# Patient Record
Sex: Female | Born: 1950 | Race: Black or African American | Hispanic: No | Marital: Married | Smoking: Never smoker
Health system: Southern US, Community
[De-identification: ages and names within clinical notes are randomized; demographics above are authoritative.]

## PROBLEM LIST (undated history)

## (undated) DIAGNOSIS — N201 Calculus of ureter: Secondary | ICD-10-CM

## (undated) DIAGNOSIS — Z903 Acquired absence of stomach [part of]: Secondary | ICD-10-CM

## (undated) DIAGNOSIS — Z923 Personal history of irradiation: Secondary | ICD-10-CM

## (undated) DIAGNOSIS — C50919 Malignant neoplasm of unspecified site of unspecified female breast: Secondary | ICD-10-CM

## (undated) DIAGNOSIS — K219 Gastro-esophageal reflux disease without esophagitis: Secondary | ICD-10-CM

## (undated) DIAGNOSIS — Z86010 Personal history of colonic polyps: Secondary | ICD-10-CM

## (undated) DIAGNOSIS — K449 Diaphragmatic hernia without obstruction or gangrene: Secondary | ICD-10-CM

## (undated) DIAGNOSIS — Z8619 Personal history of other infectious and parasitic diseases: Secondary | ICD-10-CM

## (undated) DIAGNOSIS — Z803 Family history of malignant neoplasm of breast: Secondary | ICD-10-CM

## (undated) DIAGNOSIS — R232 Flushing: Secondary | ICD-10-CM

## (undated) DIAGNOSIS — I10 Essential (primary) hypertension: Secondary | ICD-10-CM

## (undated) DIAGNOSIS — Z860101 Personal history of adenomatous and serrated colon polyps: Secondary | ICD-10-CM

## (undated) DIAGNOSIS — Z973 Presence of spectacles and contact lenses: Secondary | ICD-10-CM

## (undated) HISTORY — DX: Flushing: R23.2

## (undated) HISTORY — DX: Essential (primary) hypertension: I10

## (undated) HISTORY — DX: Gastro-esophageal reflux disease without esophagitis: K21.9

## (undated) HISTORY — PX: COLONOSCOPY WITH ESOPHAGOGASTRODUODENOSCOPY (EGD): SHX5779

## (undated) HISTORY — PX: BREAST BIOPSY: SHX20

## (undated) HISTORY — PX: LUMBAR LAMINECTOMY: SHX95

## (undated) HISTORY — DX: Family history of malignant neoplasm of breast: Z80.3

## (undated) HISTORY — PX: BREAST LUMPECTOMY: SHX2

---

## 2003-11-20 HISTORY — PX: CHOLECYSTECTOMY OPEN: SUR202

## 2004-11-19 DIAGNOSIS — Z903 Acquired absence of stomach [part of]: Secondary | ICD-10-CM

## 2004-11-19 HISTORY — PX: GASTRIC RESECTION: SHX5248

## 2004-11-19 HISTORY — DX: Acquired absence of stomach (part of): Z90.3

## 2010-07-06 HISTORY — PX: TRANSTHORACIC ECHOCARDIOGRAM: SHX275

## 2012-08-18 ENCOUNTER — Ambulatory Visit (INDEPENDENT_AMBULATORY_CARE_PROVIDER_SITE_OTHER): Payer: Managed Care, Other (non HMO) | Admitting: Family Medicine

## 2012-08-18 ENCOUNTER — Encounter: Payer: Self-pay | Admitting: Family Medicine

## 2012-08-18 VITALS — BP 124/80 | Temp 98.9°F | Ht 66.0 in | Wt 160.0 lb

## 2012-08-18 DIAGNOSIS — K219 Gastro-esophageal reflux disease without esophagitis: Secondary | ICD-10-CM

## 2012-08-18 DIAGNOSIS — I10 Essential (primary) hypertension: Secondary | ICD-10-CM

## 2012-08-18 DIAGNOSIS — Z23 Encounter for immunization: Secondary | ICD-10-CM

## 2012-08-18 DIAGNOSIS — M549 Dorsalgia, unspecified: Secondary | ICD-10-CM

## 2012-08-18 NOTE — Patient Instructions (Addendum)
-  We have ordered labs (kidney function)  at this visit. It usually takes 1-2 weeks for results and processing. We will contact you with instructions IF your results are abnormal. Normal results will be released to your [facility] in 1-2 weeks. If you have not heard from [place] or can not find your results in [facility] in 2 weeks please contact our office.  -We placed a referral to the gastroenterologist for you as discussed. It usually takes about 1-2 weeks to process and schedule this referral. If you have not heard from [place] regarding this appointment in 2 weeks please contact our office.  We recommend the following healthy lifestyle measures: - eat a healthy diet consisting of lots of vegetables, fruits, beans, nuts, seeds, healthy meats such as white chicken and fish and whole grains.  - avoid fried foods, fast food, processed foods, sodas, red meet and other fattening foods.  - get a least 150 minutes of aerobic exercise per week.    -please follow up in 1-2 months for your women's physical and pap smear

## 2012-08-18 NOTE — Progress Notes (Signed)
Chief Complaint  Patient presents with  . Establish Care    HPI:   Here to establish care. New to area.  History of stomach surgery for cysts in stomach and GERD and wants to establish care with GI here. -on prilosec for gerd -occ reflux still - feels like nexium worked better but was too expensive -no dysphagia, unexplained weight loss, nausea or vomiting  History of chronic back pain with hx of bulging disks and back surgery. -very active, walks 1-2 miles per day -uses medications very sparingly - ibuprofen and 1/2 tab of norco per week  -no weakness, tingling or numbness  Preventive: needs tdap, needs flu Lipids in March at goal per review of records. Hemoglobin A1c 6.0 Exercises daily and tries to eat healthy   ROS: See pertinent positives and negatives per HPI.  Past Medical History  Diagnosis Date  . GERD (gastroesophageal reflux disease)   . Hypertension   . Ruptured lumbar disc   . Chronic back pain     Family History  Problem Relation Age of Onset  . Cancer Mother     breast cancer  . Stroke Father   . Heart disease Maternal Grandmother     History   Social History  . Marital Status: Single    Spouse Name: N/A    Number of Children: N/A  . Years of Education: N/A   Social History Main Topics  . Smoking status: Never Smoker   . Smokeless tobacco: Never Used  . Alcohol Use: 4.2 oz/week    7 Glasses of wine per week     wine every  night   . Drug Use: None  . Sexually Active: No   Other Topics Concern  . None   Social History Narrative  . None    Current outpatient prescriptions:amLODipine (NORVASC) 5 MG tablet, Take 5 mg by mouth daily. , Disp: , Rfl: ;  Biotin 5000 MCG TABS, Take by mouth daily., Disp: , Rfl: ;  cholecalciferol (VITAMIN D) 1000 UNITS tablet, Take 1,000 Units by mouth daily., Disp: , Rfl: ;  HYDROcodone-acetaminophen (NORCO) 10-325 MG per tablet, Take 1 tablet by mouth every 6 (six) hours as needed., Disp: , Rfl:  losartan  (COZAAR) 50 MG tablet, Take 50 mg by mouth daily. , Disp: , Rfl: ;  naproxen (NAPROSYN) 500 MG tablet, Take 500 mg by mouth 2 (two) times daily with a meal. , Disp: , Rfl: ;  omeprazole (PRILOSEC) 40 MG capsule, Take 40 mg by mouth daily. , Disp: , Rfl:   EXAM:  Filed Vitals:   08/18/12 1609  BP: 124/80  Temp: 98.9 F (37.2 C)    Body mass index is 25.82 kg/(m^2).  GENERAL: vitals reviewed and listed below, alert, oriented, appears well hydrated and in no acute distress  HEENT: atraumatic, conjucntiva clear, no obvious abnormalities on inspection of external nose and ears  NECK: no masses on inspection  LUNGS: clear to auscultation bilaterally, no wheezes, rales or rhonchi, good air movement  CV: HRRR, no peripheral edema  MS: moves all extremities without noticeable abnormality  PSYCH: pleasant and cooperative, no obvious depression or anxiety  ASSESSMENT AND PLAN:  Discussed the following assessment and plan:  1. Hypertension  amLODipine (NORVASC) 5 MG tablet, losartan (COZAAR) 50 MG tablet  2. Back pain  -advised PT, but pt wants to hold off on this -pt reports 1/2 norco per week (15 per 6 months)for bad days - no more, discussed that I am ok with  this occ use, but do not recommend chronic frequent use and if needs escalate will need to refer for further management.   3. GERD (gastroesophageal reflux disease)  Ambulatory referral to Gastroenterology   -flu and tdap today -f/u for pap and preventive visit in 1-2 months (refills on meds that visit) Orders Placed This Encounter  Procedures  . Basic metabolic panel  . Ambulatory referral to Gastroenterology    Referral Priority:  Routine    Referral Type:  Consultation    Referral Reason:  Specialty Services Required    Requested Specialty:  Gastroenterology    Number of Visits Requested:  1    Patient Instructions  -We have ordered labs (kidney function)  at this visit. It usually takes 1-2 weeks for results and  processing. We will contact you with instructions IF your results are abnormal. Normal results will be released to your [facility] in 1-2 weeks. If you have not heard from [place] or can not find your results in [facility] in 2 weeks please contact our office.  -We placed a referral to the gastroenterologist for you as discussed. It usually takes about 1-2 weeks to process and schedule this referral. If you have not heard from [place] regarding this appointment in 2 weeks please contact our office.  We recommend the following healthy lifestyle measures: - eat a healthy diet consisting of lots of vegetables, fruits, beans, nuts, seeds, healthy meats such as white chicken and fish and whole grains.  - avoid fried foods, fast food, processed foods, sodas, red meet and other fattening foods.  - get a least 150 minutes of aerobic exercise per week.    -please follow up in 1-2 months for your women's physical and pap smear              Return to clinic immediately if symptoms worsen or persist or new concerns.  Return in about 1 month (around 09/17/2012) for Physical exam - 30 minutes.  Kriste Basque R.

## 2012-08-19 LAB — BASIC METABOLIC PANEL
CO2: 27 mEq/L (ref 19–32)
Chloride: 106 mEq/L (ref 96–112)
Creatinine, Ser: 0.7 mg/dL (ref 0.4–1.2)
Potassium: 3.4 mEq/L — ABNORMAL LOW (ref 3.5–5.1)

## 2012-08-19 NOTE — Progress Notes (Signed)
Quick Note:  Left a message for return call. ______ 

## 2012-08-21 NOTE — Progress Notes (Signed)
Quick Note:  Left a message for return call. ______ 

## 2012-08-22 NOTE — Progress Notes (Signed)
Quick Note:  Left a message for pt to return call. ______ 

## 2012-09-08 ENCOUNTER — Telehealth: Payer: Self-pay

## 2012-09-08 DIAGNOSIS — I10 Essential (primary) hypertension: Secondary | ICD-10-CM

## 2012-09-08 MED ORDER — OMEPRAZOLE 40 MG PO CPDR: 40.0000 mg | DELAYED_RELEASE_CAPSULE | Freq: Every day | ORAL | Status: DC

## 2012-09-08 NOTE — Telephone Encounter (Signed)
Called and spoke with pt and to is aware.  Pt states she will make a follow up appt with Dr. Selena Batten.  Pt states GI called pt and she is going to return call to set up appt with them directly.

## 2012-09-08 NOTE — Telephone Encounter (Signed)
Ok to refill for 6 months. Have her schedule follow up with me in 3-4 months. Please see if she got appointment with GI she requested at last visit. Thanks.

## 2012-09-08 NOTE — Telephone Encounter (Signed)
Rx sent to pharmacy   

## 2012-09-08 NOTE — Telephone Encounter (Signed)
Pt called to get her lab work results and states that she is out of omeprazole.  According to last office note advised pt to follow up in 1 month for physical exam.  Pt states she had a physical exam in  March 2013 before she left [place].  Pls advise on when pt needs to follow up and if omeprazole can be called in to pharmacy.

## 2012-09-08 NOTE — Addendum Note (Signed)
Addended by: Azucena Freed on: 09/08/2012 11:27 AM   Modules accepted: Orders

## 2012-09-08 NOTE — Progress Notes (Signed)
Quick Note:  Called and spoke with pt and pt is aware. ______ 

## 2012-11-07 ENCOUNTER — Other Ambulatory Visit: Payer: Self-pay | Admitting: Family Medicine

## 2012-11-07 DIAGNOSIS — I10 Essential (primary) hypertension: Secondary | ICD-10-CM

## 2012-11-07 MED ORDER — HYDROCODONE-ACETAMINOPHEN 10-325 MG PO TABS: 1.0000 | ORAL_TABLET | Freq: Four times a day (QID) | ORAL | Status: DC | PRN

## 2012-11-07 MED ORDER — NAPROXEN 500 MG PO TABS: 500.0000 mg | ORAL_TABLET | Freq: Two times a day (BID) | ORAL | Status: DC

## 2012-11-07 NOTE — Addendum Note (Signed)
Addended by: Azucena Freed on: 11/07/2012 04:16 PM   Modules accepted: Orders

## 2012-11-07 NOTE — Telephone Encounter (Signed)
Per Dr. Selena Batten ok to call in #30 of naproxen.  Hydrocodone-acetamin called in to pharmacy #15.

## 2012-11-07 NOTE — Telephone Encounter (Signed)
Pt would like refill of naproxen 500mg  (90 day) and HYDROcodone-acetaminophen 10-325mg . (1 every 6 hrs as needed) Pt has NEW pharm, Pls change all scripts to this: [facility] [address], [place], [place] [phone]

## 2012-11-07 NOTE — Telephone Encounter (Signed)
Pls advise on hydrocodone.  Pt seen 08/18/2012

## 2012-11-07 NOTE — Telephone Encounter (Signed)
Pt reported to me she uses 1/2 tablet not more then 1x per week. Can give her # 15 of her Norco, which should last six months. Also needs follow up in next 2-3 months.

## 2012-12-15 ENCOUNTER — Encounter: Payer: Managed Care, Other (non HMO) | Admitting: Family Medicine

## 2012-12-15 NOTE — Progress Notes (Signed)
No show  This encounter was created in error - please disregard.

## 2012-12-16 ENCOUNTER — Encounter: Payer: Managed Care, Other (non HMO) | Admitting: Family Medicine

## 2012-12-16 NOTE — Progress Notes (Signed)
NO SHOW. This encounter was created in error - please disregard.

## 2013-02-04 ENCOUNTER — Other Ambulatory Visit: Payer: Self-pay | Admitting: Family Medicine

## 2013-02-04 NOTE — Telephone Encounter (Signed)
Pt last seen 08/08/2012. Rx for hydrocodone and naproxen filled on 11/07/12.  Pls advise.

## 2013-02-04 NOTE — Telephone Encounter (Signed)
Ok. Thanks!

## 2013-02-04 NOTE — Telephone Encounter (Signed)
Rx for hydrocodone sent to pharmacy.

## 2013-03-06 ENCOUNTER — Other Ambulatory Visit: Payer: Self-pay | Admitting: Family Medicine

## 2013-03-08 NOTE — Telephone Encounter (Signed)
Needs follow up appt. Then refill until that appt. Thanks.

## 2013-03-09 ENCOUNTER — Encounter: Payer: Self-pay | Admitting: Family Medicine

## 2013-03-09 ENCOUNTER — Telehealth: Payer: Self-pay | Admitting: Family Medicine

## 2013-03-09 DIAGNOSIS — I10 Essential (primary) hypertension: Secondary | ICD-10-CM

## 2013-03-09 MED ORDER — LOSARTAN POTASSIUM 50 MG PO TABS: 50.0000 mg | ORAL_TABLET | Freq: Every day | ORAL | Status: DC

## 2013-03-09 MED ORDER — AMLODIPINE BESYLATE 5 MG PO TABS: 5.0000 mg | ORAL_TABLET | Freq: Every day | ORAL | Status: DC

## 2013-03-09 NOTE — Telephone Encounter (Signed)
Rx sent to pharmacy. Per Dr. Elmyra Ricks request called and spoke with pt and a follow up appt is scheduled for 4/28.

## 2013-03-09 NOTE — Telephone Encounter (Signed)
Patient called stating that she need a refill of her omeprazole 40mg  1poqd, and amlodipine 5 mg 1poqd, lorsartan 50 mg 1poqd [facility] [address] [facility] [place]. Please assist as patient is completely out.

## 2013-03-16 ENCOUNTER — Other Ambulatory Visit: Payer: Self-pay

## 2013-03-16 ENCOUNTER — Ambulatory Visit (INDEPENDENT_AMBULATORY_CARE_PROVIDER_SITE_OTHER): Payer: Managed Care, Other (non HMO) | Admitting: Family Medicine

## 2013-03-16 ENCOUNTER — Encounter: Payer: Self-pay | Admitting: Family Medicine

## 2013-03-16 VITALS — BP 118/80 | Temp 98.2°F | Wt 160.0 lb

## 2013-03-16 DIAGNOSIS — K219 Gastro-esophageal reflux disease without esophagitis: Secondary | ICD-10-CM

## 2013-03-16 DIAGNOSIS — G5602 Carpal tunnel syndrome, left upper limb: Secondary | ICD-10-CM

## 2013-03-16 DIAGNOSIS — Z1231 Encounter for screening mammogram for malignant neoplasm of breast: Secondary | ICD-10-CM

## 2013-03-16 DIAGNOSIS — G5600 Carpal tunnel syndrome, unspecified upper limb: Secondary | ICD-10-CM

## 2013-03-16 DIAGNOSIS — I10 Essential (primary) hypertension: Secondary | ICD-10-CM

## 2013-03-16 MED ORDER — LOSARTAN POTASSIUM 50 MG PO TABS: 50.0000 mg | ORAL_TABLET | Freq: Every day | ORAL | Status: DC

## 2013-03-16 MED ORDER — AMLODIPINE BESYLATE 5 MG PO TABS: 5.0000 mg | ORAL_TABLET | Freq: Every day | ORAL | Status: DC

## 2013-03-16 NOTE — Patient Instructions (Addendum)
For Carpal Tunnel: -wear brace every night and during the day when you can  Schedule mammogram  Call to schedule appointment with the gastroenterologist  Follow up in 3 months for you thumb pain and for physical exam

## 2013-03-16 NOTE — Progress Notes (Signed)
Chief Complaint  Patient presents with  . Follow-up    HPI:  Follow up:  HTN: -on amlodipine and losartan -stable: no complaints -denies: CP, SOB, DOE, palpitations -has healthy diet and get regular exercise  Chronic Back pain: -hx bulging disks and back surgery, [place] and doing well -very active -uses norco 1/2 tab very sparingly for bad days - 15 tabs per 6 months  GERD: -also hx of ? Stomach cysts per her report -she was gong to establish with GI here - but then didn't - she will call them -takes prilosec for GERD  L thumb pain and numbness from time to time: -lots of computer work -symptoms worse at night -no weakness, fevers, other symtoms  ROS: See pertinent positives and negatives per HPI.  Past Medical History  Diagnosis Date  . GERD (gastroesophageal reflux disease)   . Hypertension   . Ruptured lumbar disc   . Chronic back pain     Family History  Problem Relation Age of Onset  . Cancer Mother     breast cancer  . Stroke Father   . Heart disease Maternal Grandmother     History   Social History  . Marital Status: Single    Spouse Name: N/A    Number of Children: N/A  . Years of Education: N/A   Social History Main Topics  . Smoking status: Never Smoker   . Smokeless tobacco: Never Used  . Alcohol Use: 4.2 oz/week    7 Glasses of wine per week     Comment: wine every  night   . Drug Use: None  . Sexually Active: No   Other Topics Concern  . None   Social History Narrative  . None    Current outpatient prescriptions:amLODipine (NORVASC) 5 MG tablet, Take 1 tablet (5 mg total) by mouth daily., Disp: 90 tablet, Rfl: 3;  Biotin 5000 MCG TABS, Take by mouth daily., Disp: , Rfl: ;  cholecalciferol (VITAMIN D) 1000 UNITS tablet, Take 1,000 Units by mouth daily., Disp: , Rfl: ;  HYDROcodone-acetaminophen (NORCO) 10-325 MG per tablet, TAKE 1 TABLET BY MOUTH EVERY 6 HOURS AS NEEDED, Disp: 15 tablet, Rfl: 0 losartan (COZAAR) 50 MG tablet, Take 1  tablet (50 mg total) by mouth daily., Disp: 90 tablet, Rfl: 3;  naproxen (NAPROSYN) 500 MG tablet, TAKE 1 TABLET BY MOUTH TWICE A DAY WITH A MEAL, Disp: 30 tablet, Rfl: 0;  omeprazole (PRILOSEC) 40 MG capsule, TAKE 1 CAPSULE (40 MG TOTAL) BY MOUTH DAILY., Disp: 30 capsule, Rfl: 3  EXAM:  Filed Vitals:   03/16/13 1420  BP: 118/80  Temp: 98.2 F (36.8 C)    Body mass index is 25.84 kg/(m^2).  GENERAL: vitals reviewed and listed above, alert, oriented, appears well hydrated and in no acute distress  HEENT: atraumatic, conjunttiva clear, no obvious abnormalities on inspection of external nose and ears  NECK: no obvious masses on inspection  LUNGS: clear to auscultation bilaterally, no wheezes, rales or rhonchi, good air movement  CV: HRRR, no peripheral edema  MS: moves all extremities without noticeable abnormality -tinel and phalen's positive on L - no swelling of wrist, no weakness or numbness of hand or wrist PSYCH: pleasant and cooperative, no obvious depression or anxiety  ASSESSMENT AND PLAN:  Discussed the following assessment and plan:  Hypertension - Plan: losartan (COZAAR) 50 MG tablet, amLODipine (NORVASC) 5 MG tablet  Carpal tunnel syndrome, left  GERD (gastroesophageal reflux disease)  -cock up brace provided and fitted and  applied - HEP and brace at night with follow up in 2 months -refilled BP medications -she will schedule follow up with GI -she will schedule mammo -CPE in 2-3 months, labs then -Patient advised to return or notify a doctor immediately if symptoms worsen or persist or new concerns arise.  Patient Instructions  For Carpal Tunnel: -wear brace every night and during the day when you can  Schedule mammogram  Call to schedule appointment with the gastroenterologist  Follow up in 3 months for you thumb pain and for physical exam     Saulo Anthis R.

## 2013-03-31 ENCOUNTER — Ambulatory Visit
Admission: RE | Admit: 2013-03-31 | Discharge: 2013-03-31 | Disposition: A | Discharge status: Home / Self Care | Payer: Managed Care, Other (non HMO) | Source: Ambulatory Visit

## 2013-03-31 DIAGNOSIS — Z1231 Encounter for screening mammogram for malignant neoplasm of breast: Secondary | ICD-10-CM

## 2013-06-09 ENCOUNTER — Telehealth: Payer: Self-pay | Admitting: Family Medicine

## 2013-06-09 NOTE — Telephone Encounter (Signed)
At last appt she was to have physical in next 2-3 months and looks like missed this. Would advise to schedule this. Ok to refill naprosyn - # 30 to get to appointment.

## 2013-06-09 NOTE — Telephone Encounter (Signed)
Both rx last refilled on 02/04/13.  Pt last seen 03/13/13. Pls advise.

## 2013-06-09 NOTE — Telephone Encounter (Signed)
Pt called to request a refill of her HYDROcodone-acetaminophen (NORCO) 10-325 MG per tablet, and naproxen (NAPROSYN) 500 MG tablet. Please assist.

## 2013-06-10 NOTE — Telephone Encounter (Signed)
Left a message for pt to return call 

## 2013-06-11 MED ORDER — NAPROXEN 500 MG PO TABS: ORAL_TABLET | ORAL | Status: DC

## 2013-06-11 NOTE — Telephone Encounter (Signed)
Left a message for pt to return call 

## 2013-06-11 NOTE — Addendum Note (Signed)
Addended by: Azucena Freed on: 06/11/2013 05:42 PM   Modules accepted: Orders

## 2013-06-11 NOTE — Telephone Encounter (Addendum)
PT called back and scheduled her CPX for 07/03/13  . She appreciates any refills she will get between now and then, considering she is completely out. Please assist.

## 2013-06-11 NOTE — Telephone Encounter (Signed)
Rx for Naproxen sent to pharmacy.

## 2013-06-30 ENCOUNTER — Telehealth: Payer: Self-pay | Admitting: *Deleted

## 2013-06-30 NOTE — Telephone Encounter (Signed)
Dr Jacqulyn Bath? A doctor at [employer], where the patient is employed, is calling to inform Dr Selena Batten that he has refilled the Hydrocodone 10/325 #15.  FYI.

## 2013-07-03 ENCOUNTER — Ambulatory Visit (INDEPENDENT_AMBULATORY_CARE_PROVIDER_SITE_OTHER): Payer: Managed Care, Other (non HMO) | Admitting: Family Medicine

## 2013-07-03 ENCOUNTER — Encounter: Payer: Self-pay | Admitting: Family Medicine

## 2013-07-03 ENCOUNTER — Other Ambulatory Visit (HOSPITAL_COMMUNITY)
Admission: RE | Admit: 2013-07-03 | Discharge: 2013-07-03 | Disposition: A | Discharge status: Home / Self Care | Payer: Managed Care, Other (non HMO) | Source: Ambulatory Visit | Attending: Family Medicine | Admitting: Family Medicine

## 2013-07-03 VITALS — BP 120/76 | Temp 98.4°F | Wt 164.0 lb

## 2013-07-03 DIAGNOSIS — G5600 Carpal tunnel syndrome, unspecified upper limb: Secondary | ICD-10-CM

## 2013-07-03 DIAGNOSIS — K219 Gastro-esophageal reflux disease without esophagitis: Secondary | ICD-10-CM

## 2013-07-03 DIAGNOSIS — Z1151 Encounter for screening for human papillomavirus (HPV): Secondary | ICD-10-CM | POA: Insufficient documentation

## 2013-07-03 DIAGNOSIS — Z0000 Encounter for general adult medical examination without abnormal findings: Secondary | ICD-10-CM

## 2013-07-03 DIAGNOSIS — Z01419 Encounter for gynecological examination (general) (routine) without abnormal findings: Secondary | ICD-10-CM | POA: Insufficient documentation

## 2013-07-03 DIAGNOSIS — G8929 Other chronic pain: Secondary | ICD-10-CM

## 2013-07-03 DIAGNOSIS — M549 Dorsalgia, unspecified: Secondary | ICD-10-CM

## 2013-07-03 DIAGNOSIS — G5602 Carpal tunnel syndrome, left upper limb: Secondary | ICD-10-CM

## 2013-07-03 DIAGNOSIS — I10 Essential (primary) hypertension: Secondary | ICD-10-CM

## 2013-07-03 LAB — BASIC METABOLIC PANEL
BUN: 16 mg/dL (ref 6–23)
CO2: 30 mEq/L (ref 19–32)
Chloride: 104 mEq/L (ref 96–112)
Creatinine, Ser: 0.7 mg/dL (ref 0.4–1.2)
Potassium: 3.4 mEq/L — ABNORMAL LOW (ref 3.5–5.1)

## 2013-07-03 LAB — LIPID PANEL
HDL: 50 mg/dL (ref >39.00)
Total CHOL/HDL Ratio: 3
Triglycerides: 126 mg/dL (ref 0.0–149.0)

## 2013-07-03 MED ORDER — OMEPRAZOLE 40 MG PO CPDR: DELAYED_RELEASE_CAPSULE | ORAL | Status: DC

## 2013-07-03 MED ORDER — LOSARTAN POTASSIUM 50 MG PO TABS: 50.0000 mg | ORAL_TABLET | Freq: Every day | ORAL | Status: DC

## 2013-07-03 MED ORDER — AMLODIPINE BESYLATE 5 MG PO TABS: 5.0000 mg | ORAL_TABLET | Freq: Every day | ORAL | Status: DC

## 2013-07-03 MED ORDER — NAPROXEN 500 MG PO TABS: ORAL_TABLET | ORAL | Status: AC

## 2013-07-03 MED ORDER — HYDROCODONE-ACETAMINOPHEN 5-325 MG PO TABS: ORAL_TABLET | ORAL | Status: AC

## 2013-07-03 NOTE — Patient Instructions (Addendum)
-  We have ordered labs or studies at this visit. It can take up to 1-2 weeks for results and processing. We will contact you with instructions IF your results are abnormal. Normal results will be released to your [facility]. If you have not heard from [place] or can not find your results in [facility] in 2 weeks please contact our office.  -PLEASE SIGN UP FOR MYCHART TODAY   We recommend the following healthy lifestyle measures: - eat a healthy diet consisting of lots of vegetables, fruits, beans, nuts, seeds, healthy meats such as white chicken and fish and whole grains.  - avoid fried foods, fast food, processed foods, sodas, red meet and other fattening foods.  - get a least 150 minutes of aerobic exercise per week.   Have your colonoscopy report sent to our office  Check on cost of zostavax with your insurance company and schedule with Elease Hashimoto if you wish to do this vaccine  Follow up in: 3-4 months

## 2013-07-03 NOTE — Progress Notes (Signed)
Chief Complaint  Patient presents with  . Annual Exam    HPI:  Here for CPE:  -Concerns today: none  1)Chronic back pain: -hx DDD s/p surgery -was using norco 1/2 tablet sparingly for bad days; I had told her if narcotic use escalated would have her see pain specialist; norco about 30 tabs in 6 months. Dr. Jacqulyn Bath refilled #15 for her on 06/30/13. Takes naprosyn - about 5 times per month. -reports: fine with 30 of the norco and 30 naprosyn for next 6 months -denies:radiation, numbness, weakness, bowel or bladder dysfunction, falls  2)HTN: -stable on amlodipine and losartan  3)GERD: -on prilosec -? Hx of stomach cyst and was to establish with GI after last visit  4) CTS L -cock up brace at last visit  -Diet: variety of foods, balance and well rounded  -Taking Vit D and Calcium: Vit D 1000 IU daily, and calcium  -Exercise: walks daily  -Diabetes and Dyslipidemia Screening:  -Hx of HTN:yes treated  -Vaccines: has not had zostavax  -pap history: can not remember last pap, all normal  -FDLMP: postmenopausal  -sexual activity: yes, female partner, no new partners  -wants STI testing: no  -FH breast, colon or ovarian ca: see FH, health maintenance section - needs pap smear and colon cancer screening -thinks had colonoscopy in last 5 years and normal  -Alcohol, Tobacco, drug use: see social history  Review of Systems - Review of Systems  Constitutional: Negative for fever and weight loss.  HENT: Negative for hearing loss.   Eyes: Negative for blurred vision.  Respiratory: Negative for shortness of breath.   Cardiovascular: Negative for chest pain, palpitations and leg swelling.  Gastrointestinal: Negative for heartburn, abdominal pain and blood in stool.  Genitourinary: Negative for dysuria.  Musculoskeletal: Positive for myalgias and back pain. Negative for falls.  Neurological: Negative for dizziness, tingling, focal weakness and headaches.  Endo/Heme/Allergies: Does  not bruise/bleed easily.  Psychiatric/Behavioral: Negative for depression and memory loss.     Past Medical History  Diagnosis Date  . GERD (gastroesophageal reflux disease)   . Hypertension   . Ruptured lumbar disc   . Chronic back pain     Family History  Problem Relation Age of Onset  . Cancer Mother     breast cancer  . Stroke Father   . Heart disease Maternal Grandmother     History   Social History  . Marital Status: Single    Spouse Name: N/A    Number of Children: N/A  . Years of Education: N/A   Social History Main Topics  . Smoking status: Never Smoker   . Smokeless tobacco: Never Used  . Alcohol Use: 4.2 oz/week    7 Glasses of wine per week     Comment: wine every  night   . Drug Use: None  . Sexual Activity: No   Other Topics Concern  . None   Social History Narrative  . None    Current outpatient prescriptions:amLODipine (NORVASC) 5 MG tablet, Take 1 tablet (5 mg total) by mouth daily., Disp: 90 tablet, Rfl: 3;  Biotin 5000 MCG TABS, Take by mouth daily., Disp: , Rfl: ;  cholecalciferol (VITAMIN D) 1000 UNITS tablet, Take 1,000 Units by mouth daily., Disp: , Rfl: ;  losartan (COZAAR) 50 MG tablet, Take 1 tablet (50 mg total) by mouth daily., Disp: 90 tablet, Rfl: 3 omeprazole (PRILOSEC) 40 MG capsule, TAKE 1 CAPSULE (40 MG TOTAL) BY MOUTH DAILY., Disp: 30 capsule, Rfl: 3;  HYDROcodone-acetaminophen (NORCO) 5-325 MG per tablet, Take one tablet by mouth on bad days for pain., Disp: 30 tablet, Rfl: 0;  naproxen (NAPROSYN) 500 MG tablet, Use sparingly, at most 2 times per week for pain., Disp: 60 tablet, Rfl: 0  EXAM:  Filed Vitals:   07/03/13 0819  BP: 120/76  Temp: 98.4 F (36.9 C)    GENERAL: vitals reviewed and listed below, alert, oriented, appears well hydrated and in no acute distress  HEENT: head atraumatic, PERRLA, normal appearance of eyes, ears, nose and mouth. moist mucus membranes.  NECK: supple, no masses or  lymphadenopathy  LUNGS: clear to auscultation bilaterally, no rales, rhonchi or wheeze  CV: HRRR, no peripheral edema or cyanosis, normal pedal pulses  BREAST: normal appearance - no lesions or discharge, on palpation normal breast tissue without any suspicious masses  ABDOMEN: bowel sounds normal, soft, non tender to palpation, no masses, no rebound or guarding  GU: normal appearance of external genitalia - no lesions or masses, normal vaginal mucosa - no abnormal discharge, normal appearance of cervix - no lesions or abnormal discharge, no masses or tenderness on palpation of uterus and ovaries.  RECTAL: refused  SKIN: no rash or abnormal lesions  MS: normal gait, moves all extremities normally  NEURO: CN II-XII grossly intact, normal muscle strength and sensation to light touch on extremities  PSYCH: normal affect, pleasant and cooperative  ASSESSMENT AND PLAN:  Discussed the following assessment and plan:  Hypertension - Plan: losartan (COZAAR) 50 MG tablet, amLODipine (NORVASC) 5 MG tablet -stable, meds refilled  Back pain, chronic, DDD, s/p back surgery in the past (2010, 2001 in [place] at [facility], Dr. Dudley Major) - Plan: naproxen (NAPROSYN) 500 MG tablet, HYDROcodone-acetaminophen (NORCO) 5-325 MG per tablet -congratulated on exercise -using these medications sparingly on bad days to improve function - discussed risks, refilled for 60 months and discussed if escalating doses will have her see pain managment  GERD (gastroesophageal reflux disease) -stable  CTS (carpal tunnel syndrome), left -resolved with cock up brace  -Discussed and advised all [place] preventive services health task force level A and B recommendations for age, sex and risks.  -Advised at least 150 minutes of exercise per week and a healthy diet low in saturated fats and sweets and consisting of fresh fruits and vegetables, lean meats such as fish and white chicken and whole  grains.  -pap smear  -advised on zostavax, colon cancer screening, cervical cancer screening  -FASTING labs, studies and vaccines per orders this encounter  Orders Placed This Encounter  Procedures  . Lipid Panel  . Hemoglobin A1c  . Basic metabolic panel    Patient Instructions  -We have ordered labs or studies at this visit. It can take up to 1-2 weeks for results and processing. We will contact you with instructions IF your results are abnormal. Normal results will be released to your [facility]. If you have not heard from [place] or can not find your results in [facility] in 2 weeks please contact our office.  -PLEASE SIGN UP FOR MYCHART TODAY   We recommend the following healthy lifestyle measures: - eat a healthy diet consisting of lots of vegetables, fruits, beans, nuts, seeds, healthy meats such as white chicken and fish and whole grains.  - avoid fried foods, fast food, processed foods, sodas, red meet and other fattening foods.  - get a least 150 minutes of aerobic exercise per week.   Have your colonoscopy report sent to our office  Check on cost of zostavax with your insurance company and schedule with Elease Hashimoto if you wish to do this vaccine  Follow up in: 3-4 months     Patient advised to return to clinic immediately if symptoms worsen or persist or new concerns.  @[email or web address] @  No Follow-up on file.  Kriste Basque R.

## 2013-07-07 NOTE — Progress Notes (Signed)
Quick Note:  Left a message for return call. ______ 

## 2013-07-09 NOTE — Progress Notes (Signed)
Quick Note:  Left a message for pt. ______

## 2013-07-23 NOTE — Progress Notes (Signed)
Quick Note:  Left a message for pt to return call. ______ 

## 2013-09-02 ENCOUNTER — Ambulatory Visit (INDEPENDENT_AMBULATORY_CARE_PROVIDER_SITE_OTHER): Payer: Managed Care, Other (non HMO) | Admitting: Internal Medicine

## 2013-09-02 ENCOUNTER — Encounter: Payer: Self-pay | Admitting: Internal Medicine

## 2013-09-02 VITALS — BP 133/81 | HR 67 | Temp 97.9°F | Wt 164.6 lb

## 2013-09-02 DIAGNOSIS — J069 Acute upper respiratory infection, unspecified: Secondary | ICD-10-CM

## 2013-09-02 MED ORDER — AMOXICILLIN 500 MG PO CAPS: 1000.0000 mg | ORAL_CAPSULE | Freq: Two times a day (BID) | ORAL | Status: DC

## 2013-09-02 MED ORDER — GUAIFENESIN-CODEINE 100-10 MG/5ML PO SOLN: 5.0000 mL | Freq: Every evening | ORAL | Status: DC | PRN

## 2013-09-02 NOTE — Progress Notes (Signed)
  Subjective:    Patient ID: Krista Armstrong, female    DOB: [birth year], 62 y.o.   MRN: [id number]  HPI Acute visit Started with cough 5 days ago, + mild  global headache she thinks related to cough. Had sore throat as well, sore throat is resolved. Cough interfere with sleeping sometimes. Taking OTC Mucinex dm  Past Medical History  Diagnosis Date  . GERD (gastroesophageal reflux disease)   . Hypertension   . Ruptured lumbar disc   . Chronic back pain    Past Surgical History  Procedure Laterality Date  . Back surgery  2000, 2009  . Stomach surgery  2006   History   Social History  . Marital Status: Single    Spouse Name: N/A    Number of Children: N/A  . Years of Education: N/A   Occupational History  . Not on file.   Social History Main Topics  . Smoking status: Never Smoker   . Smokeless tobacco: Never Used  . Alcohol Use: 4.2 oz/week    7 Glasses of wine per week     Comment: wine every  night   . Drug Use: Not on file  . Sexual Activity: No   Other Topics Concern  . Not on file   Social History Narrative  . No narrative on file    Review of Systems No fever or chills No nausea, vomiting, diarrhea No recent travels outside [place] Mild chest congestion, no wheezing. No myalgias or oncologist.     Objective:   Physical Exam BP 133/81  Pulse 67  Temp(Src) 97.9 F (36.6 C)  Wt 164 lb 9.6 oz (74.662 kg)  BMI 26.58 kg/m2  SpO2 99% General -- alert, well-developed, NAD.  HEENT-- Not pale. TMs normal, throat symmetric, no redness or discharge. Face symmetric, sinuses not tender to palpation. Nose slt  congested. Lungs -- normal respiratory effort, no intercostal retractions, no accessory muscle use, and normal breath sounds.  Heart-- normal rate, regular rhythm, no murmur.  Extremities-- no pretibial edema bilaterally  Neurologic--  alert & oriented X3. Speech normal, gait normal, strength normal in all extremities.  Psych-- Cognition and  judgment appear intact. Cooperative with normal attention span and concentration. No anxious appearing , no depressed appearing.      Assessment & Plan:  URI, mild bronchitis?. See instructions

## 2013-09-02 NOTE — Patient Instructions (Signed)
Rest, fluids , tylenol For cough, take Mucinex DM twice a day as needed  Cough continue at night, take Codeine, will make you sleepy, don't mix it with other pain medications. Take the antibiotic as prescribed  (Amoxicillin) if no better in few days Call if no better in few days Call anytime if the symptoms are severe

## 2013-12-28 ENCOUNTER — Ambulatory Visit (INDEPENDENT_AMBULATORY_CARE_PROVIDER_SITE_OTHER): Payer: 59 | Admitting: Family Medicine

## 2013-12-28 ENCOUNTER — Encounter: Payer: Self-pay | Admitting: Family Medicine

## 2013-12-28 VITALS — BP 120/86 | HR 92 | Temp 98.3°F | Wt 167.0 lb

## 2013-12-28 DIAGNOSIS — J988 Other specified respiratory disorders: Secondary | ICD-10-CM

## 2013-12-28 MED ORDER — AZITHROMYCIN 250 MG PO TABS: ORAL_TABLET | ORAL | Status: DC

## 2013-12-28 MED ORDER — HYDROCOD POLST-CHLORPHEN POLST 10-8 MG/5ML PO LQCR: 5.0000 mL | Freq: Every evening | ORAL | Status: DC | PRN

## 2013-12-28 NOTE — Progress Notes (Signed)
Pre visit review using our clinic review tool, if applicable. No additional management support is needed unless otherwise documented below in the visit note. 

## 2013-12-28 NOTE — Progress Notes (Signed)
Chief Complaint  Patient presents with  . Bronchitis    HPI:  -started: 4 days ago, but cough for a few weeks -symptoms:nasal congestion, sore throat, cough, malaise -denies:fever, SOB, NVD, tooth pain -sick contacts/travel/risks: denies Ebola risks, folks at work with the flu  ROS: See pertinent positives and negatives per HPI.  Past Medical History  Diagnosis Date  . GERD (gastroesophageal reflux disease)   . Hypertension   . Ruptured lumbar disc   . Chronic back pain     Past Surgical History  Procedure Laterality Date  . Back surgery  2000, 2009  . Stomach surgery  2006    Family History  Problem Relation Age of Onset  . Cancer Mother     breast cancer  . Stroke Father   . Heart disease Maternal Grandmother     History   Social History  . Marital Status: Single    Spouse Name: N/A    Number of Children: N/A  . Years of Education: N/A   Social History Main Topics  . Smoking status: Never Smoker   . Smokeless tobacco: Never Used  . Alcohol Use: 4.2 oz/week    7 Glasses of wine per week     Comment: wine every  night   . Drug Use: None  . Sexual Activity: No   Other Topics Concern  . None   Social History Narrative  . None    Current outpatient prescriptions:Esomeprazole Magnesium (NEXIUM 24HR PO), Take by mouth daily., Disp: , Rfl: ;  amLODipine (NORVASC) 5 MG tablet, Take 1 tablet (5 mg total) by mouth daily., Disp: 90 tablet, Rfl: 3;  azithromycin (ZITHROMAX) 250 MG tablet, 2 tabs on first day and then one tab daily for 4 days, Disp: 6 tablet, Rfl: 0;  Biotin 5000 MCG TABS, Take by mouth daily., Disp: , Rfl:  chlorpheniramine-HYDROcodone (TUSSIONEX PENNKINETIC ER) 10-8 MG/5ML LQCR, Take 5 mLs by mouth at bedtime as needed for cough., Disp: 115 mL, Rfl: 0;  cholecalciferol (VITAMIN D) 1000 UNITS tablet, Take 1,000 Units by mouth daily., Disp: , Rfl: ;  HYDROcodone-acetaminophen (NORCO) 5-325 MG per tablet, Take one tablet by mouth on bad days for pain.,  Disp: 30 tablet, Rfl: 0 losartan (COZAAR) 50 MG tablet, Take 1 tablet (50 mg total) by mouth daily., Disp: 90 tablet, Rfl: 3;  naproxen (NAPROSYN) 500 MG tablet, Use sparingly, at most 2 times per week for pain., Disp: 60 tablet, Rfl: 0  EXAM:  Filed Vitals:   12/28/13 1551  BP: 120/86  Pulse: 92  Temp: 98.3 F (36.8 C)    Body mass index is 26.97 kg/(m^2).  GENERAL: vitals reviewed and listed above, alert, oriented, appears well hydrated and in no acute distress  HEENT: atraumatic, conjunttiva clear, no obvious abnormalities on inspection of external nose and ears, normal appearance of ear canals and TMs, clear nasal congestion, mild post oropharyngeal erythema with PND, no tonsillar edema or exudate, no sinus TTP  NECK: no obvious masses on inspection  LUNGS: course breath sounds R base  CV: HRRR, no peripheral edema  MS: moves all extremities without noticeable abnormality  PSYCH: pleasant and cooperative, no obvious depression or anxiety  ASSESSMENT AND PLAN:  Discussed the following assessment and plan:  Respiratory infection - Plan: azithromycin (ZITHROMAX) 250 MG tablet, chlorpheniramine-HYDROcodone (TUSSIONEX PENNKINETIC ER) 10-8 MG/5ML LQCR  -we discussed possible serious and likely etiologies, workup and treatment, treatment risks and return precautions -after this discussion, Ivonne opted for abx for possible cap, cough  medication -of course, we advised Farrie  to return or notify a doctor immediately if symptoms worsen or persist or new concerns arise.     There are no Patient Instructions on file for this visit.   Colin Benton R.

## 2014-01-08 ENCOUNTER — Encounter: Payer: Self-pay | Admitting: Family Medicine

## 2014-01-08 ENCOUNTER — Ambulatory Visit (INDEPENDENT_AMBULATORY_CARE_PROVIDER_SITE_OTHER): Payer: 59 | Admitting: Family Medicine

## 2014-01-08 VITALS — BP 120/94 | HR 96 | Temp 98.1°F | Wt 172.0 lb

## 2014-01-08 DIAGNOSIS — R059 Cough, unspecified: Secondary | ICD-10-CM

## 2014-01-08 DIAGNOSIS — J988 Other specified respiratory disorders: Secondary | ICD-10-CM

## 2014-01-08 DIAGNOSIS — M549 Dorsalgia, unspecified: Secondary | ICD-10-CM

## 2014-01-08 DIAGNOSIS — R05 Cough: Secondary | ICD-10-CM

## 2014-01-08 MED ORDER — HYDROCOD POLST-CHLORPHEN POLST 10-8 MG/5ML PO LQCR: 5.0000 mL | Freq: Every evening | ORAL | Status: DC | PRN

## 2014-01-08 NOTE — Progress Notes (Signed)
Chief Complaint  Patient presents with  . 1 week rov    still coughing   . Back Pain    HPI:  Follow up resp infection: -seen 2/9 with upper resp symptoms, flu like symptoms and course lung sounds and treated with azithromycin and cough medication -reports: doing "so much better" but cough persists some at night -denies: sob, wheezing, fevers, fatigue  Chronic back pain: -hx of DDD and surgery 4 years ago -has had flare of pain with all of the coughing - lumbar, worse with bending and with wearing heals -better with naprosyn - not better with norco -deneis: fevers, weakness, numbness, radiation  ROS: See pertinent positives and negatives per HPI.  Past Medical History  Diagnosis Date  . GERD (gastroesophageal reflux disease)   . Hypertension   . Ruptured lumbar disc   . Chronic back pain     Past Surgical History  Procedure Laterality Date  . Back surgery  2000, 2009  . Stomach surgery  2006    Family History  Problem Relation Age of Onset  . Cancer Mother     breast cancer  . Stroke Father   . Heart disease Maternal Grandmother     History   Social History  . Marital Status: Single    Spouse Name: N/A    Number of Children: N/A  . Years of Education: N/A   Social History Main Topics  . Smoking status: Never Smoker   . Smokeless tobacco: Never Used  . Alcohol Use: 4.2 oz/week    7 Glasses of wine per week     Comment: wine every  night   . Drug Use: None  . Sexual Activity: No   Other Topics Concern  . None   Social History Narrative  . None    Current outpatient prescriptions:amLODipine (NORVASC) 5 MG tablet, Take 1 tablet (5 mg total) by mouth daily., Disp: 90 tablet, Rfl: 3;  azithromycin (ZITHROMAX) 250 MG tablet, 2 tabs on first day and then one tab daily for 4 days, Disp: 6 tablet, Rfl: 0;  Biotin 5000 MCG TABS, Take by mouth daily., Disp: , Rfl:  chlorpheniramine-HYDROcodone (TUSSIONEX PENNKINETIC ER) 10-8 MG/5ML LQCR, Take 5 mLs by mouth at  bedtime as needed for cough., Disp: 115 mL, Rfl: 0;  cholecalciferol (VITAMIN D) 1000 UNITS tablet, Take 1,000 Units by mouth daily., Disp: , Rfl: ;  Esomeprazole Magnesium (NEXIUM 24HR PO), Take by mouth daily., Disp: , Rfl: ;  losartan (COZAAR) 50 MG tablet, Take 1 tablet (50 mg total) by mouth daily., Disp: 90 tablet, Rfl: 3  EXAM:  Filed Vitals:   01/08/14 1335  BP: 120/94  Pulse: 96  Temp: 98.1 F (36.7 C)    Body mass index is 27.77 kg/(m^2).  GENERAL: vitals reviewed and listed above, alert, oriented, appears well hydrated and in no acute distress  HEENT: atraumatic, conjunttiva clear, no obvious abnormalities on inspection of external nose and ears, PND  NECK: no obvious masses on inspection  LUNGS: clear to auscultation bilaterally, no wheezes, rales or rhonchi, good air movement  CV: HRRR, no peripheral edema  MS: moves all extremities without noticeable abnormality Normal Gait Normal inspection of back, no obvious scoliosis or leg length descrepancy No bony TTP Soft tissue TTP at: R lumbar paraspinal muscle TTP -/+ tests: neg trendelenburg,-facet loading, -SLRT, -CLRT, -FABER, -FADIR Normal muscle strength, sensation to light touch and DTRs in LEs bilaterally  PSYCH: pleasant and cooperative, no obvious depression or anxiety  ASSESSMENT AND  PLAN:  Discussed the following assessment and plan:  Back pain -flare with coughing, no alarm features or neurodeficits on exam -likely muscle strain or DDD -opted for conservative tx, naprosyn, HEP, follow up if persists and in 1 month  Cough - Plan: chlorpheniramine-HYDROcodone (TUSSIONEX PENNKINETIC ER) 10-8 MG/5ML LQCR -likely post infectious, doing much better -discussed options and opted for refill of cough medication and follow up if persists  Respiratory infection - Plan: chlorpheniramine-HYDROcodone (TUSSIONEX PENNKINETIC ER) 10-8 MG/5ML LQCR  -Patient advised to return or notify a doctor immediately if symptoms  worsen or persist or new concerns arise.  Patient Instructions  -Home Exercised program  -naprosyn up to 2 times daily  -call if back pain persists and you want to see a back doctor  -follow up if cough persists      KIM, HANNAH R.

## 2014-01-08 NOTE — Patient Instructions (Signed)
-  Home Exercised program  -naprosyn up to 2 times daily  -call if back pain persists and you want to see a back doctor  -follow up if cough persists

## 2014-01-08 NOTE — Progress Notes (Signed)
Pre visit review using our clinic review tool, if applicable. No additional management support is needed unless otherwise documented below in the visit note. 

## 2014-02-03 ENCOUNTER — Other Ambulatory Visit: Payer: Self-pay

## 2014-02-03 DIAGNOSIS — Z1231 Encounter for screening mammogram for malignant neoplasm of breast: Secondary | ICD-10-CM

## 2014-02-28 ENCOUNTER — Other Ambulatory Visit: Payer: Self-pay | Admitting: Family Medicine

## 2014-03-15 ENCOUNTER — Telehealth: Payer: Self-pay | Admitting: Family Medicine

## 2014-03-15 NOTE — Telephone Encounter (Signed)
Pt needs new rx hydrocodone °

## 2014-03-15 NOTE — Telephone Encounter (Signed)
Can we get more information?  If for her chronic back pain - she told me at last visit that naproxen worked better - would she prefer that? If so, rx naproxen 500mg , q12 hours prn pain, #60, 0 refills. At visit in feb advised follow up in 1 month if home exercises and naproxen not working for back pain - needs appt if this is the case or we can put referral to back doctor as we had discussed. Thanks.

## 2014-03-16 ENCOUNTER — Other Ambulatory Visit: Payer: Self-pay

## 2014-03-16 DIAGNOSIS — Z0279 Encounter for issue of other medical certificate: Secondary | ICD-10-CM

## 2014-03-16 DIAGNOSIS — J988 Other specified respiratory disorders: Secondary | ICD-10-CM

## 2014-03-16 DIAGNOSIS — R059 Cough, unspecified: Secondary | ICD-10-CM

## 2014-03-16 DIAGNOSIS — R05 Cough: Secondary | ICD-10-CM

## 2014-03-16 MED ORDER — HYDROCOD POLST-CHLORPHEN POLST 10-8 MG/5ML PO LQCR: 5.0000 mL | Freq: Every evening | ORAL | Status: DC | PRN

## 2014-03-17 ENCOUNTER — Other Ambulatory Visit: Payer: Self-pay

## 2014-03-17 MED ORDER — HYDROCODONE-ACETAMINOPHEN 5-325 MG PO TABS: 1.0000 | ORAL_TABLET | Freq: Four times a day (QID) | ORAL | Status: DC | PRN

## 2014-03-17 NOTE — Telephone Encounter (Signed)
norco [phone] printed, signed and sent to front desk for pt pickup

## 2014-04-02 ENCOUNTER — Ambulatory Visit: Payer: 59

## 2014-04-05 ENCOUNTER — Ambulatory Visit
Admission: RE | Admit: 2014-04-05 | Discharge: 2014-04-05 | Disposition: A | Discharge status: Home / Self Care | Payer: 59 | Source: Ambulatory Visit

## 2014-04-05 ENCOUNTER — Encounter (INDEPENDENT_AMBULATORY_CARE_PROVIDER_SITE_OTHER): Payer: Self-pay

## 2014-04-05 DIAGNOSIS — Z1231 Encounter for screening mammogram for malignant neoplasm of breast: Secondary | ICD-10-CM

## 2014-07-05 ENCOUNTER — Encounter: Payer: Self-pay | Admitting: Gastroenterology

## 2014-07-07 ENCOUNTER — Other Ambulatory Visit: Payer: Self-pay | Admitting: Family Medicine

## 2014-07-14 ENCOUNTER — Other Ambulatory Visit: Payer: Self-pay | Admitting: Family Medicine

## 2014-07-19 ENCOUNTER — Encounter: Payer: Self-pay | Admitting: Family Medicine

## 2014-07-19 ENCOUNTER — Ambulatory Visit (INDEPENDENT_AMBULATORY_CARE_PROVIDER_SITE_OTHER): Payer: 59 | Admitting: Family Medicine

## 2014-07-19 VITALS — BP 116/82 | HR 76 | Temp 98.8°F | Ht 66.0 in | Wt 167.5 lb

## 2014-07-19 DIAGNOSIS — K219 Gastro-esophageal reflux disease without esophagitis: Secondary | ICD-10-CM

## 2014-07-19 DIAGNOSIS — M545 Low back pain, unspecified: Secondary | ICD-10-CM

## 2014-07-19 DIAGNOSIS — I10 Essential (primary) hypertension: Secondary | ICD-10-CM

## 2014-07-19 DIAGNOSIS — Z23 Encounter for immunization: Secondary | ICD-10-CM

## 2014-07-19 MED ORDER — AMLODIPINE BESYLATE 5 MG PO TABS: 5.0000 mg | ORAL_TABLET | Freq: Every day | ORAL | Status: DC

## 2014-07-19 MED ORDER — NAPROXEN 500 MG PO TABS: 500.0000 mg | ORAL_TABLET | Freq: Two times a day (BID) | ORAL | Status: DC

## 2014-07-19 MED ORDER — LOSARTAN POTASSIUM 50 MG PO TABS: 50.0000 mg | ORAL_TABLET | Freq: Every day | ORAL | Status: DC

## 2014-07-19 NOTE — Patient Instructions (Signed)
-  use as little of the naproxen as possible during flares - let us know if back is getting worse or new concerns  -schedule Physical Exam

## 2014-07-19 NOTE — Addendum Note (Signed)
Addended by: Agnes Lawrence on: 07/19/2014 02:27 PM   Modules accepted: Orders

## 2014-07-19 NOTE — Progress Notes (Signed)
No chief complaint on file.   HPI:  HTN: -meds: norvasc, losartan -denies: CP, SOB, HA, palpitation  Back Pain: -chronic, hx DDD -bilat lumbar muscle pain -uses naproxen intermittently for this -denies: weakness, numbness, radiation, bowel or bladder dysfunction, sometimes feels a little numbness in post R leg since surgery about 5 years ago - but mild -has several flares per year and last about 1-2 weeks -no issues today  GERD: -on PPI but this is not helping as much -seeing GI in October -denies: dysphagia, weight loss, nausea or vomiting   ROS: See pertinent positives and negatives per HPI.  Past Medical History  Diagnosis Date  . GERD (gastroesophageal reflux disease)   . Hypertension   . Ruptured lumbar disc   . Chronic back pain     Past Surgical History  Procedure Laterality Date  . Back surgery  2000, 2009  . Stomach surgery  2006    Family History  Problem Relation Age of Onset  . Cancer Mother     breast cancer  . Stroke Father   . Heart disease Maternal Grandmother     History   Social History  . Marital Status: Single    Spouse Name: N/A    Number of Children: N/A  . Years of Education: N/A   Social History Main Topics  . Smoking status: Never Smoker   . Smokeless tobacco: Never Used  . Alcohol Use: 4.2 oz/week    7 Glasses of wine per week     Comment: wine every  night   . Drug Use: None  . Sexual Activity: No   Other Topics Concern  . None   Social History Narrative  . None    Current outpatient prescriptions:amLODipine (NORVASC) 5 MG tablet, Take 1 tablet (5 mg total) by mouth daily., Disp: 90 tablet, Rfl: 3;  Biotin 5000 MCG TABS, Take by mouth daily., Disp: , Rfl: ;  cholecalciferol (VITAMIN D) 1000 UNITS tablet, Take 1,000 Units by mouth daily., Disp: , Rfl: ;  losartan (COZAAR) 50 MG tablet, Take 1 tablet (50 mg total) by mouth daily., Disp: 90 tablet, Rfl: 3 omeprazole (PRILOSEC) 40 MG capsule, TAKE ONE CAPSULE BY MOUTH  DAILY, Disp: 30 capsule, Rfl: 3;  naproxen (NAPROSYN) 500 MG tablet, Take 1 tablet (500 mg total) by mouth 2 (two) times daily with a meal., Disp: 30 tablet, Rfl: 1  EXAM:  Filed Vitals:   07/19/14 1349  BP: 116/82  Pulse: 76  Temp: 98.8 F (37.1 C)    Body mass index is 27.05 kg/(m^2).  GENERAL: vitals reviewed and listed above, alert, oriented, appears well hydrated and in no acute distress  HEENT: atraumatic, conjunttiva clear, no obvious abnormalities on inspection of external nose and ears  NECK: no obvious masses on inspection  LUNGS: clear to auscultation bilaterally, no wheezes, rales or rhonchi, good air movement  CV: HRRR, no peripheral edema  MS: moves all extremities without noticeable abnormality -normal gait -TTP R>L lumbar paraspinal muscles -neg SLRT and CLRT -normal muscle strength and sensation in LEs bilat  PSYCH: pleasant and cooperative, no obvious depression or anxiety  ASSESSMENT AND PLAN:  Discussed the following assessment and plan:  Bilateral low back pain without sciatica - Plan: naproxen (NAPROSYN) 500 MG tablet  Essential hypertension - Plan: losartan (COZAAR) 50 MG tablet, amLODipine (NORVASC) 5 MG tablet  Gastroesophageal reflux disease without esophagitis  -Patient advised to return or notify a doctor immediately if symptoms worsen or persist or new concerns arise.  Patient Instructions  -use as little of the naproxen as possible during flares - let us know if back is getting worse or new concerns  -schedule Physical Exam       KIM, HANNAH R.

## 2014-07-19 NOTE — Progress Notes (Signed)
Pre visit review using our clinic review tool, if applicable. No additional management support is needed unless otherwise documented below in the visit note. 

## 2014-07-28 ENCOUNTER — Other Ambulatory Visit: Payer: Self-pay | Admitting: Family Medicine

## 2014-09-06 ENCOUNTER — Ambulatory Visit: Payer: 59 | Admitting: Gastroenterology

## 2014-10-19 ENCOUNTER — Ambulatory Visit (INDEPENDENT_AMBULATORY_CARE_PROVIDER_SITE_OTHER): Payer: 59 | Admitting: Family Medicine

## 2014-10-19 ENCOUNTER — Encounter: Payer: Self-pay | Admitting: Family Medicine

## 2014-10-19 VITALS — BP 122/82 | HR 83 | Temp 98.3°F | Ht 66.5 in | Wt 167.2 lb

## 2014-10-19 DIAGNOSIS — I10 Essential (primary) hypertension: Secondary | ICD-10-CM

## 2014-10-19 DIAGNOSIS — Z0000 Encounter for general adult medical examination without abnormal findings: Secondary | ICD-10-CM

## 2014-10-19 DIAGNOSIS — K219 Gastro-esophageal reflux disease without esophagitis: Secondary | ICD-10-CM

## 2014-10-19 DIAGNOSIS — R739 Hyperglycemia, unspecified: Secondary | ICD-10-CM

## 2014-10-19 LAB — HEMOGLOBIN A1C: HEMOGLOBIN A1C: 6.3 % (ref 4.6–6.5)

## 2014-10-19 MED ORDER — OMEPRAZOLE 40 MG PO CPDR: 40.0000 mg | DELAYED_RELEASE_CAPSULE | Freq: Every day | ORAL | Status: DC

## 2014-10-19 NOTE — Progress Notes (Signed)
HPI:  Here for CPE:  -Concerns and/or follow up today:   HTN: -meds: norvasc, losartan -denies: CP, SOB, HA, palpitation  GERD: -on PPI but still has symptoms - wants refill -seeing GI in in a few weeks -denies: dysphagia, weight loss, nausea or vomiting -drinks wine, caffiene products, coffee  -Diet: variety of foods, balance and well rounded, larger portion sizes  -Exercise: no regular exercise  -Taking folic acid, vitamin D or calcium: no  -Diabetes and Dyslipidemia Screening:  -Hx of HTN: no  -Vaccines: shingles vaccine  - [place]  -pap history: last year, normal  -FDLMP:n/a  -sexual activity: yes, female partner, no new partners  -wants STI testing: no  -FH breast, colon or ovarian ca: see FH Last mammogram: this year and normal Last colon cancer screening: she thinks at age 21, seeing GI next month and will discuss  Breast Ca Risk Assessment: -[employer] 1.9% 5 year  -Alcohol, Tobacco, drug use: see social history  Review of Systems - no fevers, unintentional weight loss, vision loss, hearing loss, chest pain, sob, hemoptysis, melena, hematochezia, hematuria, genital discharge, changing or concerning skin lesions, bleeding, bruising, loc, thoughts of self harm or SI  Past Medical History  Diagnosis Date  . GERD (gastroesophageal reflux disease)   . Hypertension   . Ruptured lumbar disc   . Chronic back pain     Past Surgical History  Procedure Laterality Date  . Back surgery  2000, 2009  . Stomach surgery  2006    Family History  Problem Relation Age of Onset  . Cancer Mother     breast cancer  . Stroke Father   . Heart disease Maternal Grandmother     History   Social History  . Marital Status: Single    Spouse Name: N/A    Number of Children: N/A  . Years of Education: N/A   Social History Main Topics  . Smoking status: Never Smoker   . Smokeless tobacco: Never Used  . Alcohol Use: 4.2  oz/week    7 Glasses of wine per week     Comment: wine every  night   . Drug Use: None  . Sexual Activity: No   Other Topics Concern  . None   Social History Narrative    Current outpatient prescriptions: amLODipine (NORVASC) 5 MG tablet, Take 1 tablet (5 mg total) by mouth daily., Disp: 90 tablet, Rfl: 3;  Biotin 5000 MCG TABS, Take by mouth daily., Disp: , Rfl: ;  cholecalciferol (VITAMIN D) 1000 UNITS tablet, Take 1,000 Units by mouth daily., Disp: , Rfl: ;  losartan (COZAAR) 50 MG tablet, Take 1 tablet (50 mg total) by mouth daily., Disp: 90 tablet, Rfl: 3 naproxen (NAPROSYN) 500 MG tablet, Take 1 tablet (500 mg total) by mouth 2 (two) times daily with a meal., Disp: 30 tablet, Rfl: 1;  omeprazole (PRILOSEC) 40 MG capsule, Take 1 capsule (40 mg total) by mouth daily., Disp: 30 capsule, Rfl: 3  EXAM:  Filed Vitals:   10/19/14 1126  BP: 122/82  Pulse: 83  Temp: 98.3 F (36.8 C)    GENERAL: vitals reviewed and listed below, alert, oriented, appears well hydrated and in no acute distress  HEENT: head atraumatic, PERRLA, normal appearance of eyes, ears, nose and mouth. moist mucus membranes.  NECK: supple, no masses or lymphadenopathy  LUNGS: clear to auscultation bilaterally, no rales, rhonchi or wheeze  CV: HRRR, no peripheral edema or cyanosis, normal pedal pulses  BREAST:declined  ABDOMEN: bowel sounds normal, soft, non tender to palpation, no masses, no rebound or guarding  OA:[id number]  RECTAL: refused  SKIN: no rash or abnormal lesions  MS: normal gait, moves all extremities normally  NEURO: CN II-XII grossly intact, normal muscle strength and sensation to light touch on extremities  PSYCH: normal affect, pleasant and cooperative  ASSESSMENT AND PLAN:  Discussed the following assessment and plan:  There are no diagnoses linked to this encounter.  -Discussed and advised all [place] preventive services health task force level A and B recommendations for age,  sex and risks.  -Advised at least 150 minutes of exercise per week and a healthy diet low in saturated fats and sweets and consisting of fresh fruits and vegetables, lean meats such as fish and white chicken and whole grains.  -FASTING labs, studies and vaccines per orders this encounter  Orders Placed This Encounter  Procedures  . Lipid panel  . Basic metabolic panel  . Hemoglobin A1c    Patient advised to return to clinic immediately if symptoms worsen or persist or new concerns.  Patient Instructions  BEFORE YOU LEAVE: -follow up app in 6 months  Talk with your gastroenterologist about doing a colonoscopy     Food Choices for Gastroesophageal Reflux Disease When you have gastroesophageal reflux disease (GERD), the foods you eat and your eating habits are very important. Choosing the right foods can help ease your discomfort.  WHAT GUIDELINES DO I NEED TO FOLLOW?   Choose fruits, vegetables, whole grains, and low-fat dairy products.   Choose low-fat meat, fish, and poultry.  Limit fats such as oils, salad dressings, butter, nuts, and avocado.   Keep a food diary. This helps you identify foods that cause symptoms.   Avoid foods that cause symptoms. These may be different for everyone.   Eat small meals often instead of 3 large meals a day.   Eat your meals slowly, in a place where you are relaxed.   Limit fried foods.   Cook foods using methods other than frying.   Avoid drinking alcohol.   Avoid drinking large amounts of liquids with your meals.   Avoid bending over or lying down until 2-3 hours after eating.  WHAT FOODS ARE NOT RECOMMENDED?  These are some foods and drinks that may make your symptoms worse: Vegetables Tomatoes. Tomato juice. Tomato and spaghetti sauce. Chili peppers. Onion and garlic. Horseradish. Fruits Oranges, grapefruit, and lemon (fruit and juice). Meats High-fat meats, fish, and poultry. This includes hot dogs, ribs, ham,  sausage, salami, and bacon. Dairy Whole milk and chocolate milk. Sour cream. Cream. Butter. Ice cream. Cream cheese.  Drinks Coffee and tea. Bubbly (carbonated) drinks or energy drinks. Condiments Hot sauce. Barbecue sauce.  Sweets/Desserts Chocolate and cocoa. Donuts. Peppermint and spearmint. Fats and Oils High-fat foods. This includes [place] fries and potato chips. Other Vinegar. Strong spices. This includes black pepper, white pepper, red pepper, cayenne, curry powder, cloves, ginger, and chili powder. The items listed above may not be a complete list of foods and drinks to avoid. Contact your dietitian for more information. Document Released: 05/06/2012 Document Revised: 11/10/2013 Document Reviewed: 09/09/2013 [facility] Patient Information 2015 [place], [place]. This information is not intended to replace advice given to you by your health care provider. Make sure you discuss any questions you have with your health care provider.        No Follow-up on file.  Krista Benton R.

## 2014-10-19 NOTE — Progress Notes (Signed)
Pre visit review using our clinic review tool, if applicable. No additional management support is needed unless otherwise documented below in the visit note. 

## 2014-10-19 NOTE — Patient Instructions (Addendum)
BEFORE YOU LEAVE: -follow up app in 6 months  Talk with your gastroenterologist about doing a colonoscopy     Food Choices for Gastroesophageal Reflux Disease When you have gastroesophageal reflux disease (GERD), the foods you eat and your eating habits are very important. Choosing the right foods can help ease your discomfort.  WHAT GUIDELINES DO I NEED TO FOLLOW?   Choose fruits, vegetables, whole grains, and low-fat dairy products.   Choose low-fat meat, fish, and poultry.  Limit fats such as oils, salad dressings, butter, nuts, and avocado.   Keep a food diary. This helps you identify foods that cause symptoms.   Avoid foods that cause symptoms. These may be different for everyone.   Eat small meals often instead of 3 large meals a day.   Eat your meals slowly, in a place where you are relaxed.   Limit fried foods.   Cook foods using methods other than frying.   Avoid drinking alcohol.   Avoid drinking large amounts of liquids with your meals.   Avoid bending over or lying down until 2-3 hours after eating.  WHAT FOODS ARE NOT RECOMMENDED?  These are some foods and drinks that may make your symptoms worse: Vegetables Tomatoes. Tomato juice. Tomato and spaghetti sauce. Chili peppers. Onion and garlic. Horseradish. Fruits Oranges, grapefruit, and lemon (fruit and juice). Meats High-fat meats, fish, and poultry. This includes hot dogs, ribs, ham, sausage, salami, and bacon. Dairy Whole milk and chocolate milk. Sour cream. Cream. Butter. Ice cream. Cream cheese.  Drinks Coffee and tea. Bubbly (carbonated) drinks or energy drinks. Condiments Hot sauce. Barbecue sauce.  Sweets/Desserts Chocolate and cocoa. Donuts. Peppermint and spearmint. Fats and Oils High-fat foods. This includes [place] fries and potato chips. Other Vinegar. Strong spices. This includes black pepper, white pepper, red pepper, cayenne, curry powder, cloves, ginger, and chili  powder. The items listed above may not be a complete list of foods and drinks to avoid. Contact your dietitian for more information. Document Released: 05/06/2012 Document Revised: 11/10/2013 Document Reviewed: 09/09/2013 [facility] Patient Information 2015 [place], [place]. This information is not intended to replace advice given to you by your health care Aithan Farrelly. Make sure you discuss any questions you have with your health care Brevan Luberto.

## 2014-10-20 LAB — LIPID PANEL
CHOL/HDL RATIO: 3
Cholesterol: 178 mg/dL (ref 0–200)
HDL: 54.2 mg/dL (ref >39.00)
LDL CALC: 102 mg/dL — AB (ref 0–99)
NonHDL: 123.8
TRIGLYCERIDES: 111 mg/dL (ref 0.0–149.0)
VLDL: 22.2 mg/dL (ref 0.0–40.0)

## 2014-10-20 LAB — BASIC METABOLIC PANEL
BUN: 13 mg/dL (ref 6–23)
CALCIUM: 9.3 mg/dL (ref 8.4–10.5)
CO2: 27 mEq/L (ref 19–32)
Chloride: 103 mEq/L (ref 96–112)
Creatinine, Ser: 0.7 mg/dL (ref 0.4–1.2)
GFR: 112.5 mL/min (ref >60.00)
Glucose, Bld: 83 mg/dL (ref 70–99)
Potassium: 4.5 mEq/L (ref 3.5–5.1)
Sodium: 138 mEq/L (ref 135–145)

## 2014-10-21 ENCOUNTER — Telehealth: Payer: Self-pay | Admitting: Family Medicine

## 2014-10-21 NOTE — Telephone Encounter (Signed)
emmi emailed °

## 2014-11-07 ENCOUNTER — Other Ambulatory Visit: Payer: Self-pay | Admitting: Family Medicine

## 2014-11-08 ENCOUNTER — Ambulatory Visit (INDEPENDENT_AMBULATORY_CARE_PROVIDER_SITE_OTHER): Payer: 59 | Admitting: Gastroenterology

## 2014-11-08 ENCOUNTER — Encounter: Payer: Self-pay | Admitting: Gastroenterology

## 2014-11-08 VITALS — BP 122/68 | HR 84 | Ht 66.0 in | Wt 167.2 lb

## 2014-11-08 DIAGNOSIS — K219 Gastro-esophageal reflux disease without esophagitis: Secondary | ICD-10-CM

## 2014-11-08 DIAGNOSIS — Z1211 Encounter for screening for malignant neoplasm of colon: Secondary | ICD-10-CM

## 2014-11-08 MED ORDER — MOVIPREP 100 G PO SOLR
1.0000 | Freq: Once | ORAL | Status: DC
Start: 2014-11-08 — End: 2015-03-04

## 2014-11-08 NOTE — Progress Notes (Deleted)
HPI: This is a      Review of systems: Pertinent positive and negative review of systems were noted in the above HPI section. Complete review of systems was performed and was otherwise normal.    Past Medical History  Diagnosis Date  . GERD (gastroesophageal reflux disease)   . Hypertension   . Ruptured lumbar disc   . Chronic back pain   . Colon polyps     Past Surgical History  Procedure Laterality Date  . Back surgery  2000, 2009  . Stomach surgery  2006    Current Outpatient Prescriptions  Medication Sig Dispense Refill  . amLODipine (NORVASC) 5 MG tablet Take 1 tablet (5 mg total) by mouth daily. 90 tablet 3  . Biotin 5000 MCG TABS Take by mouth daily.    . cholecalciferol (VITAMIN D) 1000 UNITS tablet Take 1,000 Units by mouth daily.    Marland Kitchen losartan (COZAAR) 50 MG tablet Take 1 tablet (50 mg total) by mouth daily. 90 tablet 3  . naproxen (NAPROSYN) 500 MG tablet Take 1 tablet (500 mg total) by mouth 2 (two) times daily with a meal. 30 tablet 1  . omeprazole (PRILOSEC) 40 MG capsule Take 1 capsule (40 mg total) by mouth daily. 30 capsule 3  . omeprazole (PRILOSEC) 40 MG capsule TAKE ONE CAPSULE BY MOUTH DAILY 30 capsule 3   No current facility-administered medications for this visit.    Allergies as of 11/08/2014  . (No Known Allergies)    Family History  Problem Relation Age of Onset  . Breast cancer Mother   . Stroke Father   . Heart disease Maternal Grandmother   . Colon cancer Neg Hx   . Colon polyps Neg Hx   . Diabetes Neg Hx   . Kidney disease Neg Hx   . Esophageal cancer Neg Hx   . Gallbladder disease Neg Hx     History   Social History  . Marital Status: Single    Spouse Name: N/A    Number of Children: 3  . Years of Education: N/A   Occupational History  . [employer] History Main Topics  . Smoking status: Never Smoker   . Smokeless tobacco: Never Used  . Alcohol Use: 4.2 oz/week    7 Glasses of wine per week     Comment:  wine every  night   . Drug Use: No  . Sexual Activity: No   Other Topics Concern  . Not on file   Social History Narrative       Physical Exam: BP 122/68 mmHg  Pulse 84  Ht 5\' 6"  (1.676 m)  Wt 167 lb 4 oz (75.864 kg)  BMI 27.01 kg/m2 Constitutional: generally well-appearing Psychiatric: alert and oriented x3 Eyes: extraocular movements intact Mouth: oral pharynx moist, no lesions Neck: supple no lymphadenopathy Cardiovascular: heart regular rate and rhythm Lungs: clear to auscultation bilaterally Abdomen: soft, nontender, nondistended, no obvious ascites, no peritoneal signs, normal bowel sounds Extremities: no lower extremity edema bilaterally Skin: no lesions on visible extremities    Assessment and plan: 63 y.o. female with  ***  ***

## 2014-11-08 NOTE — Patient Instructions (Signed)
You will be set up for a colonoscopy colon cancer screening. You will be set up for an upper endoscopy (history of GIST), GERD. You should change the way you are taking your antiacid medicine (omeprazole) so that you are taking it 20-30 minutes prior to a decent meal as that is the way the pill is designed to work most effectively. Start taking ranitidine 150 mg pill, one pill at bedtime every night. We will get records sent from your previous surgeon for review.  This will include any endoscopic (colonoscopy or upper endoscopy) procedures and any associated pathology reports.  [place], 2006.  Also stomach surgery op and path report.

## 2014-11-08 NOTE — Progress Notes (Signed)
HPI: This is a   very pleasant 63 year old woman whom I am meeting for the first time today.  Just moved from [place] in 2013.  Works for [employer].  Stomach surgery, 2006 two small GIST tumors removed, since then bad GERD symptoms. Nexium helped but insurance stopped paying.  Currently omeprazole 40mg  once dialy.  Takes PPI in AM, then BF 2-3 hours later.  PCP advised her to see GI.  Had colonoscopy 10 years ago.  Drink 2 caffinated bevs.  1 glass wine at night.  No real issues with bowels.  After she eats, she feels a bit dizzy. Feels a bit flushed,  Does not occur every meal but probably every day. Has been a problem for many, many years.  At least 10 years.    Overall she has gained 10 pounds in 2 years.    No dysphagia.     GB removed 10 years  [place] in [place], 2006    Review of systems: Pertinent positive and negative review of systems were noted in the above HPI section. Complete review of systems was performed and was otherwise normal.    Past Medical History  Diagnosis Date  . GERD (gastroesophageal reflux disease)   . Hypertension   . Ruptured lumbar disc   . Chronic back pain   . Colon polyps     Past Surgical History  Procedure Laterality Date  . Back surgery  2000, 2009  . Stomach surgery  2006    Current Outpatient Prescriptions  Medication Sig Dispense Refill  . amLODipine (NORVASC) 5 MG tablet Take 1 tablet (5 mg total) by mouth daily. 90 tablet 3  . Biotin 5000 MCG TABS Take by mouth daily.    . cholecalciferol (VITAMIN D) 1000 UNITS tablet Take 1,000 Units by mouth daily.    Marland Kitchen losartan (COZAAR) 50 MG tablet Take 1 tablet (50 mg total) by mouth daily. 90 tablet 3  . naproxen (NAPROSYN) 500 MG tablet Take 1 tablet (500 mg total) by mouth 2 (two) times daily with a meal. 30 tablet 1  . omeprazole (PRILOSEC) 40 MG capsule Take 1 capsule (40 mg total) by mouth daily. 30 capsule 3  . omeprazole (PRILOSEC) 40 MG capsule TAKE ONE  CAPSULE BY MOUTH DAILY 30 capsule 3   No current facility-administered medications for this visit.    Allergies as of 11/08/2014  . (No Known Allergies)    Family History  Problem Relation Age of Onset  . Breast cancer Mother   . Stroke Father   . Heart disease Maternal Grandmother   . Colon cancer Neg Hx   . Colon polyps Neg Hx   . Diabetes Neg Hx   . Kidney disease Neg Hx   . Esophageal cancer Neg Hx   . Gallbladder disease Neg Hx     History   Social History  . Marital Status: Single    Spouse Name: N/A    Number of Children: 3  . Years of Education: N/A   Occupational History  . [employer] History Main Topics  . Smoking status: Never Smoker   . Smokeless tobacco: Never Used  . Alcohol Use: 4.2 oz/week    7 Glasses of wine per week     Comment: wine every  night   . Drug Use: No  . Sexual Activity: No   Other Topics Concern  . Not on file   Social History Narrative  Physical Exam: BP 122/68 mmHg  Pulse 84  Ht 5\' 6"  (1.676 m)  Wt 167 lb 4 oz (75.864 kg)  BMI 27.01 kg/m2 Constitutional: generally well-appearing Psychiatric: alert and oriented x3 Eyes: extraocular movements intact Mouth: oral pharynx moist, no lesions Neck: supple no lymphadenopathy Cardiovascular: heart regular rate and rhythm Lungs: clear to auscultation bilaterally Abdomen: soft, nontender, nondistended, no obvious ascites, no peritoneal signs, normal bowel sounds Extremities: no lower extremity edema bilaterally Skin: no lesions on visible extremities    Assessment and plan: 63 y.o. female with  GERD, history of gastric Gist tumors, routine risk for colon cancer  First she is due for screening colonoscopy. Her last one was about 10 years ago. She is not sure what type of polyps were removed but she was told to have a repeat in 10 years. She has a history of Gist tumors of the stomach. I want to get her operation report from [place]. Her GERD is  not under good control and I made some medicine modifications for her and at the same time as her colonoscopy would like to examine her upper stomach check for GERD complications.

## 2014-11-23 ENCOUNTER — Telehealth: Payer: Self-pay | Admitting: Family Medicine

## 2014-11-23 DIAGNOSIS — M545 Low back pain, unspecified: Secondary | ICD-10-CM

## 2014-11-23 MED ORDER — NAPROXEN 500 MG PO TABS: 500.0000 mg | ORAL_TABLET | Freq: Two times a day (BID) | ORAL | Status: DC

## 2014-11-23 NOTE — Telephone Encounter (Signed)
Rx done. 

## 2014-11-23 NOTE — Telephone Encounter (Signed)
Pt needs refill on naproxen 500 mg #60  W/refills sent to [facility] [address]

## 2014-11-23 NOTE — Telephone Encounter (Signed)
Ok to refil 

## 2014-12-03 ENCOUNTER — Telehealth: Payer: Self-pay | Admitting: Gastroenterology

## 2014-12-03 NOTE — Telephone Encounter (Signed)
Colonoscopy 04/2007; Dr. Karlyn Agee; done for "repeat colonoscopy." Examination was "normal; recommended to have repeat colonoscopy in 5 years or as clinically indicated" EGD 04/2007 Dr. Karlyn Agee; done for "history of a large GIST resected near the GE junction. She has had dysphagia and was previously dilated several years ago. She has had recurrent dysphagia"  Findings "No evidence of obstruction. Mild Schatzki's ring. Mild nodularity at the previous surgical site, status post biopsy."  Path shows no recurrent neoplasia

## 2014-12-13 ENCOUNTER — Encounter: Payer: Self-pay | Admitting: Gastroenterology

## 2015-01-11 ENCOUNTER — Encounter: Payer: 59 | Admitting: Gastroenterology

## 2015-02-17 ENCOUNTER — Other Ambulatory Visit: Payer: Self-pay

## 2015-02-17 DIAGNOSIS — Z1231 Encounter for screening mammogram for malignant neoplasm of breast: Secondary | ICD-10-CM

## 2015-03-01 ENCOUNTER — Telehealth: Payer: Self-pay | Admitting: Gastroenterology

## 2015-03-01 NOTE — Telephone Encounter (Signed)
Pt reinstructed and instructions faxed to [phone] per pt request

## 2015-03-04 ENCOUNTER — Encounter: Payer: Self-pay | Admitting: Gastroenterology

## 2015-03-04 ENCOUNTER — Ambulatory Visit: Payer: 59 | Admitting: Gastroenterology

## 2015-03-04 VITALS — BP 107/59 | HR 72 | Temp 97.3°F | Resp 22 | Ht 66.0 in | Wt 167.0 lb

## 2015-03-04 DIAGNOSIS — D125 Benign neoplasm of sigmoid colon: Secondary | ICD-10-CM | POA: Diagnosis not present

## 2015-03-04 DIAGNOSIS — K2970 Gastritis, unspecified, without bleeding: Secondary | ICD-10-CM

## 2015-03-04 DIAGNOSIS — K219 Gastro-esophageal reflux disease without esophagitis: Secondary | ICD-10-CM

## 2015-03-04 DIAGNOSIS — D126 Benign neoplasm of colon, unspecified: Secondary | ICD-10-CM

## 2015-03-04 DIAGNOSIS — D123 Benign neoplasm of transverse colon: Secondary | ICD-10-CM

## 2015-03-04 DIAGNOSIS — Z1211 Encounter for screening for malignant neoplasm of colon: Secondary | ICD-10-CM | POA: Diagnosis not present

## 2015-03-04 MED ORDER — SODIUM CHLORIDE 0.9 % IV SOLN: 500.0000 mL | INTRAVENOUS | Status: DC

## 2015-03-04 NOTE — Op Note (Signed)
[place]  [address] [place], [address]   COLONOSCOPY PROCEDURE REPORT  PATIENT: Krista Armstrong, Krista Armstrong  MR#: [id number] BIRTHDATE: [birth year] , 75  yrs. old GENDER: female ENDOSCOPIST: Milus Banister, MD REFERRED EN:MMHWKG Kim, [place]:  03/04/2015 PROCEDURE:   Colonoscopy with snare polypectomy and Colonoscopy, screening First Screening Colonoscopy - Avg.  risk and is 50 yrs.  old or older - No.  Prior Negative Screening - Now for repeat screening. 10 or more years since last screening  History of Adenoma - Now for follow-up colonoscopy & has been > or = to 3 yrs.  N/A ASA CLASS:   Class II INDICATIONS:Screening for colonic neoplasia and Colorectal Neoplasm Risk Assessment for this procedure is average risk. MEDICATIONS: Monitored anesthesia care and Propofol 400 mg IV  DESCRIPTION OF PROCEDURE:   After the risks benefits and alternatives of the procedure were thoroughly explained, informed consent was obtained.  The digital rectal exam revealed no abnormalities of the rectum.   The LB 1528  endoscope was introduced through the anus and advanced to the cecum, which was identified by both the appendix and ileocecal valve. No adverse events experienced.   The quality of the prep was excellent.  The instrument was then slowly withdrawn as the colon was fully examined.   COLON FINDINGS: Three sessile polyps ranging between 3-64mm in size were found in the sigmoid colon and transverse colon. Polypectomies were performed with a cold snare.  The resection was complete, the polyp tissue was completely retrieved and sent to histology.   The examination was otherwise normal.  Retroflexed views revealed no abnormalities. The time to cecum = 4.5 Withdrawal time = 10.4   The scope was withdrawn and the procedure completed. COMPLICATIONS: There were no immediate complications.  ENDOSCOPIC IMPRESSION: 1.   Three sessile polyps ranging between 3-64mm in size were  found, removed and sent to pathology 2.   The examination was otherwise normal  RECOMMENDATIONS: If the polyp(s) removed today are proven to be adenomatous (pre-cancerous) polyps, you will need a colonoscopy in 3-5 years. Otherwise you should continue to follow colorectal cancer screening guidelines for "routine risk" patients with a colonoscopy in 10 years.  You will receive a letter within 1-2 weeks with the results of your biopsy as well as final recommendations.  Please call my office if you have not received a letter after 3 weeks.  eSigned:  Milus Banister, MD 03/04/2015 10:14 AM

## 2015-03-04 NOTE — Progress Notes (Signed)
No problems noted in the recovery room. maw 

## 2015-03-04 NOTE — Progress Notes (Signed)
Called to room to assist during endoscopic procedure.  Patient ID and intended procedure confirmed with present staff. Received instructions for my participation in the procedure from the performing physician.  

## 2015-03-04 NOTE — Progress Notes (Signed)
To recovery, report to [place], Therapist, sports. VSS

## 2015-03-04 NOTE — Patient Instructions (Signed)
YOU HAD AN ENDOSCOPIC PROCEDURE TODAY AT THE [facility]:   Refer to the procedure report that was given to you for any specific questions about what was found during the examination.  If the procedure report does not answer your questions, please call your gastroenterologist to clarify.  If you requested that your care partner not be given the details of your procedure findings, then the procedure report has been included in a sealed envelope for you to review at your convenience later.  YOU SHOULD EXPECT: Some feelings of bloating in the abdomen. Passage of more gas than usual.  Walking can help get rid of the air that was put into your GI tract during the procedure and reduce the bloating. If you had a lower endoscopy (such as a colonoscopy or flexible sigmoidoscopy) you may notice spotting of blood in your stool or on the toilet paper. If you underwent a bowel prep for your procedure, you may not have a normal bowel movement for a few days.  Please Note:  You might notice some irritation and congestion in your nose or some drainage.  This is from the oxygen used during your procedure.  There is no need for concern and it should clear up in a day or so.  SYMPTOMS TO REPORT IMMEDIATELY:   Following lower endoscopy (colonoscopy or flexible sigmoidoscopy):  Excessive amounts of blood in the stool  Significant tenderness or worsening of abdominal pains  Swelling of the abdomen that is new, acute  Fever of 100F or higher   Following upper endoscopy (EGD)  Vomiting of blood or coffee ground material  New chest pain or pain under the shoulder blades  Painful or persistently difficult swallowing  New shortness of breath  Fever of 100F or higher  Black, tarry-looking stools  For urgent or emergent issues, a gastroenterologist can be reached at any hour by calling [phone].   DIET: Your first meal following the procedure should be a small meal and then it is ok to progress to  your normal diet. Heavy or fried foods are harder to digest and may make you feel nauseous or bloated.  Likewise, meals heavy in dairy and vegetables can increase bloating.  Drink plenty of fluids but you should avoid alcoholic beverages for 24 hours.  ACTIVITY:  You should plan to take it easy for the rest of today and you should NOT DRIVE or use heavy machinery until tomorrow (because of the sedation medicines used during the test).    FOLLOW UP: Our staff will call the number listed on your records the next business day following your procedure to check on you and address any questions or concerns that you may have regarding the information given to you following your procedure. If we do not reach you, we will leave a message.  However, if you are feeling well and you are not experiencing any problems, there is no need to return our call.  We will assume that you have returned to your regular daily activities without incident.  If any biopsies were taken you will be contacted by phone or by letter within the next 1-3 weeks.  Please call us at [phone] if you have not heard about the biopsies in 3 weeks.    SIGNATURES/CONFIDENTIALITY: You and/or your care partner have signed paperwork which will be entered into your electronic medical record.  These signatures attest to the fact that that the information above on your After Visit Summary has been reviewed   and is understood.  Full responsibility of the confidentiality of this discharge information lies with you and/or your care-partner.     Handouts were given to your care partner on polyps and gastritis. You might notice some irritation in your nose or drainage.  This may cause feelings of congestion.  This is from the oxygen, which can be drying.  This is no cause for concern; this should clear up in a few days.  You may resume your current medications today.  Per Dr, [name] start taking RANITIDINE 150 mg, one pill at bedtime every night.   This is over the counter. Await biopsy results. Please call if any questions or concerns.

## 2015-03-04 NOTE — Op Note (Signed)
[place]  [address]. [place], [address]   ENDOSCOPY PROCEDURE REPORT  PATIENT: Krista, Armstrong  MR#: [id number] BIRTHDATE: 03-Apr-1951 , 64  yrs. old GENDER: female ENDOSCOPIST: Milus Banister, MD PROCEDURE DATE:  03/04/2015 PROCEDURE:  EGD w/ biopsy ASA CLASS:     Class II INDICATIONS:  EGD 04/2007 Dr.  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  She has had dysphagia and was previously dilated several years ago.  She has had recurrent dysphagia"  Findings "No evidence of obstruction.  Mild Schatzki's ring.  Mild nodularity at the previous surgical site, status post biopsy."  Path shows no recurrent neoplasia. MEDICATIONS: Propofol 170 mg IV TOPICAL ANESTHETIC: none  DESCRIPTION OF PROCEDURE: After the risks benefits and alternatives of the procedure were thoroughly explained, informed consent was obtained.  The LB [id number] endoscope was introduced through the mouth and advanced to the second portion of the duodenum , Without limitations.  The instrument was slowly withdrawn as the mucosa was fully examined.  There was surgical anastomosis (wedge resection) in proximal stomach, just distal to the GE junction.  There was a small hiatal hernia.  These two factors led to anatomic deformity, tortuousity of the distal esophagus.  There was mild, non-specific distal gastritis.  This was biopsied and sent to pathology.  The examination was otherwise normal.  Retroflexed views revealed no abnormalities.     The scope was then withdrawn from the patient and the procedure completed.  COMPLICATIONS: There were no immediate complications.  ENDOSCOPIC IMPRESSION: There was surgical anastomosis (wedge resection) in proximal stomach, just distal to the GE junction.  There was a small hiatal hernia.  These two factors led to anatomic deformity, tortuousity of the distal esophagus.  There was mild, non-specific distal gastritis.   This was biopsied and sent to pathology.  The examination was otherwise normal  RECOMMENDATIONS: If biopsies show H.  pylori, you will be started on appropriate antibiotics.  Please start ranitidine 150mg  pill (OTC), one pill at bedtime every night.    eSigned:  Milus Banister, MD 03/04/2015 10:18 AM    CC: Colin Benton, MD

## 2015-03-07 ENCOUNTER — Telehealth: Payer: Self-pay | Admitting: *Deleted

## 2015-03-07 ENCOUNTER — Other Ambulatory Visit: Payer: Self-pay | Admitting: Family Medicine

## 2015-03-07 NOTE — Telephone Encounter (Signed)
  Follow up Call-  Call back number 03/04/2015  Post procedure Call Back phone  # [phone]  Permission to leave phone message Yes     No answer at # given.  Left message on VM.

## 2015-03-14 ENCOUNTER — Encounter: Payer: Self-pay | Admitting: Gastroenterology

## 2015-04-12 ENCOUNTER — Ambulatory Visit
Admission: RE | Admit: 2015-04-12 | Discharge: 2015-04-12 | Disposition: A | Discharge status: Home / Self Care | Payer: 59 | Source: Ambulatory Visit

## 2015-04-12 ENCOUNTER — Ambulatory Visit: Payer: 59

## 2015-04-12 DIAGNOSIS — Z1231 Encounter for screening mammogram for malignant neoplasm of breast: Secondary | ICD-10-CM

## 2015-04-13 ENCOUNTER — Other Ambulatory Visit: Payer: Self-pay | Admitting: Family Medicine

## 2015-04-13 DIAGNOSIS — R928 Other abnormal and inconclusive findings on diagnostic imaging of breast: Secondary | ICD-10-CM

## 2015-04-26 ENCOUNTER — Other Ambulatory Visit: Payer: Self-pay | Admitting: Family Medicine

## 2015-04-26 ENCOUNTER — Ambulatory Visit
Admission: RE | Admit: 2015-04-26 | Discharge: 2015-04-26 | Disposition: A | Discharge status: Home / Self Care | Payer: 59 | Source: Ambulatory Visit | Attending: Family Medicine | Admitting: Family Medicine

## 2015-04-26 DIAGNOSIS — N6320 Unspecified lump in the left breast, unspecified quadrant: Secondary | ICD-10-CM

## 2015-04-26 DIAGNOSIS — R928 Other abnormal and inconclusive findings on diagnostic imaging of breast: Secondary | ICD-10-CM

## 2015-05-03 ENCOUNTER — Ambulatory Visit
Admission: RE | Admit: 2015-05-03 | Discharge: 2015-05-03 | Disposition: A | Discharge status: Home / Self Care | Payer: 59 | Source: Ambulatory Visit | Attending: Family Medicine | Admitting: Family Medicine

## 2015-05-03 DIAGNOSIS — N6320 Unspecified lump in the left breast, unspecified quadrant: Secondary | ICD-10-CM

## 2015-05-03 DIAGNOSIS — C50919 Malignant neoplasm of unspecified site of unspecified female breast: Secondary | ICD-10-CM

## 2015-05-03 HISTORY — DX: Malignant neoplasm of unspecified site of unspecified female breast: C50.919

## 2015-05-03 HISTORY — PX: BREAST BIOPSY: SHX20

## 2015-05-06 ENCOUNTER — Telehealth: Payer: Self-pay | Admitting: *Deleted

## 2015-05-06 DIAGNOSIS — Z170 Estrogen receptor positive status [ER+]: Secondary | ICD-10-CM

## 2015-05-06 DIAGNOSIS — C50512 Malignant neoplasm of lower-outer quadrant of left female breast: Secondary | ICD-10-CM | POA: Insufficient documentation

## 2015-05-06 NOTE — Telephone Encounter (Signed)
Confirmed [facility] for 05/11/15 at 8am .  Instructions and contact information given.

## 2015-05-11 ENCOUNTER — Ambulatory Visit
Admission: RE | Admit: 2015-05-11 | Discharge: 2015-05-11 | Disposition: A | Discharge status: Home / Self Care | Payer: 59 | Source: Ambulatory Visit | Attending: Radiation Oncology | Admitting: Radiation Oncology

## 2015-05-11 ENCOUNTER — Encounter: Payer: Self-pay | Admitting: Skilled Nursing Facility1

## 2015-05-11 ENCOUNTER — Ambulatory Visit: Payer: 59

## 2015-05-11 ENCOUNTER — Encounter: Payer: Self-pay | Admitting: Physical Therapy

## 2015-05-11 ENCOUNTER — Encounter: Payer: Self-pay | Admitting: Oncology

## 2015-05-11 ENCOUNTER — Ambulatory Visit: Payer: 59 | Attending: General Surgery | Admitting: Physical Therapy

## 2015-05-11 ENCOUNTER — Other Ambulatory Visit (HOSPITAL_BASED_OUTPATIENT_CLINIC_OR_DEPARTMENT_OTHER): Payer: 59

## 2015-05-11 ENCOUNTER — Other Ambulatory Visit: Payer: Self-pay | Admitting: General Surgery

## 2015-05-11 ENCOUNTER — Ambulatory Visit (HOSPITAL_BASED_OUTPATIENT_CLINIC_OR_DEPARTMENT_OTHER): Payer: 59 | Admitting: Oncology

## 2015-05-11 ENCOUNTER — Encounter: Payer: Self-pay | Admitting: General Practice

## 2015-05-11 VITALS — BP 129/87 | HR 85 | Temp 98.2°F | Resp 18 | Ht 66.0 in | Wt 162.5 lb

## 2015-05-11 DIAGNOSIS — C50512 Malignant neoplasm of lower-outer quadrant of left female breast: Secondary | ICD-10-CM

## 2015-05-11 DIAGNOSIS — R293 Abnormal posture: Secondary | ICD-10-CM | POA: Diagnosis not present

## 2015-05-11 DIAGNOSIS — Z170 Estrogen receptor positive status [ER+]: Secondary | ICD-10-CM | POA: Diagnosis not present

## 2015-05-11 LAB — CBC WITH DIFFERENTIAL/PLATELET
BASO%: 2 % (ref 0.0–2.0)
BASOS ABS: 0.1 10*3/uL (ref 0.0–0.1)
EOS ABS: 0.1 10*3/uL (ref 0.0–0.5)
EOS%: 2.2 % (ref 0.0–7.0)
HCT: 36.8 % (ref 34.8–46.6)
HEMOGLOBIN: 12.1 g/dL (ref 11.6–15.9)
LYMPH%: 33.9 % (ref 14.0–49.7)
MCH: 28.1 pg (ref 25.1–34.0)
MCHC: 32.8 g/dL (ref 31.5–36.0)
MCV: 85.7 fL (ref 79.5–101.0)
MONO#: 0.3 10*3/uL (ref 0.1–0.9)
MONO%: 5.7 % (ref 0.0–14.0)
NEUT%: 56.2 % (ref 38.4–76.8)
NEUTROS ABS: 2.9 10*3/uL (ref 1.5–6.5)
PLATELETS: 264 10*3/uL (ref 145–400)
RBC: 4.3 10*6/uL (ref 3.70–5.45)
RDW: 13.9 % (ref 11.2–14.5)
WBC: 5.2 10*3/uL (ref 3.9–10.3)
lymph#: 1.8 10*3/uL (ref 0.9–3.3)

## 2015-05-11 LAB — COMPREHENSIVE METABOLIC PANEL (CC13)
ALBUMIN: 4 g/dL (ref 3.5–5.0)
ALK PHOS: 74 U/L (ref 40–150)
ALT: 13 U/L (ref 0–55)
AST: 16 U/L (ref 5–34)
Anion Gap: 8 mEq/L (ref 3–11)
BILIRUBIN TOTAL: 0.53 mg/dL (ref 0.20–1.20)
BUN: 11.7 mg/dL (ref 7.0–26.0)
CO2: 28 mEq/L (ref 22–29)
Calcium: 9.4 mg/dL (ref 8.4–10.4)
Chloride: 106 mEq/L (ref 98–109)
Creatinine: 0.8 mg/dL (ref 0.6–1.1)
EGFR: >90 mL/min/{1.73_m2} (ref >90)
Glucose: 110 mg/dl (ref 70–140)
POTASSIUM: 4.1 meq/L (ref 3.5–5.1)
SODIUM: 141 meq/L (ref 136–145)
TOTAL PROTEIN: 7.6 g/dL (ref 6.4–8.3)

## 2015-05-11 NOTE — Progress Notes (Signed)
Subjective:     Patient ID: Krista Armstrong, female   DOB: [birth year], 64 y.o.   MRN: [id number]  HPI   Review of Systems     Objective:   Physical Exam For the patient to understand and be given the tools to implement a healthy plant based diet during their cancer diagnosis.     Assessment:     Patient was seen today and found to be in good spirits and accompanied by her daughter. Pts wt 162 pounds 5'6'' and BMI 26.3. Pts current/relevant prescription omeprezole. Pts left breast is affected. Pt was open to the information and states she will make more of an effort to eat vegetables and drink water. Pt had some concerns about coffee. Pts labs WNL.     Plan:     Dietitian educated the patient on implementing a plant based diet by incorporating more plant proteins, fruits, and vegetables. As a part of a healthy routine physical activity was discussed. Dietitian assured the pt 8 ounces of coffee with 2 tsp of sugar is not a health concern. The importance of legitimate, evidence based information was discussed and examples were given. A folder of evidence based information with a focus on a plant based diet and general nutrition during cancer was given to the patient.  As a part of the continuum of care the cancer dietitian's contact information was given to the patient in the event they would like to have a follow up appointment.

## 2015-05-11 NOTE — Patient Instructions (Signed)

## 2015-05-11 NOTE — Progress Notes (Signed)
  Radiation Oncology         [phone]) [phone] ________________________________  Initial outpatient Consultation - Date: 05/11/2015   Name: Krista Armstrong MRN: [id number]   DOB: 07/09/1951  REFERRING PHYSICIAN: Fanny Skates, MD  DIAGNOSIS AND STAGE: T1N0 Left Breast Cancer.  HISTORY OF PRESENT ILLNESS::Krista Armstrong is a 64 y.o. female who present with a mammogram of a left breast mass of 7 mm showed on ultrasound. A biopsy showed a Grade 1 Invasive Ductal Carcinoma,  ER +, HER 2 negative,  Ki67 at 5%. She is a good candidate for lumpectomy and oncotype.  She has healed up well from biopsy, she is here with her daughter. She is interested in breast conservation. Her mother had breast cancer in 1997.  PREVIOUS RADIATION THERAPY: No  Past medical, social and family history were reviewed in the electronic chart. Review of symptoms was reviewed in the electronic chart. Medications were reviewed in the electronic chart.   Gynecology History: Age at first menstrual period 79. Are you still having periods?  No           Approximately date of last period  1998 If you are still having periods: Are your periods regular? No If you no longer have periods: Have you used hormone replacement? No   Obstetric History: How many children have you carried to term? 3          Your age at first birth 18 Are you pregnant now, or trying to get pregnant?    No Have you used birth control pills or hormone shots for contraception?   Yes If so, for how long (or approximately dates):  10 years until [place]: There were no vitals filed for this visit.. . Pleasant female in no distress. No palpable cervicla, supraclavicular or axillary adenopathy. No palpable abnormalities of the right breast. Biopsy change palpable in the left breast.   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 We  discussed the process of simulation and the placement of tattoos. We discussed possible side effects during treatment including but not limited to skin irritation darkness and fatigue. We discussed long-term effects of treatment which are extremely unlikely but possible including damage to the lungs and ribs.  We discussed the use of breath hold technique for cardiac sparing if necessary. We discussed the low likelihood of secondary malignancies. We discussed the possible late side effects including but not limited to permanent skin darkening, and breast swelling.    We discussed the need for oncotype testing and that radiation would begin after this was performed. I will see her back after her surgery.   She was seen by surgery, medical oncology, chaplain, physical therapy and our survivorship navigator.    This document serves as a record of services personally performed by Thea Silversmith , MD. It was created on her behalf by Jeralene Peters, a trained medical scribe. The creation of this record is based on the scribe's personal observations and the provider's statements to them. This document has been checked and approved by the attending provider.    I spent 40 minutes  face to face with the patient and more than 50% of that time was spent in counseling and/or coordination of care.   ------------------------------------------------  Thea Silversmith, MD

## 2015-05-11 NOTE — Therapy (Signed)
[place], [place], [address] Phone: [phone]   Fax:  [phone]  Physical Therapy Evaluation  Patient Details  Name: Krista Armstrong MRN: [id number] Date of Birth: 1951-07-27 Referring Provider:  Fanny Skates, MD  Encounter Date: 05/11/2015      PT End of Session - 05/11/15 1746    Visit Number 1   Number of Visits 1   PT Start Time 1050   PT Stop Time 1115   PT Time Calculation (min) 25 min   Activity Tolerance Patient tolerated treatment well   Behavior During Therapy [facility] for tasks assessed/performed      Past Medical History  Diagnosis Date  . GERD (gastroesophageal reflux disease)   . Hypertension   . Ruptured lumbar disc   . Chronic back pain   . Colon polyps   . Hot flashes     Past Surgical History  Procedure Laterality Date  . Back surgery  2000, 2009  . Stomach surgery  2006  . Back surgery      There were no vitals filed for this visit.  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  It has a Ki67 of 5% and is located in the left lower outer quadrant.   Patient Stated Goals Reduce lymphedema risk and learn post op shoulder ROM HEP   Currently in Pain? No/denies            [facility] PT Assessment - 05/11/15 0001    Assessment   Medical Diagnosis Left breast cancer   Onset Date/Surgical Date 05/04/15   Hand Dominance Right   Precautions   Precautions Other (comment)  active breast cancer   Restrictions   Weight Bearing  Restrictions No   Balance Screen   Has the patient fallen in the past 6 months No   Has the patient had a decrease in activity level because of a fear of falling?  No   Is the patient reluctant to leave their home because of a fear of falling?  No   Home Environment   Living Environment Private residence   Living Arrangements Children  Adult daughter and 44 month and 26 y.o. grandchildren   Available Help at Discharge Family   Prior Function   Level of Independence Independent   Vocation Full time employment   Vocation Requirements [employer]; on computer   Leisure She walks 1-2x/wk for 30 minutes   Cognition   Overall Cognitive Status Within Functional Limits for tasks assessed   Posture/Postural Control   Posture/Postural Control Postural limitations   Postural Limitations Rounded Shoulders;Forward head   ROM / Strength   AROM / PROM / Strength AROM;Strength   AROM   AROM Assessment Site Shoulder   Right/Left Shoulder Right;Left   Right Shoulder Extension 64 Degrees   Right Shoulder Flexion 155 Degrees   Right Shoulder ABduction 173 Degrees   Right Shoulder Internal Rotation 73 Degrees   Right Shoulder External Rotation 90 Degrees   Left Shoulder Extension 54 Degrees  Left Shoulder Flexion 152 Degrees   Left Shoulder ABduction 168 Degrees   Left Shoulder Internal Rotation 70 Degrees   Left Shoulder External Rotation 90 Degrees   Strength   Overall Strength Within functional limits for tasks performed           LYMPHEDEMA/ONCOLOGY QUESTIONNAIRE - 05/11/15 1742    Type   Cancer Type Left breast cancer   Lymphedema Assessments   Lymphedema Assessments Upper extremities   Right Upper Extremity Lymphedema   10 cm Proximal to Olecranon Process 28 cm   Olecranon Process 25.1 cm   10 cm Proximal to Ulnar Styloid Process 22.6 cm   Just Proximal to Ulnar Styloid Process 16 cm   Across Hand at [employer] 20 cm   At [place] of 2nd Digit 6.4 cm   Left Upper  Extremity Lymphedema   10 cm Proximal to Olecranon Process 28.1 cm   Olecranon Process 25.4 cm   10 cm Proximal to Ulnar Styloid Process 21.9 cm   Just Proximal to Ulnar Styloid Process 16.1 cm   Across Hand at [employer] 20.4 cm   At [place] of 2nd Digit 6.4 cm        Patient was instructed today in a home exercise program today for post op shoulder range of motion. These included active assist shoulder flexion in sitting, scapular retraction, wall walking with shoulder abduction, and hands behind head external rotation.  She was encouraged to do these twice a day, holding 3 seconds and repeating 5 times when permitted by her physician.         PT Education - 05/11/15 1745    Education provided Yes   Education Details Lymphedema risk reduction and post op shoulder ROM HEP   Person(s) Educated Patient;Child(ren)   Methods Explanation;Demonstration;Handout   Comprehension Verbalized understanding              Breast Clinic Goals - 05/11/15 1757    Patient will be able to verbalize understanding of pertinent lymphedema risk reduction practices relevant to her diagnosis specifically related to skin care.   Time 1   Period Days   Status Achieved   Patient will be able to return demonstrate and/or verbalize understanding of the post-op home exercise program related to regaining shoulder range of motion.   Time 1   Period Days   Status Achieved   Patient will be able to verbalize understanding of the importance of attending the postoperative After Breast Cancer Class for further lymphedema risk reduction education and therapeutic exercise.   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  It has a Ki67 of 5% and is located in the left lower outer quadrant.  She is planning to have a left  lumpectomy with a sentinel node biopsy followed by Oncotype testing, radiation and anti-estrogen therapy.  She will benefit from post op PT to regain shoulder ROM and strength and prevent lymphedema.   Pt will benefit from skilled therapeutic intervention in order to improve on the following deficits Decreased range of motion;Pain;Impaired UE functional use;Decreased strength;Decreased knowledge of precautions   Rehab Potential Good   Clinical Impairments Affecting Rehab Potential none   PT Frequency One time visit   PT Treatment/Interventions Patient/family education;Therapeutic  exercise   Consulted and Agree with Plan of Care Patient;Family member/caregiver   Family Member Consulted Daughter       Patient will follow up at outpatient cancer rehab if needed following surgery.  If the patient requires physical therapy at that time, a specific plan will be dictated and sent to the referring physician for approval. The patient was educated today on appropriate basic range of motion exercises to begin post operatively and the importance of attending the After Breast Cancer class following surgery.  Patient was educated today on lymphedema risk reduction practices as it pertains to recommendations that will benefit the patient immediately following surgery.  She verbalized good understanding.  No additional physical therapy is indicated at this time.      Problem List Patient Active Problem List   Diagnosis Date Noted  . Breast cancer of lower-outer quadrant of left female breast 05/06/2015    Annia Friendly, PT 05/11/2015, 6:01 PM  [place], [place], [address] Phone: [phone]   Fax:  [phone]

## 2015-05-11 NOTE — Progress Notes (Signed)
[name]  Telephone:(336) [phone] Fax:(336) [phone]     ID: Krista Armstrong DOB: November 06, 1951  MR#: [id number]  ZDG#:[id number]  Patient Care Team: Lucretia Kern, DO as PCP - General (Family Medicine) Fanny Skates, MD as Consulting Physician (General Surgery) Chauncey Cruel, MD as Consulting Physician (Oncology) Thea Silversmith, MD as Consulting Physician (Radiation Oncology) Rockwell Germany, RN as Registered Nurse Mauro Kaufmann, RN as Registered Nurse PCP: Lucretia Kern., DO OTHER MD: Owens Loffler MD  CHIEF COMPLAINT: estrogen receptor positive breast cancer  CURRENT TREATMENT: Oncotype results pending   BREAST CANCER HISTORY: Krista Armstrong (pronounced "ROZ-mah") had routine screening mammography at the [facility] Center 04/12/2015, showing a possible asymmetry in the left breast. Bilateral diagnostic mammography with tomosynthesis the left breast ultrasonography 04/26/2015, showed the breast density to be category B. In the left breast there was a persistent area of focal asymmetry in the lower outer quadrant. On physical exam there was a 1 cm firm nodular area at 3:30 o'clock 7 cm from the nipple. Ultrasound confirmed an irregular hypoechoic mass measuring 7 mm corresponding to the palpable abnormality. There were no abnormal appearing axillary lymph nodes.  Biopsy of the left breast mass in question 05/03/2015 showed ([id number]) and invasive ductal carcinoma, grade 1, estrogen receptor 95% positive, progesterone receptor 1% positive, both with strong staining intensity, with an MIB-1 of 5%, and no HER-2 amplification, the signals ratio being 1.43 and the number per cell 2.50.  The patient's subsequent history is as detailed below.  INTERVAL HISTORY: [name] was evaluated in the multidisciplinary breast cancer clinic 05/11/2015 accompanied by her daughter Krista Armstrong. The patient's case was also discussed at the multidisciplinary breast cancer conference that same morning. At that  time a preliminary plan was suggested for breast conserving surgery with sentinel lymph node sampling, with Oncotype to follow and then adjuvant radiation and antiestrogen's  REVIEW OF SYSTEMS: There were no specific symptoms leading to the original mammogram, which was routinely scheduled. The patient denies unusual headaches, visual changes, nausea, vomiting, stiff neck, dizziness, or gait imbalance. There has been no cough, phlegm production, or pleurisy, no chest pain or pressure, and no change in bowel or bladder habits. The patient denies fever, rash, bleeding, unexplained fatigue or unexplained weight loss.the patient admits to some hot flashes, not severe. A detailed review of systems was otherwise entirely negative.  PAST MEDICAL HISTORY: Past Medical History  Diagnosis Date  . GERD (gastroesophageal reflux disease)   . Hypertension   . Ruptured lumbar disc   . Chronic back pain   . Colon polyps   . Hot flashes     PAST SURGICAL HISTORY: Past Surgical History  Procedure Laterality Date  . Back surgery  2000, 2009  . Stomach surgery  2006  . Back surgery      FAMILY HISTORY Family History  Problem Relation Age of Onset  . Breast cancer Mother   . Stroke Father   . Heart disease Maternal Grandmother   . Colon cancer Neg Hx   . Colon polyps Neg Hx   . Diabetes Neg Hx   . Kidney disease Neg Hx   . Esophageal cancer Neg Hx   . Gallbladder disease Neg Hx   the patient's father died at the age of 61 from a ruptured aortic aneurysm. The patient's mother was diagnosed with breast cancer at the age of 54 and died within a year from that problem. The patient had no brothers, one sister. There is no other  history of breast or ovarian cancer in the family.  GYNECOLOGIC HISTORY:  No LMP recorded. Patient is postmenopausal. Menarche age 64, first live birth age 44. The patient is GX P3. She stopped having periods in the late 1990s. She did not take hormone replacement. She did take  oral contraceptives remotely for approximately 10 years with no complications.  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. She is single. At home she lives with her daughter Krista Armstrong, who is currently unemployed but generally works as an Designer, multimedia out of her home. The patient's 2 other children are [name], who lives in [facility] and works as a better urinary and; and Transport planner, who lives in [place] and works as an Web designer. The patient has 4 grandchildren. She attends [employer].     ADVANCED DIRECTIVES: not in place; Not in place. At the initial clinic visit 05/14/2015 the patient was given the appropriate forms to complete and notarize at her discretion.  HEALTH MAINTENANCE: History  Substance Use Topics  . Smoking status: Never Smoker   . Smokeless tobacco: Never Used  . Alcohol Use: 4.2 oz/week    7 Glasses of wine per week     Comment: wine every  night      Colonoscopy:03/16/2015  PAP:  Bone density:never  Lipid panel:  No Known Allergies  Current Outpatient Prescriptions  Medication Sig Dispense Refill  . amLODipine (NORVASC) 5 MG tablet Take 1 tablet (5 mg total) by mouth daily. 90 tablet 3  . losartan (COZAAR) 50 MG tablet Take 1 tablet (50 mg total) by mouth daily. 90 tablet 3  . omeprazole (PRILOSEC) 40 MG capsule Take 1 capsule (40 mg total) by mouth daily. 30 capsule 3   No current facility-administered medications for this visit.    OBJECTIVE: middle-aged African-American woman who appears stated age  24 Vitals:   05/11/15 0809  BP: 129/87  Pulse: 85  Temp: 98.2 F (36.8 C)  Resp: 18     Body mass index is 26.24 kg/(m^2).    ECOG FS:0 - Asymptomatic  Ocular: Sclerae unicteric, pupils equal, round and reactive to light Ear-nose-throat: Oropharynx clear and moist Lymphatic: No cervical or supraclavicular adenopathy Lungs no rales or  rhonchi, good excursion bilaterally Heart regular rate and rhythm, no murmur appreciated Abd soft, nontender, positive bowel sounds MSK no focal spinal tenderness, no joint edema Neuro: non-focal, well-oriented, appropriate affect Breasts: the right breast is unremarkable. The left breast is status post recent biopsy. There is mild ecchymosis associated with this. There are no other nipple or skin changes of concernThe left axilla is benign.   LAB RESULTS:  CMP     Component Value Date/Time   NA 141 05/11/2015 0756   NA 138 10/19/2014 1205   K 4.1 05/11/2015 0756   K 4.5 10/19/2014 1205   CL 103 10/19/2014 1205   CO2 28 05/11/2015 0756   CO2 27 10/19/2014 1205   GLUCOSE 110 05/11/2015 0756   GLUCOSE 83 10/19/2014 1205   BUN 11.7 05/11/2015 0756   BUN 13 10/19/2014 1205   CREATININE 0.8 05/11/2015 0756   CREATININE 0.7 10/19/2014 1205   CALCIUM 9.4 05/11/2015 0756   CALCIUM 9.3 10/19/2014 1205   PROT 7.6 05/11/2015 0756   ALBUMIN 4.0 05/11/2015 0756   AST 16 05/11/2015 0756   ALT 13 05/11/2015 0756   ALKPHOS 74 05/11/2015 0756   BILITOT 0.53 05/11/2015  0756    INo results found for: SPEP, UPEP  Lab Results  Component Value Date   WBC 5.2 05/11/2015   NEUTROABS 2.9 05/11/2015   HGB 12.1 05/11/2015   HCT 36.8 05/11/2015   MCV 85.7 05/11/2015   PLT 264 05/11/2015      Chemistry      Component Value Date/Time   NA 141 05/11/2015 0756   NA 138 10/19/2014 1205   K 4.1 05/11/2015 0756   K 4.5 10/19/2014 1205   CL 103 10/19/2014 1205   CO2 28 05/11/2015 0756   CO2 27 10/19/2014 1205   BUN 11.7 05/11/2015 0756   BUN 13 10/19/2014 1205   CREATININE 0.8 05/11/2015 0756   CREATININE 0.7 10/19/2014 1205      Component Value Date/Time   CALCIUM 9.4 05/11/2015 0756   CALCIUM 9.3 10/19/2014 1205   ALKPHOS 74 05/11/2015 0756   AST 16 05/11/2015 0756   ALT 13 05/11/2015 0756   BILITOT 0.53 05/11/2015 0756       No results found for: LABCA2  No components found  for: LABCA125  No results for input(s): INR in the last 168 hours.  Urinalysis No results found for: COLORURINE, APPEARANCEUR, LABSPEC, PHURINE, GLUCOSEU, HGBUR, BILIRUBINUR, KETONESUR, PROTEINUR, UROBILINOGEN, NITRITE, LEUKOCYTESUR  STUDIES: Mm Digital Diagnostic Unilat L  05/03/2015   CLINICAL DATA:  64 year old female status post ultrasound-guided left breast biopsy  EXAM: DIAGNOSTIC LEFT MAMMOGRAM POST ULTRASOUND BIOPSY  COMPARISON:  Previous exam(s).  FINDINGS: Mammographic images were obtained following ultrasound guided biopsy of a suspicious left breast mass at 330. Post biopsy mammogram demonstrates a ribbon shaped biopsy marker within the mass seen mammographically.  IMPRESSION: Satisfactory marker placement post ultrasound-guided left breast biopsy.  Final Assessment: Post Procedure Mammograms for Marker Placement   Electronically Signed   By: Pamelia Hoit M.D.   On: 05/03/2015 16:30   Mm Digital Screening Bilateral  04/13/2015   CLINICAL DATA:  Screening.  EXAM: DIGITAL SCREENING BILATERAL MAMMOGRAM WITH CAD  COMPARISON:  Previous exam(s).  ACR Breast Density Category b: There are scattered areas of fibroglandular density.  FINDINGS: In the left breast, a possible asymmetry warrants further evaluation. In the right breast, no findings suspicious for malignancy. Images were processed with CAD.  IMPRESSION: Further evaluation is suggested for possible asymmetry in the left breast.  RECOMMENDATION: Diagnostic mammogram and possibly ultrasound of the left breast. (Code:[id number])  The patient will be contacted regarding the findings, and additional imaging will be scheduled.  BI-RADS CATEGORY  0: Incomplete. Need additional imaging evaluation and/or prior mammograms for comparison.   Electronically Signed   By: Margarette Canada M.D.   On: 04/13/2015 10:43   US Breast Ltd Uni Left Inc Axilla  04/26/2015   CLINICAL DATA:  64 year old female, callback from screening mammogram for possible left breast  asymmetry  EXAM: DIGITAL DIAGNOSTIC LEFT MAMMOGRAM WITH 3D TOMOSYNTHESIS WITH CAD  ULTRASOUND LEFT BREAST  COMPARISON:  04/12/2015, 04/05/2014, 04/01/2013  ACR Breast Density Category b: There are scattered areas of fibroglandular density.  FINDINGS: CC, MLO, lateral medial, and MLO spot-compression views of the left breast with tomosynthesis were performed. An XCCL view was also performed. Additional views demonstrate a persistent focal asymmetry within the slightly lower, outer left breast, posterior depth. No additional suspicious abnormality is identified.  Mammographic images were processed with CAD.  On physical exam, an approximately 1 cm firm, nodular area is felt at 330, 7 cm from the nipple.  Targeted ultrasound is performed, showing an irregular  hypoechoic mass at 330, 7 cm from the nipple measuring 5 x 7 x 5 mm corresponding to the palpable abnormality. No abnormal appearing axillary lymph nodes were identified.  IMPRESSION: Highly suspicious left breast mass.  RECOMMENDATION: Ultrasound-guided left breast biopsy.  I have discussed the findings and recommendations with the patient. Results were also provided in writing at the conclusion of the visit. If applicable, a reminder letter will be sent to the patient regarding the next appointment.  BI-RADS CATEGORY  5: Highly suggestive of malignancy.   Electronically Signed   By: Pamelia Hoit M.D.   On: 04/26/2015 12:35   Mm Diag Breast Tomo Uni Left  04/26/2015   CLINICAL DATA:  64 year old female, callback from screening mammogram for possible left breast asymmetry  EXAM: DIGITAL DIAGNOSTIC LEFT MAMMOGRAM WITH 3D TOMOSYNTHESIS WITH CAD  ULTRASOUND LEFT BREAST  COMPARISON:  04/12/2015, 04/05/2014, 04/01/2013  ACR Breast Density Category b: There are scattered areas of fibroglandular density.  FINDINGS: CC, MLO, lateral medial, and MLO spot-compression views of the left breast with tomosynthesis were performed. An XCCL view was also performed. Additional  views demonstrate a persistent focal asymmetry within the slightly lower, outer left breast, posterior depth. No additional suspicious abnormality is identified.  Mammographic images were processed with CAD.  On physical exam, an approximately 1 cm firm, nodular area is felt at 330, 7 cm from the nipple.  Targeted ultrasound is performed, showing an irregular hypoechoic mass at 330, 7 cm from the nipple measuring 5 x 7 x 5 mm corresponding to the palpable abnormality. No abnormal appearing axillary lymph nodes were identified.  IMPRESSION: Highly suspicious left breast mass.  RECOMMENDATION: Ultrasound-guided left breast biopsy.  I have discussed the findings and recommendations with the patient. Results were also provided in writing at the conclusion of the visit. If applicable, a reminder letter will be sent to the patient regarding the next appointment.  BI-RADS CATEGORY  5: Highly suggestive of malignancy.   Electronically Signed   By: Pamelia Hoit M.D.   On: 04/26/2015 12:35   [place] Lt Breast Bx W Loc Dev 1st Lesion Img Bx Spec US Guide  05/04/2015   ADDENDUM REPORT: 05/04/2015 12:56  ADDENDUM: Pathology reveals Left breast Grade I Invasive Ductal Carcinoma. This was found to be concordant by Dr. Pamelia Hoit. Pathology results were discussed with the patient via telephone. The patient reported tenderness at the biopsy site. Post biopsy instructions were reviewed and questions were answered. The patient was encouraged to call [facility] with any additional questions and or concerns. The patient was referred to [facility] on May 11, 2015.  Pathology results reported by Terie Purser RN on May 04, 2015.   Electronically Signed   By: Pamelia Hoit M.D.   On: 05/04/2015 12:56   05/04/2015   CLINICAL DATA:  64 year old female for ultrasound-guided left breast biopsy  EXAM: ULTRASOUND GUIDED LEFT BREAST CORE NEEDLE BIOPSY  COMPARISON:  Previous exam(s).   PROCEDURE: I met with the patient and we discussed the procedure of ultrasound-guided biopsy, including benefits and alternatives. We discussed the high likelihood of a successful procedure. We discussed the risks of the procedure including infection, bleeding, tissue injury, clip migration, and inadequate sampling. Informed written consent was given. The usual time-out protocol was performed immediately prior to the procedure.  Using sterile technique and 2% Lidocaine as local anesthetic, under direct ultrasound visualization, a 12 gauge vacuum-assisted device was used to perform biopsy  of a suspicious left breast mass at 330, 7 cm from the nipple using a lateral to medial approach. At the conclusion of the procedure, a ribbon shaped tissue marker clip was deployed into the biopsy cavity. Follow-up 2-view mammogram was performed and dictated separately.  IMPRESSION: Ultrasound-guided biopsy of a suspicious left breast mass at 330. No apparent complications.  Electronically Signed: By: Pamelia Hoit M.D. On: 05/03/2015 16:29    ASSESSMENT: 64 y.o. [place] woman status post left breast biopsy 05/03/2015 for a clinical T1b N0, stage IA invasive ductal carcinoma, grade 1, estrogen receptor strongly positive, progesterone receptor minimally positive, with an MIB-1 of 5% and no HER-2 amplification.  (1) breast conserving surgery with sentinel lymph node sampling pending  (2) Oncotype DX will be requested from the final pathology sample if lymph nodes proved negative as expected  (3) radiation to follow as appropriate  (4) anti-estrogens to follow radiation  PLAN: We spent the better part of today's hour-long appointment discussing the biology of breast cancer in general, and the specifics of the patient's tumor in particular. The patient understands she has a small, nonaggressive-looking, slow-growing, stage I tumor. She is an excellent candidate for breast conserving surgery and radiation, and that is the  initial plan.  We discussed systemic therapy, and she understands she will be a very good candidate for anti-estrogens, which she will take for 5-10 years. She is not a candidate for anti-HER-2 immunotherapy.  The chemotherapy decision is more complicated. In general chemotherapy is not very effective in slow-growing nonaggressive-looking tumors like her ears. If she proves to be node negative as we expect, we will request an Oncotype to be sent from the definitive surgical specimen. My expectation is that the Oncotype will fall in the low risk category.  In that case she will proceed directly to radiation after surgery, and then return to see me after radiation to discuss antiestrogen therapy.  Of course if the Oncotype falls in the high-risk category she will need chemotherapy. It if it falls in the intermediate category we will have to discuss chemotherapy. For that reason I am making her at tentative appointment in approximately 1 month. By then we should have those results. However if the results are clearly in the low-risk category I will call her with that information and cancel that appointment. She would then proceed to radiation and see me after the completion of radiation  Devyn  has a good understanding of the overall plan. She agrees with it. She knows the goal of treatment in her case is cure. She will call with any problems that may develop before her next visit here.  Chauncey Cruel, MD   05/11/2015 11:27 AM Medical Oncology and Hematology [facility] [address] [place], [place] [address] Tel. [phone]    Fax. [phone]

## 2015-05-11 NOTE — Progress Notes (Signed)
Checked in new pt with no financial concerns prior to seeing the dr.  Informed her if chemo is part of her treatment we will get auth from her ins and she has my card for any billing questions, concerns or if financial assistance is needed.

## 2015-05-11 NOTE — Progress Notes (Signed)
Krista Armstrong is a very pleasant 64 y.o. female from [place], [place] with newly diagnosed grade 1 invasive ductal carcinoma of the left breast.  Biopsy results revealed the tumor's prognostic profile is ER positive, PR positive, and HER2/neu negative. Ki67 is 5%.  She presents today with her daughter to the [facility]) for treatment consideration and recommendations from the breast surgeon, radiation oncologist, and medical oncologist.     I briefly met with [name] and her daughter during her [facility] visit today. We discussed the purpose of the Survivorship Clinic, which will include monitoring for recurrence, coordinating completion of age and gender-appropriate cancer screenings, promotion of overall wellness, as well as managing potential late/long-term side effects of anti-cancer treatments.    The treatment plan for [name] will likely include surgery, radiation therapy, and anti-estrogen therapy.  As of today, the intent of treatment for [name] is cure, therefore she will be eligible for the Survivorship Clinic upon her completion of treatment.  Her survivorship care plan (SCP) document will be drafted and updated throughout the course of her treatment trajectory. She will receive the SCP in an office visit with myself in the Survivorship Clinic once she has completed treatment.   Krista Armstrong was encouraged to ask questions and all questions were answered to her satisfaction.  She was given my business card and encouraged to contact me with any concerns regarding survivorship.  I look forward to participating in her care.   Mike Craze, NP [place] [phone]

## 2015-05-12 NOTE — Progress Notes (Signed)
[place] Psychosocial Distress Screening Spiritual Care  Met with Ms Bobby Rumpf and daughter Ronnald Nian (spelling?) at [facility] to introduce [facility] team/resources, reviewing distress screen per protocol.  The patient scored a 5 on the Psychosocial Distress Thermometer which indicates moderate distress. Also assessed for distress and other psychosocial needs.    ONCBCN DISTRESS SCREENING 05/12/2015  Screening Type Initial Screening  Distress experienced in past week (1-10) 5  Family Problem type Other (comment)  Information Concerns Type Lack of info about diagnosis;Lack of info about treatment;Lack of info about complementary therapy choices;Lack of info about maintaining fitness  Physical Problem type Sleep/insomnia  Referral to support programs Yes  Other Spiritual Care   Ms Bobby Rumpf reports decreased distress after receiving more info in clinic.  She used this pastoral encounter to name that her mom died of stage IV breast cancer and that she is relieved to have caught her own cancer at a much earlier stage.  She also verbalized her discernment about the possibility of seeking a second opinion, but preferring to act now instead of delaying treatment in order to seek additional consultation elsewhere.  Per pt, "If it [the cancer] were bigger or spread out, I might think about a second opinion, but it's small, and I feel comfortable with everyone [care providers] here.  I just want to to start treating it right away so I can move on."    Follow up needed: No.  Pt and dtr are aware of ongoing availability of [facility] [place] team and programming, as well as [facility], and have packet of print material with contact information.  [place], [place]

## 2015-05-13 ENCOUNTER — Telehealth: Payer: Self-pay | Admitting: Oncology

## 2015-05-13 ENCOUNTER — Other Ambulatory Visit: Payer: Self-pay | Admitting: General Surgery

## 2015-05-13 DIAGNOSIS — C50512 Malignant neoplasm of lower-outer quadrant of left female breast: Secondary | ICD-10-CM

## 2015-05-13 NOTE — Telephone Encounter (Signed)
Left message to confirm appointment for July. Mailed calendar.

## 2015-05-20 ENCOUNTER — Telehealth: Payer: Self-pay | Admitting: *Deleted

## 2015-05-20 ENCOUNTER — Telehealth: Payer: Self-pay | Admitting: Oncology

## 2015-05-20 NOTE — Telephone Encounter (Signed)
Left pt vm to return call regarding [place] from 05/11/15. Contact information given.

## 2015-05-20 NOTE — Telephone Encounter (Signed)
Called and left a message with a new appointment per pof  anne °

## 2015-05-26 ENCOUNTER — Ambulatory Visit
Admission: RE | Admit: 2015-05-26 | Discharge: 2015-05-26 | Disposition: A | Discharge status: Home / Self Care | Payer: Commercial Managed Care - HMO | Source: Ambulatory Visit | Attending: General Surgery | Admitting: General Surgery

## 2015-05-26 ENCOUNTER — Encounter (HOSPITAL_BASED_OUTPATIENT_CLINIC_OR_DEPARTMENT_OTHER)
Admission: RE | Admit: 2015-05-26 | Discharge: 2015-05-26 | Disposition: A | Discharge status: Home / Self Care | Payer: Commercial Managed Care - HMO | Source: Ambulatory Visit | Attending: General Surgery | Admitting: General Surgery

## 2015-05-26 ENCOUNTER — Encounter (HOSPITAL_BASED_OUTPATIENT_CLINIC_OR_DEPARTMENT_OTHER): Payer: Self-pay | Admitting: *Deleted

## 2015-05-26 DIAGNOSIS — Z01818 Encounter for other preprocedural examination: Secondary | ICD-10-CM | POA: Insufficient documentation

## 2015-05-26 DIAGNOSIS — C50512 Malignant neoplasm of lower-outer quadrant of left female breast: Secondary | ICD-10-CM

## 2015-05-26 NOTE — Progress Notes (Signed)
Bring all medications. Ekg done here today.

## 2015-05-29 NOTE — H&P (Signed)
Krista Armstrong  Location: [facility] Patient #: [id number] DOB: 27-Feb-1951 Undefined / Language: Undefined / Race: Undefined Female        History of Present Illness  The patient is a 64 year old female who presents with breast cancer. A 64 year old Afro-American female, referred by Dr. Pamelia Hoit at the breast center of [facility] for evaluation and management of a newly diagnosed invasive cancer of the left breast, lower outer quadrant. She is being evaluated in the [facility] today by Dr. [name], Dr. Pablo Ledger and me. Dr. Colin Benton is her PCP. Dr. Oretha Caprice is her gastroenterologist. She has had a right breast biopsy for benign disease in the past, but otherwise no breast problems. Recent screening mammograms show a 7 mm mass in the left breast at 3:30 position, 7 cm from the nipple. Ultrasound of the left axilla is negative. Radiologist said this was palpable but the patient was unaware. 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 Her daughter is with her throughout the encounter today. Comorbidities include gastric surgery for 2 small just tumors in [place]. No recurrence. GERD. 2 lumbar back surgeries in the past. Hypertension. Family history is positive for breast cancer in her mother, who died of breast cancer. No other breast or ovarian cancer. Father had a stroke. She is single. Denies tobacco. Drinks wine. 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 She is strongly motivated for breast conservation surgery, and she is a good candidate for that. We talked about left partial mastectomy with radioactive seed localization and left axillary sentinel node biopsy. I discussed the indications, details, techniques, and numerous risk of the surgery with her and her daughter. She is aware the risk of bleeding, infection, cosmetic deformity, reoperation for positive margins, reoperation for positive nodes,  arm swelling, arm numbness, shoulder disability. She understands all these issues. At this time all of her questions are answered. She agrees with this plan.    Other Problems  Back Pain Breast Cancer Gastroesophageal Reflux Disease High blood pressure Lump In Breast Transfusion history  Past Surgical History  Breast Biopsy Bilateral. Colon Polyp Removal - Colonoscopy Gallbladder Surgery - Open Oral Surgery Resection of Stomach Spinal Surgery - Lower Back  Diagnostic Studies History  Colonoscopy within last year Mammogram within last year Pap Smear 1-5 years ago  Social History  Alcohol use Moderate alcohol use. Caffeine use Carbonated beverages, Coffee, Tea. No drug use Tobacco use Never smoker.  Family History  Breast Cancer Mother. Cerebrovascular Accident Family Members In General.  Pregnancy / Birth History  Age at menarche 56 years. Age of menopause 50-50 Contraceptive History Oral contraceptives. Gravida 3 Irregular periods Maternal age 68-25 Para 3  Review of Systems  General Present- Appetite Loss and Fatigue. Not Present- Chills, Fever, Night Sweats, Weight Gain and Weight Loss. Skin Not Present- Change in Wart/Mole, Dryness, Hives, Jaundice, New Lesions, Non-Healing Wounds, Rash and Ulcer. HEENT Present- Wears glasses/contact lenses. Not Present- Earache, Hearing Loss, Hoarseness, Nose Bleed, Oral Ulcers, Ringing in the Ears, Seasonal Allergies, Sinus Pain, Sore Throat, Visual Disturbances and Yellow Eyes. Respiratory Present- Chronic Cough and Snoring. Not Present- Bloody sputum, Difficulty Breathing and Wheezing. Breast Not Present- Breast Mass, Breast Pain, Nipple Discharge and Skin Changes. Cardiovascular Not Present- Chest Pain, Difficulty Breathing Lying Down, Leg Cramps, Palpitations, Rapid Heart Rate, Shortness of Breath and Swelling of Extremities. Gastrointestinal Present- Chronic diarrhea and Gets full quickly  at meals. Not Present- Abdominal Pain, Bloating,  Bloody Stool, Change in Bowel Habits, Constipation, Difficulty Swallowing, Excessive gas, Hemorrhoids, Indigestion, Nausea, Rectal Pain and Vomiting. Female Genitourinary Not Present- Frequency, Nocturia, Painful Urination, Pelvic Pain and Urgency. Musculoskeletal Not Present- Back Pain, Joint Pain, Joint Stiffness, Muscle Pain, Muscle Weakness and Swelling of Extremities. Neurological Not Present- Decreased Memory, Fainting, Headaches, Numbness, Seizures, Tingling, Tremor, Trouble walking and Weakness. Psychiatric Present- Anxiety and Fearful. Not Present- Bipolar, Change in Sleep Pattern, Depression and Frequent crying. Endocrine Not Present- Cold Intolerance, Excessive Hunger, Hair Changes, Heat Intolerance, Hot flashes and New Diabetes. Hematology Not Present- Easy Bruising, Excessive bleeding, Gland problems, HIV and Persistent Infections.   Physical Exam  General Mental Status-Alert. General Appearance-Consistent with stated age. Hydration-Well hydrated. Voice-Normal.  Head and Neck Head-normocephalic, atraumatic with no lesions or palpable masses. Trachea-midline. Thyroid Gland Characteristics - normal size and consistency.  Eye Eyeball - Bilateral-Extraocular movements intact. Sclera/Conjunctiva - Bilateral-No scleral icterus.  Chest and Lung Exam Chest and lung exam reveals -quiet, even and easy respiratory effort with no use of accessory muscles and on auscultation, normal breath sounds, no adventitious sounds and normal vocal resonance. Inspection Chest Wall - Normal. Back - normal.  Breast Breast - Left-Symmetric and Biopsy scar, Non Tender, No Dimpling, No Inflammation, No Lumpectomy scars, No Mastectomy scars, No Peau d' Orange. Note: There is a small bruise in the left breast, lower outer quadrant laterally. There is a small palpable mass less than 2 cm, suspect this is hematoma. No other mass in  either breast. No other skin change. No axillary adenopathy. Breast - Right-Symmetric, Non Tender, No Biopsy scars, no Dimpling, No Inflammation, No Lumpectomy scars, No Mastectomy scars, No Peau d' Orange.  Cardiovascular Cardiovascular examination reveals -normal heart sounds, regular rate and rhythm with no murmurs and normal pedal pulses bilaterally.  Abdomen Inspection Inspection of the abdomen reveals - No Hernias. Palpation/Percussion Palpation and Percussion of the abdomen reveal - Soft, Non Tender, No Rebound tenderness, No Rigidity (guarding) and No hepatosplenomegaly. Auscultation Auscultation of the abdomen reveals - Bowel sounds normal. Note: Upper midline scar. Healed. No hernia.   Neurologic Neurologic evaluation reveals -alert and oriented x 3 with no impairment of recent or remote memory. Mental Status-Normal.  Musculoskeletal Normal Exam - Left-Upper Extremity Strength Normal and Lower Extremity Strength Normal. Normal Exam - Right-Upper Extremity Strength Normal and Lower Extremity Strength Normal.  Lymphatic Head & Neck  General Head & Neck Lymphatics: Bilateral - Description - Normal. Axillary  General Axillary Region: Bilateral - Description - Normal. Tenderness - Non Tender. Femoral & Inguinal  Generalized Femoral & Inguinal Lymphatics: Bilateral - Description - Normal. Tenderness - Non Tender.    Assessment & Plan  PRIMARY CANCER OF LOWER OUTER QUADRANT OF LEFT FEMALE BREAST (174.5  C50.512)   Schedule for Surgery  Your recent imaging studies and biopsies show a 7 mm invasive ductal carcinoma of the left breast, lower outer quadrant. Estrogen receptor positive. HER-2 negative. We have discussed options for surgical management, including mastectomy, lumpectomy, sentinel node biopsy. We have briefly discussed radiation therapy and possible chemotherapy You have stated you would like breast conservation, and I think that you are a good  candidate for that. We have discussed the indications, details, and techniques and risks of the surgery in detail. Please read the written information given to you. Dr. Darrel Hoover office will call you tomorrow to schedule you for left partial mastectomy with radioactive seed localization and left axillary sentinel node biopsy. 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 Periodic endoscopy by Dr. [name].  HISTORY OF BACK SURGERY (V45.89  Z98.89)    Edsel Petrin. Dalbert Batman, M.D., [facility] General and Minimally invasive Surgery Breast and Colorectal Surgery Office:   [phone] Pager:   [phone]

## 2015-05-30 ENCOUNTER — Encounter (HOSPITAL_BASED_OUTPATIENT_CLINIC_OR_DEPARTMENT_OTHER): Payer: Self-pay | Admitting: Anesthesiology

## 2015-05-30 ENCOUNTER — Ambulatory Visit (HOSPITAL_BASED_OUTPATIENT_CLINIC_OR_DEPARTMENT_OTHER): Payer: Commercial Managed Care - HMO | Admitting: Anesthesiology

## 2015-05-30 ENCOUNTER — Encounter (HOSPITAL_BASED_OUTPATIENT_CLINIC_OR_DEPARTMENT_OTHER)
Admission: RE | Disposition: A | Discharge status: Home / Self Care | Payer: Self-pay | Source: Ambulatory Visit | Attending: General Surgery

## 2015-05-30 ENCOUNTER — Ambulatory Visit (HOSPITAL_BASED_OUTPATIENT_CLINIC_OR_DEPARTMENT_OTHER)
Admission: RE | Admit: 2015-05-30 | Discharge: 2015-05-30 | Disposition: A | Discharge status: Home / Self Care | Payer: Commercial Managed Care - HMO | Source: Ambulatory Visit | Attending: General Surgery | Admitting: General Surgery

## 2015-05-30 ENCOUNTER — Ambulatory Visit
Admission: RE | Admit: 2015-05-30 | Discharge: 2015-05-30 | Disposition: A | Discharge status: Home / Self Care | Payer: Commercial Managed Care - HMO | Source: Ambulatory Visit | Attending: General Surgery | Admitting: General Surgery

## 2015-05-30 ENCOUNTER — Ambulatory Visit (HOSPITAL_COMMUNITY)
Admission: RE | Admit: 2015-05-30 | Discharge: 2015-05-30 | Disposition: A | Discharge status: Home / Self Care | Payer: Commercial Managed Care - HMO | Source: Ambulatory Visit | Attending: General Surgery | Admitting: General Surgery

## 2015-05-30 DIAGNOSIS — Z170 Estrogen receptor positive status [ER+]: Secondary | ICD-10-CM | POA: Diagnosis not present

## 2015-05-30 DIAGNOSIS — C50512 Malignant neoplasm of lower-outer quadrant of left female breast: Secondary | ICD-10-CM

## 2015-05-30 DIAGNOSIS — I10 Essential (primary) hypertension: Secondary | ICD-10-CM | POA: Diagnosis not present

## 2015-05-30 DIAGNOSIS — K219 Gastro-esophageal reflux disease without esophagitis: Secondary | ICD-10-CM | POA: Insufficient documentation

## 2015-05-30 DIAGNOSIS — Z803 Family history of malignant neoplasm of breast: Secondary | ICD-10-CM | POA: Diagnosis not present

## 2015-05-30 HISTORY — PX: RADIOACTIVE SEED GUIDED PARTIAL MASTECTOMY WITH AXILLARY SENTINEL LYMPH NODE BIOPSY: SHX6520

## 2015-05-30 SURGERY — RADIOACTIVE SEED GUIDED PARTIAL MASTECTOMY WITH AXILLARY SENTINEL LYMPH NODE BIOPSY
Anesthesia: General | Site: Breast | Laterality: Left

## 2015-05-30 MED ORDER — KETOROLAC TROMETHAMINE 30 MG/ML IJ SOLN: 30.0000 mg | Freq: Once | INTRAMUSCULAR | Status: DC | PRN

## 2015-05-30 MED ORDER — CEFAZOLIN SODIUM-DEXTROSE 2-3 GM-% IV SOLR
INTRAVENOUS | Status: AC
  Filled 2015-05-30: qty 50

## 2015-05-30 MED ORDER — PROMETHAZINE HCL 25 MG/ML IJ SOLN: 6.2500 mg | INTRAMUSCULAR | Status: DC | PRN

## 2015-05-30 MED ORDER — FENTANYL CITRATE (PF) 100 MCG/2ML IJ SOLN
50.0000 ug | INTRAMUSCULAR | Status: DC | PRN
  Administered 2015-05-30 (×2): 100 ug via INTRAVENOUS

## 2015-05-30 MED ORDER — DEXAMETHASONE SODIUM PHOSPHATE 4 MG/ML IJ SOLN
INTRAMUSCULAR | Status: DC | PRN
  Administered 2015-05-30: 10 mg via INTRAVENOUS

## 2015-05-30 MED ORDER — PROPOFOL 10 MG/ML IV BOLUS
INTRAVENOUS | Status: DC | PRN
  Administered 2015-05-30: 200 mg via INTRAVENOUS

## 2015-05-30 MED ORDER — FENTANYL CITRATE (PF) 100 MCG/2ML IJ SOLN
INTRAMUSCULAR | Status: AC
  Filled 2015-05-30: qty 2

## 2015-05-30 MED ORDER — HYDROMORPHONE HCL 1 MG/ML IJ SOLN
0.2500 mg | INTRAMUSCULAR | Status: DC | PRN
  Administered 2015-05-30 (×3): 0.5 mg via INTRAVENOUS

## 2015-05-30 MED ORDER — HYDROCODONE-ACETAMINOPHEN 5-325 MG PO TABS
ORAL_TABLET | ORAL | Status: AC
  Filled 2015-05-30: qty 1

## 2015-05-30 MED ORDER — LIDOCAINE HCL (CARDIAC) 20 MG/ML IV SOLN
INTRAVENOUS | Status: DC | PRN
  Administered 2015-05-30: 50 mg via INTRAVENOUS

## 2015-05-30 MED ORDER — GLYCOPYRROLATE 0.2 MG/ML IJ SOLN: 0.2000 mg | Freq: Once | INTRAMUSCULAR | Status: DC | PRN

## 2015-05-30 MED ORDER — MIDAZOLAM HCL 5 MG/5ML IJ SOLN
INTRAMUSCULAR | Status: DC | PRN
  Administered 2015-05-30: 2 mg via INTRAVENOUS

## 2015-05-30 MED ORDER — CEFAZOLIN SODIUM-DEXTROSE 2-3 GM-% IV SOLR
INTRAVENOUS | Status: DC | PRN
  Administered 2015-05-30: 2 g via INTRAVENOUS

## 2015-05-30 MED ORDER — TECHNETIUM TC 99M SULFUR COLLOID FILTERED
1.0000 | Freq: Once | INTRAVENOUS | Status: AC | PRN
  Administered 2015-05-30: 1 via INTRADERMAL

## 2015-05-30 MED ORDER — ONDANSETRON HCL 4 MG/2ML IJ SOLN
INTRAMUSCULAR | Status: DC | PRN
  Administered 2015-05-30: 4 mg via INTRAVENOUS

## 2015-05-30 MED ORDER — BUPIVACAINE-EPINEPHRINE 0.5% -1:200000 IJ SOLN
INTRAMUSCULAR | Status: DC | PRN
  Administered 2015-05-30: 14 mL

## 2015-05-30 MED ORDER — SCOPOLAMINE 1 MG/3DAYS TD PT72: 1.0000 | MEDICATED_PATCH | Freq: Once | TRANSDERMAL | Status: DC | PRN

## 2015-05-30 MED ORDER — HYDROCODONE-ACETAMINOPHEN 5-325 MG PO TABS: 1.0000 | ORAL_TABLET | Freq: Four times a day (QID) | ORAL | Status: DC | PRN

## 2015-05-30 MED ORDER — MIDAZOLAM HCL 2 MG/2ML IJ SOLN
INTRAMUSCULAR | Status: AC
  Filled 2015-05-30: qty 2

## 2015-05-30 MED ORDER — LACTATED RINGERS IV SOLN
INTRAVENOUS | Status: DC
  Administered 2015-05-30 (×2): via INTRAVENOUS

## 2015-05-30 MED ORDER — EPHEDRINE SULFATE 50 MG/ML IJ SOLN
INTRAMUSCULAR | Status: DC | PRN
  Administered 2015-05-30: 10 mg via INTRAVENOUS

## 2015-05-30 MED ORDER — 0.9 % SODIUM CHLORIDE (POUR BTL) OPTIME
TOPICAL | Status: DC | PRN
  Administered 2015-05-30: 500 mL

## 2015-05-30 MED ORDER — HYDROCODONE-ACETAMINOPHEN 5-325 MG PO TABS
1.0000 | ORAL_TABLET | Freq: Once | ORAL | Status: AC
  Administered 2015-05-30: 1 via ORAL

## 2015-05-30 MED ORDER — FENTANYL CITRATE (PF) 100 MCG/2ML IJ SOLN
INTRAMUSCULAR | Status: AC
  Filled 2015-05-30: qty 6

## 2015-05-30 MED ORDER — ROPIVACAINE HCL 5 MG/ML IJ SOLN
INTRAMUSCULAR | Status: DC | PRN
  Administered 2015-05-30: 30 mL via PERINEURAL

## 2015-05-30 MED ORDER — HYDROMORPHONE HCL 1 MG/ML IJ SOLN
INTRAMUSCULAR | Status: AC
  Filled 2015-05-30: qty 1

## 2015-05-30 MED ORDER — BUPIVACAINE-EPINEPHRINE (PF) 0.5% -1:200000 IJ SOLN
INTRAMUSCULAR | Status: AC
  Filled 2015-05-30: qty 30

## 2015-05-30 MED ORDER — MIDAZOLAM HCL 2 MG/2ML IJ SOLN
1.0000 mg | INTRAMUSCULAR | Status: DC | PRN
  Administered 2015-05-30: 2 mg via INTRAVENOUS

## 2015-05-30 MED ORDER — METHYLENE BLUE 1 % INJ SOLN
INTRAMUSCULAR | Status: AC
Start: 2015-05-30 — End: 2015-05-30
  Filled 2015-05-30: qty 10

## 2015-05-30 MED ORDER — FENTANYL CITRATE (PF) 100 MCG/2ML IJ SOLN
INTRAMUSCULAR | Status: DC | PRN
  Administered 2015-05-30: 100 ug via INTRAVENOUS

## 2015-05-30 MED ORDER — SODIUM CHLORIDE 0.9 % IJ SOLN
INTRAMUSCULAR | Status: DC | PRN
  Administered 2015-05-30: 5 mL via INTRAMUSCULAR

## 2015-05-30 MED ORDER — SODIUM CHLORIDE 0.9 % IJ SOLN
INTRAMUSCULAR | Status: AC
Start: 2015-05-30 — End: 2015-05-30
  Filled 2015-05-30: qty 10

## 2015-05-30 SURGICAL SUPPLY — 62 items
APPLIER CLIP 9.375 MED OPEN (MISCELLANEOUS) ×3
BENZOIN TINCTURE PRP APPL 2/3 (GAUZE/BANDAGES/DRESSINGS) IMPLANT
BINDER BREAST LRG (GAUZE/BANDAGES/DRESSINGS) ×3 IMPLANT
BINDER BREAST MEDIUM (GAUZE/BANDAGES/DRESSINGS) IMPLANT
BINDER BREAST XLRG (GAUZE/BANDAGES/DRESSINGS) IMPLANT
BINDER BREAST XXLRG (GAUZE/BANDAGES/DRESSINGS) IMPLANT
BLADE HEX COATED 2.75 (ELECTRODE) ×3 IMPLANT
BLADE SURG 10 STRL SS (BLADE) IMPLANT
BLADE SURG 15 STRL LF DISP TIS (BLADE) ×1 IMPLANT
BLADE SURG 15 STRL SS (BLADE) ×2
CANISTER SUC SOCK COL 7IN (MISCELLANEOUS) IMPLANT
CANISTER SUCT 1200ML W/VALVE (MISCELLANEOUS) ×3 IMPLANT
CHLORAPREP W/TINT 26ML (MISCELLANEOUS) ×3 IMPLANT
CLIP APPLIE 9.375 MED OPEN (MISCELLANEOUS) ×1 IMPLANT
CLOSURE WOUND 1/2 X4 (GAUZE/BANDAGES/DRESSINGS)
COVER BACK TABLE 60X90IN (DRAPES) ×3 IMPLANT
COVER MAYO STAND STRL (DRAPES) ×3 IMPLANT
COVER PROBE W GEL 5X96 (DRAPES) ×3 IMPLANT
DECANTER SPIKE VIAL GLASS SM (MISCELLANEOUS) IMPLANT
DERMABOND ADVANCED (GAUZE/BANDAGES/DRESSINGS) ×2
DERMABOND ADVANCED .7 DNX12 (GAUZE/BANDAGES/DRESSINGS) ×1 IMPLANT
DEVICE DUBIN W/COMP PLATE 8390 (MISCELLANEOUS) ×3 IMPLANT
DRAPE LAPAROSCOPIC ABDOMINAL (DRAPES) ×3 IMPLANT
DRAPE UTILITY XL STRL (DRAPES) ×3 IMPLANT
DRSG PAD ABDOMINAL 8X10 ST (GAUZE/BANDAGES/DRESSINGS) ×3 IMPLANT
ELECT REM PT RETURN 9FT ADLT (ELECTROSURGICAL) ×3
ELECTRODE REM PT RTRN 9FT ADLT (ELECTROSURGICAL) ×1 IMPLANT
GAUZE SPONGE 4X4 12PLY STRL (GAUZE/BANDAGES/DRESSINGS) IMPLANT
GLOVE BIOGEL PI IND STRL 7.0 (GLOVE) ×1 IMPLANT
GLOVE BIOGEL PI INDICATOR 7.0 (GLOVE) ×2
GLOVE ECLIPSE 6.5 STRL STRAW (GLOVE) ×3 IMPLANT
GLOVE EUDERMIC 7 POWDERFREE (GLOVE) ×6 IMPLANT
GLOVE EXAM NITRILE MD LF STRL (GLOVE) ×3 IMPLANT
GOWN STRL REUS W/ TWL LRG LVL3 (GOWN DISPOSABLE) ×1 IMPLANT
GOWN STRL REUS W/ TWL XL LVL3 (GOWN DISPOSABLE) ×1 IMPLANT
GOWN STRL REUS W/TWL LRG LVL3 (GOWN DISPOSABLE) ×2
GOWN STRL REUS W/TWL XL LVL3 (GOWN DISPOSABLE) ×2
KIT MARKER MARGIN INK (KITS) ×3 IMPLANT
NDL SAFETY ECLIPSE 18X1.5 (NEEDLE) ×1 IMPLANT
NEEDLE HYPO 18GX1.5 SHARP (NEEDLE) ×2
NEEDLE HYPO 25X1 1.5 SAFETY (NEEDLE) ×6 IMPLANT
NS IRRIG 1000ML POUR BTL (IV SOLUTION) ×3 IMPLANT
PACK BASIN DAY SURGERY FS (CUSTOM PROCEDURE TRAY) ×3 IMPLANT
PENCIL BUTTON HOLSTER BLD 10FT (ELECTRODE) ×3 IMPLANT
SHEET MEDIUM DRAPE 40X70 STRL (DRAPES) ×3 IMPLANT
SLEEVE SCD COMPRESS KNEE MED (MISCELLANEOUS) ×3 IMPLANT
SPONGE LAP 18X18 X RAY DECT (DISPOSABLE) IMPLANT
SPONGE LAP 4X18 X RAY DECT (DISPOSABLE) ×3 IMPLANT
STRIP CLOSURE SKIN 1/2X4 (GAUZE/BANDAGES/DRESSINGS) IMPLANT
SUT MNCRL AB 4-0 PS2 18 (SUTURE) ×6 IMPLANT
SUT SILK 2 0 SH (SUTURE) ×3 IMPLANT
SUT VIC AB 2-0 CT1 27 (SUTURE)
SUT VIC AB 2-0 CT1 TAPERPNT 27 (SUTURE) IMPLANT
SUT VIC AB 3-0 SH 27 (SUTURE)
SUT VIC AB 3-0 SH 27X BRD (SUTURE) IMPLANT
SUT VICRYL 3-0 CR8 SH (SUTURE) ×3 IMPLANT
SYRINGE 10CC LL (SYRINGE) ×6 IMPLANT
TOWEL OR 17X24 6PK STRL BLUE (TOWEL DISPOSABLE) IMPLANT
TOWEL OR NON WOVEN STRL DISP B (DISPOSABLE) ×3 IMPLANT
TUBE CONNECTING 20'X1/4 (TUBING) ×1
TUBE CONNECTING 20X1/4 (TUBING) ×2 IMPLANT
YANKAUER SUCT BULB TIP NO VENT (SUCTIONS) ×3 IMPLANT

## 2015-05-30 NOTE — Interval H&P Note (Signed)
History and Physical Interval Note:  05/30/2015 10:45 AM  Krista Armstrong  has presented today for surgery, with the diagnosis of invasive cancer lower outer quadrant left breast  The various methods of treatment have been discussed with the patient and family. After consideration of risks, benefits and other options for treatment, the patient has consented to  Procedure(s): LEFT PARTIAL MASTECTOMY WITH RADIOACTIVE SEED LOCALIZATION, LEFT AXILLARY SENTINEL LYMPH NODE BIOPSY (Left) as a surgical intervention .  The patient's history has been reviewed, patient examined, no change in status, stable for surgery.  I have reviewed the patient's chart and labs.  Questions were answered to the patient's satisfaction.     [name]

## 2015-05-30 NOTE — Transfer of Care (Signed)
Immediate Anesthesia Transfer of Care Note  Patient: Krista Armstrong  Procedure(s) Performed: Procedure(s): LEFT PARTIAL MASTECTOMY WITH RADIOACTIVE SEED LOCALIZATION, LEFT AXILLARY SENTINEL LYMPH NODE BIOPSY (Left)  Patient Location: PACU  Anesthesia Type:GA combined with regional for post-op pain  Level of Consciousness: awake and patient cooperative  Airway & Oxygen Therapy: Patient Spontanous Breathing and Patient connected to face mask oxygen  Post-op Assessment: Report given to RN and Post -op Vital signs reviewed and stable  Post vital signs: Reviewed and stable  Last Vitals:  Filed Vitals:   05/30/15 1223  BP:   Pulse: 99  Temp:   Resp:     Complications: No apparent anesthesia complications

## 2015-05-30 NOTE — Anesthesia Preprocedure Evaluation (Signed)
Anesthesia Evaluation  Patient identified by MRN, date of birth, ID band Patient awake    Reviewed: Allergy & Precautions, NPO status , Patient's Chart, lab work & pertinent test results  Airway Mallampati: II  TM Distance: >3 FB Neck ROM: Full    Dental no notable dental hx.    Pulmonary neg pulmonary ROS,  breath sounds clear to auscultation  Pulmonary exam normal       Cardiovascular hypertension, Normal cardiovascular examRhythm:Regular Rate:Normal     Neuro/Psych negative neurological ROS  negative psych ROS   GI/Hepatic Neg liver ROS, GERD-  Medicated,  Endo/Other  negative endocrine ROS  Renal/GU negative Renal ROS  negative genitourinary   Musculoskeletal negative musculoskeletal ROS (+)   Abdominal   Peds negative pediatric ROS (+)  Hematology negative hematology ROS (+)   Anesthesia Other Findings   Reproductive/Obstetrics negative OB ROS                             Anesthesia Physical Anesthesia Plan  ASA: II  Anesthesia Plan: General   Post-op Pain Management:    Induction: Intravenous  Airway Management Planned: LMA  Additional Equipment:   Intra-op Plan:   Post-operative Plan: Extubation in OR  Informed Consent: I have reviewed the patients History and Physical, chart, labs and discussed the procedure including the risks, benefits and alternatives for the proposed anesthesia with the patient or authorized representative who has indicated his/her understanding and acceptance.   Dental advisory given  Plan Discussed with: CRNA and Surgeon  Anesthesia Plan Comments:         Anesthesia Quick Evaluation

## 2015-05-30 NOTE — Progress Notes (Signed)
Assisted Dr. [name] with left, ultrasound guided, pectoralis block. Side rails up, monitors on throughout procedure. See vital signs in flow sheet. Tolerated Procedure well. 

## 2015-05-30 NOTE — Anesthesia Procedure Notes (Addendum)
Anesthesia Regional Block:  Pectoralis block  Pre-Anesthetic Checklist: ,, timeout performed, Correct Patient, Correct Site, Correct Laterality, Correct Procedure, Correct Position, site marked, Risks and benefits discussed,  Surgical consent,  Pre-op evaluation,  At surgeon's request and post-op pain management  Laterality: Left  Prep: chloraprep       Needles:  Injection technique: Single-shot  Needle Type: Echogenic Stimulator Needle     Needle Length: 9cm 9 cm Needle Gauge: 21 and 21 G    Additional Needles:  Procedures: ultrasound guided (picture in chart) Pectoralis block Narrative:  Injection made incrementally with aspirations every 5 mL.  Performed by: Personally  Anesthesiologist: [name], GEORGE  Additional Notes: Patient tolerated the procedure well without complications   Procedure Name: LMA Insertion Date/Time: 05/30/2015 11:09 AM Performed by: Toula Moos L Pre-anesthesia Checklist: Patient identified, Emergency Drugs available, Suction available, Patient being monitored and Timeout performed Patient Re-evaluated:Patient Re-evaluated prior to inductionOxygen Delivery Method: Circle System Utilized Preoxygenation: Pre-oxygenation with 100% oxygen Intubation Type: IV induction Ventilation: Mask ventilation without difficulty LMA: LMA inserted LMA Size: 4.0 Number of attempts: 1 Airway Equipment and Method: Bite block Placement Confirmation: positive ETCO2 Tube secured with: Tape Dental Injury: Teeth and Oropharynx as per pre-operative assessment

## 2015-05-30 NOTE — Discharge Instructions (Signed)
[facility] °Office Phone Number [phone] ° °BREAST BIOPSY/ PARTIAL MASTECTOMY: POST OP INSTRUCTIONS ° °Always review your discharge instruction sheet given to you by the facility where your surgery was performed. ° °IF YOU HAVE DISABILITY OR FAMILY LEAVE FORMS, YOU MUST BRING THEM TO THE OFFICE FOR PROCESSING.  DO NOT GIVE THEM TO YOUR DOCTOR. ° °1. A prescription for pain medication may be given to you upon discharge.  Take your pain medication as prescribed, if needed.  If narcotic pain medicine is not needed, then you may take acetaminophen (Tylenol) or ibuprofen (Advil) as needed. °2. Take your usually prescribed medications unless otherwise directed °3. If you need a refill on your pain medication, please contact your pharmacy.  They will contact our office to request authorization.  Prescriptions will not be filled after 5pm or on week-ends. °4. You should eat very light the first 24 hours after surgery, such as soup, crackers, pudding, etc.  Resume your normal diet the day after surgery. °5. Most patients will experience some swelling and bruising in the breast.  Ice packs and a good support bra will help.  Swelling and bruising can take several days to resolve.  °6. It is common to experience some constipation if taking pain medication after surgery.  Increasing fluid intake and taking a stool softener will usually help or prevent this problem from occurring.  A mild laxative (Milk of Magnesia or Miralax) should be taken according to package directions if there are no bowel movements after 48 hours. °7. Unless discharge instructions indicate otherwise, you may remove your bandages 24-48 hours after surgery, and you may shower at that time.  You may have steri-strips (small skin tapes) in place directly over the incision.  These strips should be left on the skin for 7-10 days.  If your surgeon used skin glue on the incision, you may shower in 24 hours.  The glue will flake off over the  next 2-3 weeks.  Any sutures or staples will be removed at the office during your follow-up visit. °8. ACTIVITIES:  You may resume regular daily activities (gradually increasing) beginning the next day.  Wearing a good support bra or sports bra minimizes pain and swelling.  You may have sexual intercourse when it is comfortable. °a. You may drive when you no longer are taking prescription pain medication, you can comfortably wear a seatbelt, and you can safely maneuver your car and apply brakes. °b. RETURN TO WORK:  ______________________________________________________________________________________ °9. You should see your doctor in the office for a follow-up appointment approximately two weeks after your surgery.  Your doctor’s nurse will typically make your follow-up appointment when she calls you with your pathology report.  Expect your pathology report 2-3 business days after your surgery.  You may call to check if you do not hear from us after three days. °10. OTHER INSTRUCTIONS: _______________________________________________________________________________________________ _____________________________________________________________________________________________________________________________________ °_____________________________________________________________________________________________________________________________________ °_____________________________________________________________________________________________________________________________________ ° °WHEN TO CALL YOUR DOCTOR: °1. Fever over 101.0 °2. Nausea and/or vomiting. °3. Extreme swelling or bruising. °4. Continued bleeding from incision. °5. Increased pain, redness, or drainage from the incision. ° °The clinic staff is available to answer your questions during regular business hours.  Please don’t hesitate to call and ask to speak to one of the nurses for clinical concerns.  If you have a medical emergency, go to the nearest  emergency room or call 911.  A surgeon from [facility] is always on call at the hospital. ° °For further questions, please visit [email or web address]  ° ° °  Post Anesthesia Home Care Instructions ° °Activity: °Get plenty of rest for the remainder of the day. A responsible adult should stay with you for 24 hours following the procedure.  °For the next 24 hours, DO NOT: °-Drive a car °-Operate machinery °-Drink alcoholic beverages °-Take any medication unless instructed by your physician °-Make any legal decisions or sign important papers. ° °Meals: °Start with liquid foods such as gelatin or soup. Progress to regular foods as tolerated. Avoid greasy, spicy, heavy foods. If nausea and/or vomiting occur, drink only clear liquids until the nausea and/or vomiting subsides. Call your physician if vomiting continues. ° °Special Instructions/Symptoms: °Your throat may feel dry or sore from the anesthesia or the breathing tube placed in your throat during surgery. If this causes discomfort, gargle with warm salt water. The discomfort should disappear within 24 hours. ° °If you had a scopolamine patch placed behind your ear for the management of post- operative nausea and/or vomiting: ° °1. The medication in the patch is effective for 72 hours, after which it should be removed.  Wrap patch in a tissue and discard in the trash. Wash hands thoroughly with soap and water. °2. You may remove the patch earlier than 72 hours if you experience unpleasant side effects which may include dry mouth, dizziness or visual disturbances. °3. Avoid touching the patch. Wash your hands with soap and water after contact with the patch. °  ° °Regional Anesthesia Blocks ° °1. Numbness or the inability to move the "blocked" extremity may last from 3-48 hours after placement. The length of time depends on the medication injected and your individual response to the medication. If the numbness is not going away after 48 hours, call  your surgeon. ° °2. The extremity that is blocked will need to be protected until the numbness is gone and the  Strength has returned. Because you cannot feel it, you will need to take extra care to avoid injury. Because it may be weak, you may have difficulty moving it or using it. You may not know what position it is in without looking at it while the block is in effect. ° °3. For blocks in the legs and feet, returning to weight bearing and walking needs to be done carefully. You will need to wait until the numbness is entirely gone and the strength has returned. You should be able to move your leg and foot normally before you try and bear weight or walk. You will need someone to be with you when you first try to ensure you do not fall and possibly risk injury. ° °4. Bruising and tenderness at the needle site are common side effects and will resolve in a few days. ° °5. Persistent numbness or new problems with movement should be communicated to the surgeon or the [facility] ([phone])/ [facility] ([phone]). ° °

## 2015-05-30 NOTE — Progress Notes (Signed)
Radiology staff performed nuc med inj. Fentanyl IV given for sedation/pain. Pt tol well. VSS. Family called to bedside. Emotional support provided

## 2015-05-30 NOTE — Anesthesia Postprocedure Evaluation (Signed)
  Anesthesia Post-op Note  Patient: Krista Armstrong  Procedure(s) Performed: Procedure(s) (LRB): LEFT PARTIAL MASTECTOMY WITH RADIOACTIVE SEED LOCALIZATION, LEFT AXILLARY SENTINEL LYMPH NODE BIOPSY (Left)  Patient Location: PACU  Anesthesia Type: GA combined with regional for post-op pain  Level of Consciousness: awake and alert   Airway and Oxygen Therapy: Patient Spontanous Breathing  Post-op Pain: mild  Post-op Assessment: Post-op Vital signs reviewed, Patient's Cardiovascular Status Stable, Respiratory Function Stable, Patent Airway and No signs of Nausea or vomiting  Last Vitals:  Filed Vitals:   05/30/15 1230  BP: 134/72  Pulse: 87  Temp:   Resp: 15    Post-op Vital Signs: stable   Complications: No apparent anesthesia complications

## 2015-05-30 NOTE — Op Note (Signed)
Patient Name:           Krista Armstrong   Date of Surgery:        05/30/2015  Pre op Diagnosis:      Invasive ductal carcinoma left breast, lower outer quadrant, receptor positive, HER-2 negative, clinical stage TIb, N0.  Post op Diagnosis:    Same  Procedure:                 Inject blue dye left breast,  left partial mastectomy with radioactive seed localization,  left axillary sentinel node biopsy  Surgeon:                     Edsel Petrin. Dalbert Batman, M.D., FACS  Assistant:                      Or staff  Operative Indications: A 64 year old Afro-American female, referred by Dr. Pamelia Hoit at the breast center of [facility] for evaluation and management of a newly diagnosed invasive cancer of the left breast, lower outer quadrant. She was evaluated in the [facility]  by Dr. [name], Dr. Pablo Ledger and me. Dr. Colin Benton is her PCP. Dr. Oretha Caprice is her gastroenterologist. She has had a right breast biopsy for benign disease in the past, but otherwise no breast problems. Recent screening mammograms show a 7 mm mass in the left breast at 3:30 position, 7 cm from the nipple,  posterior location. Ultrasound of the left axilla is negative. Radiologist said this was palpable but the patient was unaware. 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 No recurrence. GERD. 2 lumbar back surgeries in the past. Hypertension. Family history is positive for breast cancer in her mother, who died of breast cancer. No other breast or ovarian cancer.  She is strongly motivated for breast conservation surgery, and she is a good candidate for that.   Operative Findings:       The marker clip and radioactive seed were found posteriorly in the left breast at the 3:30 position.  The specimen mammogram looked pretty good with a good margin around both markers.  I found only a single sentinel  lymph node but it was very hot and very blue.  There was no other radioactivity or blue dye following removal of the single sentinel node.  Procedure in Detail:          The patient underwent radioactive seed localization by the radiologist last week.  In the holding area I could do hear the seed with the neoprobe  in the left breast lower outer quadrant.  she underwent injection of Tc-99 radio radionuclide by the nuclear medicine technician.  Patient had a left pectoral block by the anesthesia physician.    She was then take the operating room and underwent general anesthesia with LMA device.  Following a surgical timeout and alcohol prep I injected 5 mL of blue dye into the left breast, subareolar area.  This was methylene blue mixed with saline and  the breast was massaged for a few minutes.  The breast and axilla were then prepped and draped in a sterile fashion.  0.5% Marcaine with epinephrine was used as local infiltration anesthesia.     Using the neoprobe I identified the area of maximum I-125 radioactivity in the left breast.  This was at the 3:30 position and somewhat lateral.  A radially oriented incision was made at 3:30 position.  Dissection was carried down into the breast tissue.  I went fairly deeply and removed the specimen.  The specimen was marked with silk sutures and a 6 color ink kit  to orient the pathologist.  The specimen mammogram looked good.  The marker clip at the Seed were  contained within the specimen.  Margins looked good.  The specimen was sent to the lab.  Hemostasis was excellent and achieved with  electrocautery.  The wound was irrigated with saline.  I placed metallic marker clips in the lumpectomy cavity to guide radiotherapy.  The breast tissues were brought back together in 2 separate layers of interrupted 3-0 Vicryls and the skin closed with a running subcuticular 4-0 Monocryl and Dermabond.     A transverse incision was made in the left axilla at the hairline.   Dissection was carried down through the clavipectoral fascia.  Using the neoprobe I identified a single sentinel lymph node, very blue and very hot.  After removing this there was no more radioactivity and no more blue dye level I or level II lymph nodes.  The wound was irrigated with saline.  Hemostasis was excellent.  Tissues were closed with interrupted 3-0 Vicryls and the skin closed with a running subcuticular 4-0 Monocryl and Dermabond.  Breast binder was placed and patient taken to PACU in stable condition.  EBL 20 mL or less.  Counts correct.  Complications none.     Edsel Petrin. Dalbert Batman, M.D., FACS General and Minimally Invasive Surgery Breast and Colorectal Surgery  05/30/2015 12:21 PM

## 2015-05-31 ENCOUNTER — Encounter (HOSPITAL_BASED_OUTPATIENT_CLINIC_OR_DEPARTMENT_OTHER): Payer: Self-pay | Admitting: General Surgery

## 2015-05-31 NOTE — Addendum Note (Signed)
Addendum  created 05/31/15 1737 by Tawni Millers, CRNA   Modules edited: Charges VN

## 2015-06-02 ENCOUNTER — Encounter: Payer: Self-pay | Admitting: *Deleted

## 2015-06-02 NOTE — Progress Notes (Signed)
Ordered oncotype per Dr. [name].  Faxed requisition to pathology and confirmed receipt with [facility].

## 2015-06-06 ENCOUNTER — Telehealth: Payer: Self-pay

## 2015-06-06 ENCOUNTER — Ambulatory Visit: Payer: 59 | Admitting: Oncology

## 2015-06-06 NOTE — Telephone Encounter (Signed)
[facility] was trying to contact pt. Her voice mail is full. They are looking for a 2nd phone number to contact pt. Gave daughter's phone number.

## 2015-06-09 ENCOUNTER — Ambulatory Visit
Admission: RE | Admit: 2015-06-09 | Discharge: 2015-06-09 | Disposition: A | Discharge status: Home / Self Care | Payer: Commercial Managed Care - HMO | Source: Ambulatory Visit | Attending: Radiation Oncology | Admitting: Radiation Oncology

## 2015-06-09 ENCOUNTER — Other Ambulatory Visit: Payer: Self-pay | Admitting: Oncology

## 2015-06-09 ENCOUNTER — Ambulatory Visit: Payer: Commercial Managed Care - HMO

## 2015-06-09 ENCOUNTER — Encounter (HOSPITAL_COMMUNITY): Payer: Self-pay

## 2015-06-09 DIAGNOSIS — C50512 Malignant neoplasm of lower-outer quadrant of left female breast: Secondary | ICD-10-CM | POA: Insufficient documentation

## 2015-06-09 DIAGNOSIS — Z510 Encounter for antineoplastic radiation therapy: Secondary | ICD-10-CM | POA: Insufficient documentation

## 2015-06-13 ENCOUNTER — Encounter: Payer: Self-pay | Admitting: *Deleted

## 2015-06-13 NOTE — Progress Notes (Signed)
Received oncotype score of 20/13%. Copy given to Dr. [name]. Original to HIM for scanning.

## 2015-06-21 ENCOUNTER — Telehealth: Payer: Self-pay | Admitting: Oncology

## 2015-06-21 ENCOUNTER — Ambulatory Visit (HOSPITAL_BASED_OUTPATIENT_CLINIC_OR_DEPARTMENT_OTHER): Payer: Commercial Managed Care - HMO | Admitting: Oncology

## 2015-06-21 ENCOUNTER — Encounter: Payer: Self-pay | Admitting: *Deleted

## 2015-06-21 VITALS — BP 121/74 | HR 81 | Temp 99.0°F | Resp 18 | Ht 66.0 in | Wt 159.9 lb

## 2015-06-21 DIAGNOSIS — Z170 Estrogen receptor positive status [ER+]: Secondary | ICD-10-CM | POA: Diagnosis not present

## 2015-06-21 DIAGNOSIS — C50512 Malignant neoplasm of lower-outer quadrant of left female breast: Secondary | ICD-10-CM | POA: Diagnosis not present

## 2015-06-21 NOTE — Progress Notes (Signed)
[name]  Telephone:(336) [phone] Fax:([phone]) [phone]     ID: Krista Armstrong DOB: [birth year]  MR#: [id number]  IOX#:[id number]  Patient Care Team: Lucretia Kern, DO as PCP - General (Family Medicine) Fanny Skates, MD as Consulting Physician (General Surgery) Chauncey Cruel, MD as Consulting Physician (Oncology) Thea Silversmith, MD as Consulting Physician (Radiation Oncology) Rockwell Germany, RN as Registered Nurse Mauro Kaufmann, RN as Registered Nurse Holley Bouche, NP as Nurse Practitioner (Nurse Practitioner) PCP: Lucretia Kern., DO OTHER MD: Owens Loffler MD  CHIEF COMPLAINT: estrogen receptor positive breast cancer  CURRENT TREATMENT: radiation therapy pending   BREAST CANCER HISTORY: From the original intake note: [name] will Krista Armstrong (pronounced "ROZ-mah") had routine screening mammography at the breast Center 04/12/2015, showing a possible asymmetry in the left breast. Bilateral diagnostic mammography with tomosynthesis the left breast ultrasonography 04/26/2015, showed the breast density to be category B. In the left breast there was a persistent area of focal asymmetry in the lower outer quadrant. On physical exam there was a 1 cm firm nodular area at 3:30 o'clock 7 cm from the nipple. Ultrasound confirmed an irregular hypoechoic mass measuring 7 mm corresponding to the palpable abnormality. There were no abnormal appearing axillary lymph nodes.  Biopsy of the left breast mass in question 05/03/2015 showed ([id number]) and invasive ductal carcinoma, grade 1, estrogen receptor 95% positive, progesterone receptor 1% positive, both with strong staining intensity, with an MIB-1 of 5%, and no HER-2 amplification, the signals ratio being 1.43 and the number per cell 2.50.  The patient's subsequent history is as detailed below.  INTERVAL HISTORY: Krista Armstrong returns today for follow-up of her breast cancer since her last visit here she underwent left lumpectomy and  sentinel lymph node sampling 05/30/2015. The final pathology ([id number]) showed an invasive ductal carcinoma, grade 1, measuring 0.9 cm. The invasive tumor margin was close but negative. The single sentinel lymph node was benign. Repeat HER-2 testing was again negative with a signals ratio of 1.58, and number per cell 2.05. In addition she had an Oncotype sent from the final pathology. This showed a score of 20, predicting a 10 year risk of recurrence outside the breast of 13% if the patient's only systemic therapy was tamoxifen for 5 years (no chemotherapy).  Her case was also discussed at the multidisciplinary breast cancer conference 06/08/2015. At that time it was felt her margins were adequate, and that the patient needed to discuss results of her Oncotype to decide on chemotherapy, before proceeding to radiation after which she would receive anti-estrogens.  REVIEW OF SYSTEMS: [name] tolerated the surgery well, with no unusual pain, fever, or bleeding problems. She denies unusual headaches, visual changes, difficulty swallowing, cough, phlegm production, or pleurisy. Has been no change in bowel or bladder habits. A detailed review of systems today was noncontributory  PAST MEDICAL HISTORY: Past Medical History  Diagnosis Date  . GERD (gastroesophageal reflux disease)   . Hypertension   . Ruptured lumbar disc   . Chronic back pain   . Colon polyps   . Hot flashes     PAST SURGICAL HISTORY: Past Surgical History  Procedure Laterality Date  . Back surgery  2000, 2009  . Stomach surgery  2006  . Back surgery    . Radioactive seed guided mastectomy with axillary sentinel lymph node biopsy Left 05/30/2015    Procedure: LEFT PARTIAL MASTECTOMY WITH RADIOACTIVE SEED LOCALIZATION, LEFT AXILLARY SENTINEL LYMPH NODE BIOPSY;  Surgeon: Fanny Skates, MD;  Location: [facility];  Service: General;  Laterality: Left;    FAMILY HISTORY Family History  Problem Relation Age of Onset    . Breast cancer Mother   . Stroke Father   . Heart disease Maternal Grandmother   . Colon cancer Neg Hx   . Colon polyps Neg Hx   . Diabetes Neg Hx   . Kidney disease Neg Hx   . Esophageal cancer Neg Hx   . Gallbladder disease Neg Hx   the patient's father died at the age of 23 from a ruptured aortic aneurysm. The patient's mother was diagnosed with breast cancer at the age of 76 and died within a year from that problem. The patient had no brothers, one sister. There is no other history of breast or ovarian cancer in the family.  GYNECOLOGIC HISTORY:  No LMP recorded. Patient is postmenopausal. Menarche age 64, first live birth age 64. The patient is GX P3. She stopped having periods in the late 1990s. She did not take hormone replacement. She did take oral contraceptives remotely for approximately 10 years with no complications.  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. She is single. At home she lives with her daughter Krista Armstrong, who is currently unemployed but generally works as an Designer, multimedia out of her home. The patient's 2 other children are Krista Armstrong, who lives in [facility] and works as a better urinary and; and Transport planner, who lives in [place] and works as an Web designer. The patient has 4 grandchildren. She attends [employer].     ADVANCED DIRECTIVES: not in place;  At the initial clinic visit 06/21/2015 the patient was given the appropriate forms to complete and notarize at her discretion.  HEALTH MAINTENANCE: History  Substance Use Topics  . Smoking status: Never Smoker   . Smokeless tobacco: Never Used  . Alcohol Use: 4.2 oz/week    7 Glasses of wine per week     Comment: wine every  night      Colonoscopy:03/16/2015  PAP:  Bone density:never  Lipid panel:  No Known Allergies  Current Outpatient Prescriptions  Medication Sig Dispense Refill  . amLODipine  (NORVASC) 5 MG tablet Take 1 tablet (5 mg total) by mouth daily. 90 tablet 3  . Biotin 1 MG CAPS Take by mouth.    Marland Kitchen HYDROcodone-acetaminophen (NORCO) 5-325 MG per tablet Take 1-2 tablets by mouth every 6 (six) hours as needed for moderate pain or severe pain. 30 tablet 0  . losartan (COZAAR) 50 MG tablet Take 1 tablet (50 mg total) by mouth daily. 90 tablet 3  . omeprazole (PRILOSEC) 40 MG capsule Take 1 capsule (40 mg total) by mouth daily. 30 capsule 3  . VITAMIN E PO Take by mouth.     No current facility-administered medications for this visit.    OBJECTIVE: middle-aged African-American woman in no acute distress Filed Vitals:   06/21/15 1054  BP: 121/74  Pulse: 81  Temp: 99 F (37.2 C)  Resp: 18     Body mass index is 25.82 kg/(m^2).    ECOG FS:0 - Asymptomatic  Sclerae unicteric, pupils round and equal Oropharynx clear and moist-- no thrush or other lesions No cervical or supraclavicular adenopathy Lungs no rales or rhonchi Heart regular rate and rhythm Abd soft, nontender, positive bowel sounds MSK no focal spinal tenderness, no upper extremity lymphedema Neuro: nonfocal, well oriented, appropriate affect Breasts:  The right breast is unremarkable. The left breast is status post lumpectomy. It is slightly swollen. The incision is healing nicely, with no dehiscence, or erythema The cosmetic result is excellent. The left axilla is benign     LAB RESULTS:  CMP     Component Value Date/Time   NA 141 05/11/2015 0756   NA 138 10/19/2014 1205   K 4.1 05/11/2015 0756   K 4.5 10/19/2014 1205   CL 103 10/19/2014 1205   CO2 28 05/11/2015 0756   CO2 27 10/19/2014 1205   GLUCOSE 110 05/11/2015 0756   GLUCOSE 83 10/19/2014 1205   BUN 11.7 05/11/2015 0756   BUN 13 10/19/2014 1205   CREATININE 0.8 05/11/2015 0756   CREATININE 0.7 10/19/2014 1205   CALCIUM 9.4 05/11/2015 0756   CALCIUM 9.3 10/19/2014 1205   PROT 7.6 05/11/2015 0756   ALBUMIN 4.0 05/11/2015 0756   AST 16  05/11/2015 0756   ALT 13 05/11/2015 0756   ALKPHOS 74 05/11/2015 0756   BILITOT 0.53 05/11/2015 0756    INo results found for: SPEP, UPEP  Lab Results  Component Value Date   WBC 5.2 05/11/2015   NEUTROABS 2.9 05/11/2015   HGB 12.1 05/11/2015   HCT 36.8 05/11/2015   MCV 85.7 05/11/2015   PLT 264 05/11/2015      Chemistry      Component Value Date/Time   NA 141 05/11/2015 0756   NA 138 10/19/2014 1205   K 4.1 05/11/2015 0756   K 4.5 10/19/2014 1205   CL 103 10/19/2014 1205   CO2 28 05/11/2015 0756   CO2 27 10/19/2014 1205   BUN 11.7 05/11/2015 0756   BUN 13 10/19/2014 1205   CREATININE 0.8 05/11/2015 0756   CREATININE 0.7 10/19/2014 1205      Component Value Date/Time   CALCIUM 9.4 05/11/2015 0756   CALCIUM 9.3 10/19/2014 1205   ALKPHOS 74 05/11/2015 0756   AST 16 05/11/2015 0756   ALT 13 05/11/2015 0756   BILITOT 0.53 05/11/2015 0756       No results found for: LABCA2  No components found for: [id number]  No results for input(s): INR in the last 168 hours.  Urinalysis No results found for: COLORURINE, APPEARANCEUR, LABSPEC, PHURINE, GLUCOSEU, HGBUR, BILIRUBINUR, KETONESUR, PROTEINUR, UROBILINOGEN, NITRITE, LEUKOCYTESUR  STUDIES: Nm Sentinel Node Inj-no Rpt (breast)  05/30/2015   CLINICAL DATA: Cancer left breast   Sulfur colloid was injected intradermally by the nuclear medicine  technologist for breast cancer sentinel node localization.    Mm Breast Surgical Specimen  05/30/2015   CLINICAL DATA:  Biopsy proven left breast invasive ductal carcinoma. Specimen radiograph following surgical excision.  EXAM: SPECIMEN RADIOGRAPH OF THE LEFT BREAST  COMPARISON:  Previous exam(s).  FINDINGS: Status post excision of the left breast. The radioactive seed and biopsy marker clip are present, completely intact, and were marked for pathology.  IMPRESSION: Specimen radiograph of the left breast.   Electronically Signed   By: Lillia Mountain M.D.   On: 05/30/2015 11:52   Mm Lt  Radioactive Seed Loc Mammo Guide  05/26/2015   CLINICAL DATA:  Patient presents for radioactive seed localization of a small posterior left breast carcinoma previous be biopsied.  EXAM: MAMMOGRAPHIC GUIDED RADIOACTIVE SEED LOCALIZATION OF THE LEFT BREAST  COMPARISON:  Previous exam(s).  FINDINGS: Patient presents for radioactive seed localization prior to surgical excision of a small left breast carcinoma. I met with the patient and we discussed the procedure of seed localization including benefits  and alternatives. We discussed the high likelihood of a successful procedure. We discussed the risks of the procedure including infection, bleeding, tissue injury and further surgery. We discussed the low dose of radioactivity involved in the procedure. Informed, written consent was given.  The usual time-out protocol was performed immediately prior to the procedure.  Using mammographic guidance, sterile technique, 2% lidocaine and an I-125 radioactive seed, the mass, localized by the ribbon shaped biopsy clip was localized using a lateral approach. The follow-up mammogram images confirm the seed in the expected location and were marked for Dr. Dalbert Batman.  Follow-up survey of the patient confirms presence of the radioactive seed.  Order number of I-125 seed:  [id number].  Total activity:  0.253 mCi  Reference Date: 05/20/2015  The patient tolerated the procedure well and was released from the [facility]. She was given instructions regarding seed removal.  IMPRESSION: Radioactive seed localization left breast. No apparent complications.   Electronically Signed   By: Lajean Manes M.D.   On: 05/26/2015 13:52    ASSESSMENT: 64 y.o. [place] woman status post left breast biopsy 05/03/2015 for a clinical T1b N0, stage IA invasive ductal carcinoma, grade 1, estrogen receptor strongly positive, progesterone receptor minimally positive, with an MIB-1 of 5% and no HER-2 amplification.  (1) left lumpectomy and sentinel lymph  node sampling 05/30/2015 showed a pT1b pN0, stage IA invasive ductal carcinoma, grade 1, with close but negative margins, and repeat HER-2 again negative  (2) Oncotype score of 20 (intermediate risk) predicts a 10 year risk of recurrence outside the breast of 13% if the patient's only systemic therapy is tamoxifen for 5 years.   (a) the patient opted against chemotherapy given the very marginal benefits in her situation  (3) radiation to follow   (4) anti-estrogens to follow radiation  PLAN: Kenniyah did well with her surgery and we spent most of today's visit discussing the Oncotype results. She understands that all the patient's and that database took tamoxifen for 5 years. That was the standard of care at that time. The patient's who had chemotherapy in addition to tamoxifen in her group had an additional risk reduction in the 3-4% range.  What this means practically is that if we give 44 women like her chemotherapy, 87 did not benefit because there were a ready "cured" with local treatment and tamoxifen, and an additional 9 women at the cancer come back anyway. Only for women would benefit. This was not a lot of 8 her for the patient and I am very comfortable with her decision to opt out of chemotherapy.  We did discuss the fact that we have better options these days in terms of antiestrogen therapy then tamoxifen for 5 years. These include anastrozole for 5 years or tamoxifen for 10 years, to give only 2 of many possible examples. These generally can reduce the risk of recurrence further by about 3%.  We then discussed the possible toxicities, side effects and complications of these agents.  She is now ready to proceed to radiation. I'm going to see her again approximately one month after she completes her radiation treatments. We will make a decision regarding which antiestrogen to start on at that time. In the meantime she knows to call for any problems that may develop before her next visit  here.  Chauncey Cruel, MD   06/21/2015 11:02 AM Medical Oncology and Hematology [facility] [address] [place], [place] [address] Tel. [phone]    Fax. [phone]

## 2015-06-21 NOTE — Telephone Encounter (Signed)
Appointments made and avs printed for patient,message to dr [name] as November is the first avail

## 2015-06-22 ENCOUNTER — Telehealth: Payer: Self-pay | Admitting: Oncology

## 2015-06-22 NOTE — Telephone Encounter (Signed)
Dr [name] had advised on a new appointment per staff message and a new calendar and letter was mailed to the patient as her voicemail was full

## 2015-06-23 ENCOUNTER — Ambulatory Visit
Admission: RE | Admit: 2015-06-23 | Discharge: 2015-06-23 | Disposition: A | Discharge status: Home / Self Care | Payer: Commercial Managed Care - HMO | Source: Ambulatory Visit | Attending: Radiation Oncology | Admitting: Radiation Oncology

## 2015-06-23 ENCOUNTER — Encounter: Payer: Self-pay | Admitting: Radiation Oncology

## 2015-06-23 VITALS — BP 117/70 | HR 79 | Temp 98.6°F | Resp 20 | Wt 162.4 lb

## 2015-06-23 DIAGNOSIS — Z510 Encounter for antineoplastic radiation therapy: Secondary | ICD-10-CM | POA: Diagnosis not present

## 2015-06-23 DIAGNOSIS — C50512 Malignant neoplasm of lower-outer quadrant of left female breast: Secondary | ICD-10-CM | POA: Diagnosis present

## 2015-06-23 HISTORY — DX: Malignant neoplasm of unspecified site of unspecified female breast: C50.919

## 2015-06-23 NOTE — Progress Notes (Signed)
Location of Breast Cancer:Left Breast  Lower Outer Quadrant , 3:30 o'clock position  Histology per Pathology Report: Diagnosis 05/03/15: Breast, left, needle core biopsy, 3:30 o'clock, 7 CMFN - INVASIVE DUCTAL CARCINOMA, SEE COMMENT.- CALCIFICATIONS PRESENT.  Receptor Status: ER(  + 95%), PR (  + 1% ), Her2-neu ( neg KI-67 5%  )  Did patient present with symptoms (if so, please note symptoms) or was this found on screening mammography?: Routine screening  Past/Anticipated interventions by surgeon, if [facility]:[id number] 05/30/15: Dr/ Renelda Loma Ingram,MD 1. Breast, lumpectomy, Left - INVASIVE GRADE I DUCTAL CARCINOMA SPANNING 0.9 CM IN GREATEST DIMENSION.- ASSOCIATED LOW GRADE DUCTAL CARCINOMA IN SITU AND CALCIFICATIONS. - INVASIVE TUMOR IS EXTREMELY CLOSE (APPROXIMATELY 0.1 CM) TO LATERAL MARGIN- OTHER MARGINS ARE NEGATIVE.- SEE ONCOLOGY TEMPLATE. 2. Lymph node, sentinel, biopsy, Left axillary - ONE BENIGN LYMPH NODE WITH NO TUMOR SEEN (0/1). Microscopic Comment 1. BREAST, INVASIVE TUMOR, WITH LYMPH NODES PRESENT 1 of 3  Past/Anticipated interventions by medical oncology, if any: Chemotherapy : Dr. [name]   Seen 05/14/15,  Referral Oncotype testing, results= 20; f/u with Dr. [name] on 09/15/15  Lymphedema issues, if any: no   Pain issues, if any:  Chronic Back pain, has some tenderness and shooting pain.  SAFETY ISSUES:  Prior radiation? NO  Pacemaker/ICD? NO  Possible current pregnancy? N/A.lmp in her 53's Is the patient on methotrexate?  NO Current Complaints / other details:   Seen in Multidisciplinary Breast Clinic 05/11/2015, Single,  menarche age 77, G47P3,1st live birth age 65,  Birth control pills 10 years quit 1989,No HRT,never smoker or smokeless tobacco use, drinks  Glass wine nightly ,hot flashes, HX right breast bx  2001  =benign   Mother -Breast Cancer diagnosed age 30 died with in 52 months., Father-Stroke, Maternal grandmother-heart disease  Allergies:  NKA    Rebecca Eaton, RN 06/23/2015,11:49 AM

## 2015-06-23 NOTE — Progress Notes (Signed)
   Department of Radiation Oncology  Phone:  [phone] Fax:        [phone]   Name: Krista Armstrong MRN: [id number]  DOB: Dec 18, 1950  Date: 06/23/2015  Follow Up Visit Note  Diagnosis: Breast cancer of lower-outer quadrant of left female breast   Staging form: Breast, AJCC 7th Edition     Clinical stage from 05/11/2015: Stage IA (T1b, N0, M0) - Unsigned   Interval History: Krista Armstrong presents today for routine follow-up appointment with radiation oncology after her most recent surgery of a lumpectomy on 05/30/2015. She underwent her lumpectomy The patient stated that she is still experiencing some numbness in the left breast around the nipple. She had negative margins and lymph node was negative.  Her oncotype score was low and she will not require chemotherapy. Krista Armstrong was cold during today's visit and provided with a warm blanket.The patient is excited about her most recent move into her new home, which took place on 05/23/2015. The patient currently works and takes care of her adult daughter and grandchildren.  Physical Exam:  Filed Vitals:   06/23/15 1555  BP: 117/70  Pulse: 79  Temp: 98.6 F (37 C)  Resp: 20  Weight: 162 lb 6.4 oz (73.664 kg)  SpO2: 100%   Breast: Excellent cosmetic results observed with surgical glue still in place. Overall, the site of surgery is healing well at this time.  IMPRESSION: Krista Armstrong is a 64 y.o. female presenting to clinic in regards to her cancer of the left female breast and her recovery process from her most recent surgery of a lumpectomy on 05/30/2015. Chemotherapy is not recommended at this time.   PLAN: The patient has been advised of her CT scan and planning sessions to take place on Wednesday, August 10th of 2016 at 3:00pm. Radiation therapy treatments in a 4-6 week time frame are to follow. Techniques to assist with her radiation treatment, including breath hold was explained. The traditional process in which patients experience when undergoing  radiation therapy was discussed. Common symptoms and healthy methods to manage these symptoms if they are to occur was addressed. The patient was advised to make Dr. Pablo Ledger aware if she is to become pregnant prior to radiation therapy. All questions and concerns vocalized were fully addressed. The patient will obtain paperwork for intermittent disability. If the patient develops any further questions or concerns, she has been encouraged to contact Dr. Pablo Ledger. All consent paperwork was reviewed and signed.  This document serves as a record of services personally performed by Thea Silversmith , MD. It was created on her behalf by Lenn Cal, a trained medical scribe. The creation of this record is based on the scribe's personal observations and the provider's statements to them. This document has been checked and approved by the attending provider.   ------------------------------------------------  Thea Silversmith, MD

## 2015-06-29 ENCOUNTER — Ambulatory Visit
Admission: RE | Admit: 2015-06-29 | Discharge: 2015-06-29 | Disposition: A | Discharge status: Home / Self Care | Payer: Commercial Managed Care - HMO | Source: Ambulatory Visit | Attending: Radiation Oncology | Admitting: Radiation Oncology

## 2015-06-29 DIAGNOSIS — Z510 Encounter for antineoplastic radiation therapy: Secondary | ICD-10-CM | POA: Diagnosis not present

## 2015-06-29 DIAGNOSIS — C50512 Malignant neoplasm of lower-outer quadrant of left female breast: Secondary | ICD-10-CM

## 2015-06-29 MED ORDER — ALPRAZOLAM 0.25 MG PO TABS: 0.2500 mg | ORAL_TABLET | Freq: Every evening | ORAL | Status: DC | PRN

## 2015-06-29 NOTE — Progress Notes (Signed)
Name: Krista Armstrong   MRN: [id number]  Date:  06/29/2015  DOB: 23-Sep-1951  Status:outpatient   DIAGNOSIS: Left Breast cancer.  CONSENT VERIFIED: yes SET UP: Patient is setup supine  IMMOBILIZATION:  The following immobilization was used:Custom Moldable Pillow, breast board.  NARRATIVE: [name] was brought to the [place].  Identity was confirmed.  All relevant records and images related to the planned course of therapy were reviewed.  Then, the patient was positioned in a stable reproducible clinical set-up for radiation therapy.  Wires were placed to delineate the clinical extent of breast tissue. A wire was placed on the scar as well.  CT images were obtained.  An isocenter was placed. Skin markings were placed.  The position of the heart was then analyzed.  Due to the proximity of the heart to the chest wall, I felt she would benefit from deep inspiration breath hold for cardiac sparing.  She was then coached and rescanned in the breath hold position.  Acceptable cardiac sparing was achieved. The CT images were loaded into the planning software where the target and avoidance structures were contoured.  The radiation prescription was entered and confirmed. The patient was discharged in stable condition and tolerated simulation well.    TREATMENT PLANNING NOTE/3D Simulation Note Treatment planning then occurred. I have requested : MLC's, isodose plan, basic dose calculation  3D simulation was performed.  I personally designed and supervised the construction of 3 medically necessary complex treatment devices in the form of MLCs which will be used for beam modification and to protect critical structures including the heart and lung as well as the immobilization device which is necessary for reproducible set up.  I have requested a dose volume histogram of the heart, lung and tumor cavity.    Special treatment procedure was performed today due to the extra time and effort  required by myself to plan and prepare this patient for deep inspiration breath hold technique.  I have determined cardiac sparing to be of benefit to this patient to prevent long term cardiac damage due to radiation of the heart.  Bellows were placed on the patient's abdomen. To facilitate cardiac sparing, the patient was coached by the radiation therapists on breath hold techniques and breathing practice was performed. Practice waveforms were obtained. The patient was then scanned while maintaining breath hold in the treatment position.  This image was then transferred over to the imaging specialist. The imaging specialist then created a fusion of the free breathing and breath hold scans using the chest wall as the stable structure. I personally reviewed the fusion in axial, coronal and sagittal image planes.  Excellent cardiac sparing was obtained.  I felt the patient is an appropriate candidate for breath hold and the patient will be treated as such.  The image fusion was then reviewed with the patient to reinforce the necessity of reproducible breath hold.

## 2015-06-30 ENCOUNTER — Telehealth: Payer: Self-pay

## 2015-06-30 NOTE — Telephone Encounter (Signed)
Script for alprazolam 0.25 mg called into [facility]. [phone] to [employer].

## 2015-07-05 DIAGNOSIS — Z510 Encounter for antineoplastic radiation therapy: Secondary | ICD-10-CM | POA: Diagnosis not present

## 2015-07-06 ENCOUNTER — Other Ambulatory Visit: Payer: Self-pay | Admitting: Family Medicine

## 2015-07-06 ENCOUNTER — Ambulatory Visit
Admission: RE | Admit: 2015-07-06 | Discharge: 2015-07-06 | Disposition: A | Discharge status: Home / Self Care | Payer: Commercial Managed Care - HMO | Source: Ambulatory Visit | Attending: Radiation Oncology | Admitting: Radiation Oncology

## 2015-07-06 DIAGNOSIS — Z510 Encounter for antineoplastic radiation therapy: Secondary | ICD-10-CM | POA: Diagnosis not present

## 2015-07-07 ENCOUNTER — Ambulatory Visit: Admission: RE | Admit: 2015-07-07 | Payer: Commercial Managed Care - HMO | Source: Ambulatory Visit

## 2015-07-07 ENCOUNTER — Ambulatory Visit
Admission: RE | Admit: 2015-07-07 | Discharge: 2015-07-07 | Disposition: A | Discharge status: Home / Self Care | Payer: Commercial Managed Care - HMO | Source: Ambulatory Visit | Attending: Radiation Oncology | Admitting: Radiation Oncology

## 2015-07-07 DIAGNOSIS — Z510 Encounter for antineoplastic radiation therapy: Secondary | ICD-10-CM | POA: Diagnosis not present

## 2015-07-08 ENCOUNTER — Ambulatory Visit
Admission: RE | Admit: 2015-07-08 | Discharge: 2015-07-08 | Disposition: A | Discharge status: Home / Self Care | Payer: Commercial Managed Care - HMO | Source: Ambulatory Visit | Attending: Radiation Oncology | Admitting: Radiation Oncology

## 2015-07-08 DIAGNOSIS — Z510 Encounter for antineoplastic radiation therapy: Secondary | ICD-10-CM | POA: Diagnosis not present

## 2015-07-11 ENCOUNTER — Ambulatory Visit
Admission: RE | Admit: 2015-07-11 | Discharge: 2015-07-11 | Disposition: A | Discharge status: Home / Self Care | Payer: Commercial Managed Care - HMO | Source: Ambulatory Visit | Attending: Radiation Oncology | Admitting: Radiation Oncology

## 2015-07-11 ENCOUNTER — Telehealth: Payer: Self-pay | Admitting: *Deleted

## 2015-07-11 DIAGNOSIS — Z510 Encounter for antineoplastic radiation therapy: Secondary | ICD-10-CM | POA: Diagnosis not present

## 2015-07-11 NOTE — Telephone Encounter (Signed)
Left message for a return phone call to follow up after starting radiation therapy.  Awaiting patient response.

## 2015-07-12 ENCOUNTER — Ambulatory Visit
Admission: RE | Admit: 2015-07-12 | Discharge: 2015-07-12 | Disposition: A | Discharge status: Home / Self Care | Payer: Commercial Managed Care - HMO | Source: Ambulatory Visit | Attending: Radiation Oncology | Admitting: Radiation Oncology

## 2015-07-12 VITALS — BP 113/76 | HR 78 | Temp 98.4°F | Wt 161.5 lb

## 2015-07-12 DIAGNOSIS — C50512 Malignant neoplasm of lower-outer quadrant of left female breast: Secondary | ICD-10-CM

## 2015-07-12 DIAGNOSIS — Z510 Encounter for antineoplastic radiation therapy: Secondary | ICD-10-CM | POA: Diagnosis not present

## 2015-07-12 NOTE — Progress Notes (Signed)
Weekly Management Note Current Dose:10.68   Gy  Projected Dose: 42.72 Gy   Narrative:  The patient presents for routine under treatment assessment.  CBCT/MVCT images/Port film x-rays were reviewed.  The chart was checked. Doing well. No complaints except fatigue.   Physical Findings: Weight: 161 lb 8 oz (73.256 kg). Unchanged  Impression:  The patient is tolerating radiation.  Plan:  Continue treatment as planned.

## 2015-07-13 ENCOUNTER — Ambulatory Visit
Admission: RE | Admit: 2015-07-13 | Discharge: 2015-07-13 | Disposition: A | Discharge status: Home / Self Care | Payer: Commercial Managed Care - HMO | Source: Ambulatory Visit | Attending: Radiation Oncology | Admitting: Radiation Oncology

## 2015-07-13 DIAGNOSIS — Z510 Encounter for antineoplastic radiation therapy: Secondary | ICD-10-CM | POA: Diagnosis not present

## 2015-07-14 ENCOUNTER — Ambulatory Visit
Admission: RE | Admit: 2015-07-14 | Discharge: 2015-07-14 | Disposition: A | Discharge status: Home / Self Care | Payer: Commercial Managed Care - HMO | Source: Ambulatory Visit | Attending: Radiation Oncology | Admitting: Radiation Oncology

## 2015-07-14 DIAGNOSIS — Z510 Encounter for antineoplastic radiation therapy: Secondary | ICD-10-CM | POA: Diagnosis not present

## 2015-07-15 ENCOUNTER — Ambulatory Visit
Admission: RE | Admit: 2015-07-15 | Discharge: 2015-07-15 | Disposition: A | Discharge status: Home / Self Care | Payer: Commercial Managed Care - HMO | Source: Ambulatory Visit | Attending: Radiation Oncology | Admitting: Radiation Oncology

## 2015-07-15 DIAGNOSIS — C50512 Malignant neoplasm of lower-outer quadrant of left female breast: Secondary | ICD-10-CM

## 2015-07-15 DIAGNOSIS — Z510 Encounter for antineoplastic radiation therapy: Secondary | ICD-10-CM | POA: Diagnosis not present

## 2015-07-15 MED ORDER — ALRA NON-METALLIC DEODORANT (RAD-ONC)
1.0000 "application " | Freq: Once | TOPICAL | Status: AC
  Administered 2015-07-15: 1 via TOPICAL

## 2015-07-15 MED ORDER — RADIAPLEXRX EX GEL
Freq: Once | CUTANEOUS | Status: AC
  Administered 2015-07-15: 10:00:00 via TOPICAL

## 2015-07-18 ENCOUNTER — Ambulatory Visit
Admission: RE | Admit: 2015-07-18 | Discharge: 2015-07-18 | Disposition: A | Discharge status: Home / Self Care | Payer: Commercial Managed Care - HMO | Source: Ambulatory Visit | Attending: Radiation Oncology | Admitting: Radiation Oncology

## 2015-07-18 DIAGNOSIS — Z510 Encounter for antineoplastic radiation therapy: Secondary | ICD-10-CM | POA: Diagnosis not present

## 2015-07-19 ENCOUNTER — Ambulatory Visit
Admission: RE | Admit: 2015-07-19 | Discharge: 2015-07-19 | Disposition: A | Discharge status: Home / Self Care | Payer: Commercial Managed Care - HMO | Source: Ambulatory Visit | Attending: Radiation Oncology | Admitting: Radiation Oncology

## 2015-07-19 VITALS — BP 104/73 | HR 78 | Temp 98.5°F | Wt 160.5 lb

## 2015-07-19 DIAGNOSIS — C50512 Malignant neoplasm of lower-outer quadrant of left female breast: Secondary | ICD-10-CM

## 2015-07-19 DIAGNOSIS — Z510 Encounter for antineoplastic radiation therapy: Secondary | ICD-10-CM | POA: Diagnosis not present

## 2015-07-19 NOTE — Progress Notes (Signed)
Weekly assessment of radiation to left breast.Completed 9 of 16 fractions. No skin changes. BP 104/73 mmHg  Pulse 78  Temp(Src) 98.5 F (36.9 C)  Wt 160 lb 8 oz (72.802 kg)

## 2015-07-19 NOTE — Progress Notes (Signed)
Weekly Management Note Current Dose:24.03   Gy  Projected Dose: 42.72 Gy   Narrative:  The patient presents for routine under treatment assessment.  CBCT/MVCT images/Port film x-rays were reviewed.  The chart was checked. Doing well. No complaints except fatigue.   Physical Findings: Weight: 160 lb 8 oz (72.802 kg). Unchanged  Impression:  The patient is tolerating radiation.  Plan:  Continue treatment as planned. Continue radiaplex.

## 2015-07-20 ENCOUNTER — Ambulatory Visit
Admission: RE | Admit: 2015-07-20 | Discharge: 2015-07-20 | Disposition: A | Discharge status: Home / Self Care | Payer: Commercial Managed Care - HMO | Source: Ambulatory Visit | Attending: Radiation Oncology | Admitting: Radiation Oncology

## 2015-07-20 DIAGNOSIS — Z510 Encounter for antineoplastic radiation therapy: Secondary | ICD-10-CM | POA: Diagnosis not present

## 2015-07-21 ENCOUNTER — Ambulatory Visit
Admission: RE | Admit: 2015-07-21 | Discharge: 2015-07-21 | Disposition: A | Discharge status: Home / Self Care | Payer: Commercial Managed Care - HMO | Source: Ambulatory Visit | Attending: Radiation Oncology | Admitting: Radiation Oncology

## 2015-07-21 DIAGNOSIS — Z510 Encounter for antineoplastic radiation therapy: Secondary | ICD-10-CM | POA: Diagnosis not present

## 2015-07-22 ENCOUNTER — Ambulatory Visit
Admission: RE | Admit: 2015-07-22 | Discharge: 2015-07-22 | Disposition: A | Discharge status: Home / Self Care | Payer: Commercial Managed Care - HMO | Source: Ambulatory Visit | Attending: Radiation Oncology | Admitting: Radiation Oncology

## 2015-07-22 DIAGNOSIS — Z510 Encounter for antineoplastic radiation therapy: Secondary | ICD-10-CM | POA: Diagnosis not present

## 2015-07-25 ENCOUNTER — Other Ambulatory Visit: Payer: Self-pay | Admitting: Family Medicine

## 2015-07-26 ENCOUNTER — Ambulatory Visit
Admission: RE | Admit: 2015-07-26 | Discharge: 2015-07-26 | Disposition: A | Discharge status: Home / Self Care | Payer: Commercial Managed Care - HMO | Source: Ambulatory Visit | Attending: Radiation Oncology | Admitting: Radiation Oncology

## 2015-07-26 DIAGNOSIS — Z510 Encounter for antineoplastic radiation therapy: Secondary | ICD-10-CM | POA: Diagnosis not present

## 2015-07-26 DIAGNOSIS — C50512 Malignant neoplasm of lower-outer quadrant of left female breast: Secondary | ICD-10-CM

## 2015-07-26 NOTE — Progress Notes (Signed)
Weekly Management Note Current Dose: 34.71   Gy  Projected Dose: 42.72 Gy   Narrative:  The patient presents for routine under treatment assessment.  CBCT/MVCT images/Port film x-rays were reviewed.  The chart was checked. Doing well. No complaints except fatigue and a cold. Saw on treatment machine for electron set up.   Physical Findings: Weight:  . Unchanged  Impression:  The patient is tolerating radiation.  Plan:  Continue treatment as planned. Continue radiaplex.

## 2015-07-27 ENCOUNTER — Other Ambulatory Visit: Payer: Self-pay | Admitting: Family Medicine

## 2015-07-27 ENCOUNTER — Ambulatory Visit
Admission: RE | Admit: 2015-07-27 | Discharge: 2015-07-27 | Disposition: A | Discharge status: Home / Self Care | Payer: Commercial Managed Care - HMO | Source: Ambulatory Visit | Attending: Radiation Oncology | Admitting: Radiation Oncology

## 2015-07-27 DIAGNOSIS — Z510 Encounter for antineoplastic radiation therapy: Secondary | ICD-10-CM | POA: Diagnosis not present

## 2015-07-28 ENCOUNTER — Ambulatory Visit
Admission: RE | Admit: 2015-07-28 | Discharge: 2015-07-28 | Disposition: A | Discharge status: Home / Self Care | Payer: Commercial Managed Care - HMO | Source: Ambulatory Visit | Attending: Radiation Oncology | Admitting: Radiation Oncology

## 2015-07-28 DIAGNOSIS — Z510 Encounter for antineoplastic radiation therapy: Secondary | ICD-10-CM | POA: Diagnosis not present

## 2015-07-29 ENCOUNTER — Ambulatory Visit: Payer: Commercial Managed Care - HMO

## 2015-08-01 ENCOUNTER — Ambulatory Visit
Admission: RE | Admit: 2015-08-01 | Discharge: 2015-08-01 | Disposition: A | Discharge status: Home / Self Care | Payer: Commercial Managed Care - HMO | Source: Ambulatory Visit | Attending: Radiation Oncology | Admitting: Radiation Oncology

## 2015-08-01 DIAGNOSIS — Z510 Encounter for antineoplastic radiation therapy: Secondary | ICD-10-CM | POA: Diagnosis not present

## 2015-08-02 ENCOUNTER — Ambulatory Visit: Payer: Commercial Managed Care - HMO | Admitting: Radiation Oncology

## 2015-08-02 ENCOUNTER — Ambulatory Visit
Admission: RE | Admit: 2015-08-02 | Discharge: 2015-08-02 | Disposition: A | Discharge status: Home / Self Care | Payer: Commercial Managed Care - HMO | Source: Ambulatory Visit | Attending: Radiation Oncology | Admitting: Radiation Oncology

## 2015-08-02 DIAGNOSIS — Z510 Encounter for antineoplastic radiation therapy: Secondary | ICD-10-CM | POA: Diagnosis not present

## 2015-08-03 ENCOUNTER — Ambulatory Visit
Admission: RE | Admit: 2015-08-03 | Discharge: 2015-08-03 | Disposition: A | Discharge status: Home / Self Care | Payer: Commercial Managed Care - HMO | Source: Ambulatory Visit | Attending: Radiation Oncology | Admitting: Radiation Oncology

## 2015-08-04 ENCOUNTER — Ambulatory Visit
Admission: RE | Admit: 2015-08-04 | Discharge: 2015-08-04 | Disposition: A | Discharge status: Home / Self Care | Payer: Commercial Managed Care - HMO | Source: Ambulatory Visit | Attending: Radiation Oncology | Admitting: Radiation Oncology

## 2015-08-04 DIAGNOSIS — C50512 Malignant neoplasm of lower-outer quadrant of left female breast: Secondary | ICD-10-CM

## 2015-08-04 DIAGNOSIS — Z510 Encounter for antineoplastic radiation therapy: Secondary | ICD-10-CM | POA: Diagnosis not present

## 2015-08-04 MED ORDER — ALPRAZOLAM 0.25 MG PO TABS: 0.2500 mg | ORAL_TABLET | Freq: Every evening | ORAL | Status: DC | PRN

## 2015-08-05 ENCOUNTER — Ambulatory Visit
Admission: RE | Admit: 2015-08-05 | Discharge: 2015-08-05 | Disposition: A | Discharge status: Home / Self Care | Payer: Commercial Managed Care - HMO | Source: Ambulatory Visit | Attending: Radiation Oncology | Admitting: Radiation Oncology

## 2015-08-05 DIAGNOSIS — Z510 Encounter for antineoplastic radiation therapy: Secondary | ICD-10-CM | POA: Diagnosis not present

## 2015-08-08 ENCOUNTER — Ambulatory Visit
Admission: RE | Admit: 2015-08-08 | Discharge: 2015-08-08 | Disposition: A | Discharge status: Home / Self Care | Payer: Commercial Managed Care - HMO | Source: Ambulatory Visit | Attending: Radiation Oncology | Admitting: Radiation Oncology

## 2015-08-08 ENCOUNTER — Other Ambulatory Visit: Payer: Self-pay | Admitting: Family Medicine

## 2015-08-08 ENCOUNTER — Ambulatory Visit: Payer: Commercial Managed Care - HMO

## 2015-08-08 DIAGNOSIS — Z510 Encounter for antineoplastic radiation therapy: Secondary | ICD-10-CM | POA: Diagnosis not present

## 2015-08-09 ENCOUNTER — Ambulatory Visit
Admission: RE | Admit: 2015-08-09 | Discharge: 2015-08-09 | Disposition: A | Discharge status: Home / Self Care | Payer: Commercial Managed Care - HMO | Source: Ambulatory Visit | Attending: Radiation Oncology | Admitting: Radiation Oncology

## 2015-08-09 DIAGNOSIS — C50512 Malignant neoplasm of lower-outer quadrant of left female breast: Secondary | ICD-10-CM

## 2015-08-09 DIAGNOSIS — Z510 Encounter for antineoplastic radiation therapy: Secondary | ICD-10-CM | POA: Diagnosis not present

## 2015-08-11 ENCOUNTER — Telehealth: Payer: Self-pay | Admitting: *Deleted

## 2015-08-11 ENCOUNTER — Telehealth: Payer: Self-pay | Admitting: Oncology

## 2015-08-11 DIAGNOSIS — C50512 Malignant neoplasm of lower-outer quadrant of left female breast: Secondary | ICD-10-CM

## 2015-08-11 NOTE — Telephone Encounter (Signed)
Left message for a return phone call to follow up with patient after completion of XRT.  Awaiting patient response.  Referred to survivorship.

## 2015-08-11 NOTE — Telephone Encounter (Signed)
Called and left a message with survivorship anne

## 2015-08-16 ENCOUNTER — Telehealth: Payer: Self-pay | Admitting: Oncology

## 2015-08-16 NOTE — Telephone Encounter (Signed)
Spoke with patient and confirmed appointment change from 10/27 to 11/01

## 2015-08-17 ENCOUNTER — Telehealth: Payer: Self-pay | Admitting: Genetic Counselor

## 2015-08-17 NOTE — Telephone Encounter (Signed)
Genetic appt -s/w patient and gave genetic appt for 10/012 @ 1 w/Karen Florene Glen

## 2015-08-18 ENCOUNTER — Encounter: Payer: Commercial Managed Care - HMO | Admitting: Nurse Practitioner

## 2015-08-18 ENCOUNTER — Encounter: Payer: Self-pay | Admitting: Radiation Oncology

## 2015-08-18 NOTE — Progress Notes (Signed)
  Radiation Oncology         [phone]) [phone] ________________________________  Name: Krista Armstrong MRN: [id number]  Date: 08/18/2015  DOB: [birth year]  End of Treatment Note  Diagnosis:   Breast cancer of lower-outer quadrant of left female breast   Staging form: Breast, AJCC 7th Edition     Clinical stage from 05/11/2015: Stage IA (T1b, N0, M0) - Unsigned    Indication for treatment:  Curative     Radiation treatment dates: 07/07/2015-08/09/2015  Site/dose:   Left breast/ 42.72 Gy at 2.67 Gy per fraction x 16 fractions.  Left breast boost/ 10 Gy at 2 Gy per fraction x 5 fractions  Beams/energy:  Opposed tangents with 3D conformal breath hold / 6 MV photons Enface electrons / 18 MeV  Narrative: The patient tolerated radiation treatment relatively well. She had insomnia during treatment, which I provided Xanex. She also had fatigue and minimal skin irritation. She was able to work through treatment.  Plan: The patient has completed radiation treatment. The patient will return to radiation oncology clinic for routine followup in one month. I advised them to call or return sooner if they have any questions or concerns related to their recovery or treatment.  This document serves as a record of services personally performed by Thea Silversmith , MD. It was created on her behalf by Lenn Cal, a trained medical scribe. The creation of this record is based on the scribe's personal observations and the provider's statements to them. This document has been checked and approved by the attending provider.    ------------------------------------------------  Thea Silversmith, MD

## 2015-08-18 NOTE — Progress Notes (Signed)
Name: Krista Armstrong   MRN: [id number]  Date:  07/20/2015   DOB: 22-Aug-1951  Status:outpatient    DIAGNOSIS: Breast cancer of lower-outer quadrant of left female breast   Staging form: Breast, AJCC 7th Edition     Clinical stage from 05/11/2015: Stage IA (T1b, N0, M0) - Unsigned   CONSENT VERIFIED: yes   SET UP: Patient is setup supine   IMMOBILIZATION:  The following immobilization was used:Custom Moldable Pillow, breast board.   NARRATIVE: Krista Armstrong underwent complex simulation and treatment planning for her boost treatment today.  Her tumor volume was outlined on the planning CT scan. The depth of her cavity was felt to be appropriate for treatment with electrons    18  MeV electrons will be prescribed to the 100%  isodose line.   I personally oversaw and approved the construction of a unique block which will be used for beam modification purposes.  An isodose plan is requested.   This document serves as a record of services personally performed by Thea Silversmith , MD. It was created on her behalf by Lenn Cal, a trained medical scribe. The creation of this record is based on the scribe's personal observations and the Laekyn Rayos's statements to them. This document has been checked and approved by the attending Martinique Pizzimenti.   ------------------------------------------------  Thea Silversmith, MD

## 2015-08-18 NOTE — Progress Notes (Signed)
Radiation Oncology         [phone]) [phone] ________________________________  Name: Krista Armstrong      MRN: [id number]          Date: 06/29/2015             DOB: [birth year]  Optical Surface Tracking Plan:  Since intensity modulated radiotherapy (IMRT) and 3D conformal radiation treatment methods are predicated on accurate and precise positioning for treatment, intrafraction motion monitoring is medically necessary to ensure accurate and safe treatment delivery.  The ability to quantify intrafraction motion without excessive ionizing radiation dose can only be performed with optical surface tracking. Accordingly, surface imaging offers the opportunity to obtain 3D measurements of patient position throughout IMRT and 3D treatments without excessive radiation exposure.  I am ordering optical surface tracking for this patient's upcoming course of radiotherapy. ________________________________ Signature   Reference:   Ursula Alert, J, et al. Surface imaging-based analysis of intrafraction motion for breast radiotherapy patients.Journal of [place], n. 6, nov. 2014. ISSN [id number].   Available at: <http://www.jacmp.org/index.php/jacmp/article/view/4957>.   This document serves as a record of services personally performed by Thea Silversmith , MD. It was created on her behalf by Lenn Cal, a trained medical scribe. The creation of this record is based on the scribe's personal observations and the provider's statements to them. This document has been checked and approved by the attending provider.   ------------------------------------------------  Thea Silversmith, MD

## 2015-08-24 ENCOUNTER — Encounter: Payer: Commercial Managed Care - HMO | Admitting: Nurse Practitioner

## 2015-08-24 ENCOUNTER — Telehealth: Payer: Self-pay | Admitting: *Deleted

## 2015-08-24 ENCOUNTER — Other Ambulatory Visit: Payer: Self-pay | Admitting: Radiation Oncology

## 2015-08-24 DIAGNOSIS — C50512 Malignant neoplasm of lower-outer quadrant of left female breast: Secondary | ICD-10-CM

## 2015-08-24 NOTE — Telephone Encounter (Signed)
CALLED PATIENT TO INFORM OF APPT. FOR PT ON 08-30-15- ARRIVAL TIME - 3:10 PM @ [place], SPOKE WITH PATIENT AND SHE IS GOOD WITH THIS APPT.

## 2015-08-26 ENCOUNTER — Telehealth: Payer: Self-pay | Admitting: *Deleted

## 2015-08-26 NOTE — Telephone Encounter (Signed)
LM for pt to return call- missed appt on 10/5 with Chestine Spore, NP for survivorship- reschedule. Pt had some concerns about arm pain. Offer [facility] if pt still has pain.

## 2015-08-30 ENCOUNTER — Other Ambulatory Visit: Payer: Self-pay | Admitting: Family Medicine

## 2015-08-30 ENCOUNTER — Ambulatory Visit: Payer: Commercial Managed Care - HMO | Admitting: Physical Therapy

## 2015-08-31 ENCOUNTER — Ambulatory Visit (HOSPITAL_BASED_OUTPATIENT_CLINIC_OR_DEPARTMENT_OTHER): Payer: Commercial Managed Care - HMO | Admitting: Genetic Counselor

## 2015-08-31 ENCOUNTER — Other Ambulatory Visit: Payer: Commercial Managed Care - HMO

## 2015-08-31 ENCOUNTER — Encounter: Payer: Self-pay | Admitting: Genetic Counselor

## 2015-08-31 DIAGNOSIS — C50512 Malignant neoplasm of lower-outer quadrant of left female breast: Secondary | ICD-10-CM | POA: Diagnosis not present

## 2015-08-31 DIAGNOSIS — Z315 Encounter for genetic counseling: Secondary | ICD-10-CM | POA: Diagnosis not present

## 2015-08-31 DIAGNOSIS — Z803 Family history of malignant neoplasm of breast: Secondary | ICD-10-CM

## 2015-08-31 NOTE — Progress Notes (Signed)
REFERRING PROVIDER: Lucretia Kern, DO [place], [place] [id number]   Lurline Del, MD  PRIMARY PROVIDER:  Lucretia Kern., DO  PRIMARY REASON FOR VISIT:  1. Breast cancer of lower-outer quadrant of left female breast ([place])   2. Family history of breast cancer      HISTORY OF PRESENT ILLNESS:   Krista Armstrong, a 64 y.o. female, was seen for a [facility] cancer genetics consultation at the request of Dr. [name] due to a personal and family history of cancer.  Krista Armstrong presents to clinic today to discuss the possibility of a hereditary predisposition to cancer, genetic testing, and to further clarify her future cancer risks, as well as potential cancer risks for family members.   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 This was treated with partial mastectomy and radiation.  She will see Dr. [name] in November to discuss anti-estrogen therapy.  Her tumor is ER+/PR+/Her2-.    CANCER HISTORY:    Breast cancer of lower-outer quadrant of left female breast ([place])   04/12/2015 Mammogram Left breast: possible asymmetry warranting further evaluation   04/26/2015 Breast US Left breast: irregular hypoechoic mass at 3:30, 7 cm from the nipple, measuring 5 x 7 x 5 mm corresponding to the palpable abnormality. No abnormal appearing axillary lymph nodes were identified.   05/03/2015 Initial Biopsy Invasive ductal carcinoma, grade 1, ER+ (95%), PR+ (1%), HER2/neu negative (ratio 1.43), Ki67 5%.    05/03/2015 Clinical Stage Stage IA: T1 N0   05/30/2015 Definitive Surgery Left lumpectomy/SLNB Dalbert Batman): Invasive ductal carcinoma, grade 1, 0.9 cm, HER2/neu negative (ratio 1.58), associated low grade DCIS and calcificiations. 1 LN removed, negative for malignancy (0/1)   05/30/2015 Pathologic Stage Stage IA: pT1b, pN0   05/30/2015 Oncotype testing Score: 20 (ROR 13%). No chemotherapy per patient.   07/07/2015 - 08/09/2015 Radiation  Therapy Adjuvant RT Pablo Ledger): Left breast 42.72 Gy over 16 fractions. Left breast boost 10 Gy over 5 fractions    Anti-estrogen oral therapy To discuss with Dr. [name] at upcoming appointment November 2016     HORMONAL RISK FACTORS:  Menarche was at age 44.  First live birth at age 66.  OCP use for approximately 10 years.  Ovaries intact: yes.  Hysterectomy: no.  Menopausal status: postmenopausal.  HRT use: 0 years. Colonoscopy: yes; normal. Mammogram within the last year: yes. Number of breast biopsies: 2. Up to date with pelvic exams:  She will be up to date in December. Any excessive radiation exposure in the past:  Breast cancer treatment.  Past Medical History  Diagnosis Date  . GERD (gastroesophageal reflux disease)   . Hypertension   . Ruptured lumbar disc   . Chronic back pain   . Colon polyps   . Hot flashes   . Breast cancer ([place]) 05/03/15    Left Breast -Invasive ductal carcinoma  . Hot flashes   . Family history of breast cancer     Past Surgical History  Procedure Laterality Date  . Back surgery  2000, 2009  . Stomach surgery  2006    tumors excised  . Back surgery    . Radioactive seed guided mastectomy with axillary sentinel lymph node biopsy Left 05/30/2015    Procedure: LEFT PARTIAL MASTECTOMY WITH RADIOACTIVE SEED LOCALIZATION, LEFT AXILLARY SENTINEL LYMPH NODE BIOPSY;  Surgeon: Fanny Skates, MD;  Location: [place];  Service: General;  Laterality: Left;  .  Ruptured lumbar disc    . Colon polyps      Social History   Social History  . Marital Status: Single    Spouse Name: N/A  . Number of Children: 3  . Years of Education: N/A   Occupational History  . [place] History Main Topics  . Smoking status: Never Smoker   . Smokeless tobacco: Never Used  . Alcohol Use: 4.2 oz/week    7 Glasses of wine per week     Comment: wine every  night   . Drug Use: No  . Sexual Activity: No   Other Topics Concern   . None   Social History Narrative     FAMILY HISTORY:  We obtained a detailed, 4-generation family history.  Significant diagnoses are listed below: Family History  Problem Relation Age of Onset  . Breast cancer Mother 18  . Stroke Father   . Heart disease Maternal Grandmother   . Colon cancer Neg Hx   . Colon polyps Neg Hx   . Diabetes Neg Hx   . Kidney disease Neg Hx   . Esophageal cancer Neg Hx   . Gallbladder disease Neg Hx   . Cervical cancer Maternal Aunt    The patient has three daughters.  Her oldest daughter, age 52, was recalled on her mammogram to get an ultrasound, which was cancelled because of the [place].  She has one sister who is cancer free.  Both parents are deceased.  Her father died from an aortic aneurysm at age 71 and her mother died from breast cancer at 26.  Her mother had three sisters and two brothers, one sister had cervical cancer.  There is no other reported family history of cancer.  Patient's maternal ancestors are of Caucasian, [place] American, American [place] descent, and paternal ancestors are of African American descent. There is reported Ashkenazi Jewish ancestry. There is no known consanguinity.  GENETIC COUNSELING ASSESSMENT: Krista Armstrong is a 64 y.o. female with a personal and family history of breast cancer and family history of Jewish ancestry which somewhat suggestive of a hereditary cancer syndrome and predisposition to cancer. We, therefore, discussed and recommended the following at today's visit.   DISCUSSION: We discussed with Krista Armstrong that the family history is not highly consistent with a familial hereditary cancer syndrome, and we feel she is at low risk to harbor a gene mutation associated with such a condition. However, her history of Jewish ancestry helps her meet the criteria for testing.  We reviewed the characteristics, features and inheritance patterns of hereditary cancer syndromes. We also discussed genetic testing, including  the appropriate family members to test, the process of testing, insurance coverage and turn-around-time for results. We discussed the implications of a negative, positive and/or variant of uncertain significant result. We recommended Krista Armstrong pursue genetic testing for the Ashkenazi Jewish Founder Mutation gene panel. The Ashkenazi Founder mutation panel performed by [facility] offers sequencing of the following three sites: BRCA1 c.68_69delAG (also known as 185delAG or 187delAGE), BRCA1 c.5266dupC (also known as 5382insC or 5385insC) and BRCA2 c.5946delT (also known as 6174delT).   Based on Krista Armstrong's personal and family history of cancer, she meets medical criteria for genetic testing. Despite that she meets criteria, she may still have an out of pocket cost. We discussed that if her out of pocket cost for testing is over $100, the laboratory will call and confirm whether she wants to proceed with testing.  If the out  of pocket cost of testing is less than $100 she will be billed by the genetic testing laboratory.   PLAN: After considering the risks, benefits, and limitations, Krista Armstrong  provided informed consent to pursue genetic testing and the blood sample was sent to [facility] for analysis of the Ashkenazi [employer]. Results should be available within approximately 7-10 days time, at which point they will be disclosed by telephone to Krista Armstrong, as will any additional recommendations warranted by these results. Krista Armstrong will receive a summary of her genetic counseling visit and a copy of her results once available. This information will also be available in Epic. We encouraged Krista Armstrong to remain in contact with cancer genetics annually so that we can continuously update the family history and inform her of any changes in cancer genetics and testing that may be of benefit for her family. Ms. Jaclyn Shaggy questions were answered to her satisfaction today. Our contact information was  provided should additional questions or concerns arise.  Lastly, we encouraged Krista Armstrong to remain in contact with cancer genetics annually so that we can continuously update the family history and inform her of any changes in cancer genetics and testing that may be of benefit for this family.   Ms.  Jaclyn Shaggy questions were answered to her satisfaction today. Our contact information was provided should additional questions or concerns arise. Thank you for the referral and allowing [place] to share in the care of your patient.   Karen P. Florene Glen, [place], [facility] Certified Genetic Counselor [email or web address] phone: [phone]  The patient was seen for a total of 60 minutes in face-to-face genetic counseling.  This patient was discussed with Drs. Magrinat, Lindi Adie and/or Burr Medico who agrees with the above.    _______________________________________________________________________ For Office Staff:  Number of people involved in session: 1 Was an Intern/ student involved with case: no

## 2015-09-04 ENCOUNTER — Other Ambulatory Visit: Payer: Self-pay | Admitting: Family Medicine

## 2015-09-06 ENCOUNTER — Encounter: Payer: Self-pay | Admitting: Genetic Counselor

## 2015-09-06 ENCOUNTER — Telehealth: Payer: Self-pay | Admitting: Genetic Counselor

## 2015-09-06 ENCOUNTER — Ambulatory Visit: Payer: Self-pay | Admitting: Genetic Counselor

## 2015-09-06 DIAGNOSIS — Z1379 Encounter for other screening for genetic and chromosomal anomalies: Secondary | ICD-10-CM

## 2015-09-06 DIAGNOSIS — C50512 Malignant neoplasm of lower-outer quadrant of left female breast: Secondary | ICD-10-CM

## 2015-09-06 DIAGNOSIS — Z803 Family history of malignant neoplasm of breast: Secondary | ICD-10-CM

## 2015-09-06 NOTE — Telephone Encounter (Signed)
LM on VM at work with [employer].  Left CB instructions.

## 2015-09-06 NOTE — Progress Notes (Signed)
HPI: [name] was previously seen in the [facility] due to a personal and family history of breast cancer and concerns regarding a hereditary predisposition to cancer. Please refer to our prior cancer genetics clinic note for more information regarding [name]'s medical, social and family histories, and our assessment and recommendations, at the time. [name] recent genetic test results were disclosed to her, as were recommendations warranted by these results. These results and recommendations are discussed in more detail below.  FAMILY HISTORY:  We obtained a detailed, 4-generation family history.  Significant diagnoses are listed below: Family History  Problem Relation Age of Onset  . Breast cancer Mother 33  . Stroke Father   . Heart disease Maternal Grandmother   . Colon cancer Neg Hx   . Colon polyps Neg Hx   . Diabetes Neg Hx   . Kidney disease Neg Hx   . Esophageal cancer Neg Hx   . Gallbladder disease Neg Hx   . Cervical cancer Maternal Aunt     The patient has three daughters. Her oldest daughter, age 72, was recalled on her mammogram to get an ultrasound, which was cancelled because of the [place]. She has one sister who is cancer free. Both parents are deceased. Her father died from an aortic aneurysm at age 45 and her mother died from breast cancer at 47. Her mother had three sisters and two brothers, one sister had cervical cancer. There is no other reported family history of cancer. Patient's maternal ancestors are of Caucasian, [place] American, American [place] descent, and paternal ancestors are of African American descent. There is reported Ashkenazi Jewish ancestry. There is no known consanguinity.  GENETIC TEST RESULTS: At the time of Ms. Gruver visit, we recommended she pursue genetic testing of the Ashkenazi Founder gene panel. The Ashkenazi Founder mutation panel performed by [facility] offers sequencing of the following three sites:  BRCA1 c.68_69delAG (also known as 185delAG or 187delAGE), BRCA1 c.5266dupC (also known as 5382insC or 5385insC) and BRCA2 c.5946delT (also known as 6174delT).   The report date is September 05, 2015.  Genetic testing was normal, and did not reveal a deleterious mutation in these genes. The test report has been scanned into EPIC and is located under the Molecular Pathology section of the Results Review tab.   We discussed with [name] that since the current genetic testing is not perfect, it is possible there may be a gene mutation in one of these genes that current testing cannot detect, but that chance is small. We also discussed, that it is possible that another gene that has not yet been discovered, or that we have not yet tested, is responsible for the cancer diagnoses in the family, and it is, therefore, important to remain in touch with cancer genetics in the future so that we can continue to offer [name] the most up to date genetic testing.   CANCER SCREENING RECOMMENDATIONS: This result is reassuring and indicates that [name] likely does not have an increased risk for a future cancer due to a mutation in one of these genes. This normal test also suggests that [name]'s cancer was most likely not due to an inherited predisposition associated with one of these genes.  Most cancers happen by chance and this negative test suggests that her cancer falls into this category.  We, therefore, recommended she continue to follow the cancer management and screening guidelines provided by her oncology and primary healthcare provider.   RECOMMENDATIONS  FOR FAMILY MEMBERS: Women in this family might be at some increased risk of developing cancer, over the general population risk, simply due to the family history of cancer. We recommended women in this family have a yearly mammogram beginning at age 93, or 39 years younger than the earliest onset of cancer, an an annual clinical breast exam, and perform  monthly breast self-exams. Women in this family should also have a gynecological exam as recommended by their primary provider. All family members should have a colonoscopy by age 1.  FOLLOW-UP: Lastly, we discussed with [name] that cancer genetics is a rapidly advancing field and it is possible that new genetic tests will be appropriate for her and/or her family members in the future. We encouraged her to remain in contact with cancer genetics on an annual basis so we can update her personal and family histories and let her know of advances in cancer genetics that may benefit this family.   Our contact number was provided. [name] questions were answered to her satisfaction, and she knows she is welcome to call us at anytime with additional questions or concerns.   Roma Kayser, MS, [facility] Certified Genetic Counselor [email or web address]

## 2015-09-07 ENCOUNTER — Encounter: Payer: Self-pay | Admitting: Nurse Practitioner

## 2015-09-07 NOTE — Progress Notes (Signed)
The Survivorship Care Plan was mailed to Krista Armstrong as she was unable to come in to the Survivorship Clinic for an in-person visit at this time. A letter was mailed to her outlining the purpose of the content of the care plan, as well as encouraging her to reach out to me with any questions or concerns.  My business card was included in the correspondence to the patient as well.  A copy of the care plan was also routed/faxed/mailed to Lucretia Kern., DO, the patient's PCP.  I will not be placing any follow-up appointments to the Survivorship Clinic for Krista Armstrong, but I am happy to see her at any time in the future for any survivorship concerns that may arise. Thank you for allowing me to participate in her care!  Kenn File, [place] [phone]

## 2015-09-08 ENCOUNTER — Telehealth: Payer: Self-pay | Admitting: Oncology

## 2015-09-08 ENCOUNTER — Ambulatory Visit
Admission: RE | Admit: 2015-09-08 | Discharge: 2015-09-08 | Disposition: A | Discharge status: Home / Self Care | Payer: Commercial Managed Care - HMO | Source: Ambulatory Visit | Attending: Radiation Oncology | Admitting: Radiation Oncology

## 2015-09-08 ENCOUNTER — Other Ambulatory Visit: Payer: Commercial Managed Care - HMO

## 2015-09-08 ENCOUNTER — Encounter: Payer: Self-pay | Admitting: Radiation Oncology

## 2015-09-08 VITALS — BP 107/71 | HR 89 | Resp 16 | Wt 162.7 lb

## 2015-09-08 DIAGNOSIS — C50512 Malignant neoplasm of lower-outer quadrant of left female breast: Secondary | ICD-10-CM

## 2015-09-08 NOTE — Telephone Encounter (Signed)
Returned patient call re confirming 11/4 appointment. Patient left message and asked to be called at work [phone] ext [phone]. Patent follow up is actually 11/11 - left message on work phone confirming date/time. Patient cell number vm full.

## 2015-09-08 NOTE — Progress Notes (Signed)
Weight and vitals stable. Reports frequent sharp shooting pains continue in her left breast. She reports her left breast is very sensitive so much so she continues to wear a sports bra. Denies nipple discharge or palpating any breast abnormalities. Reports the skin within her treatment field remains hyperpigmented and dry despite continuing to use radiaplex daily. Reports she continues to fight fatigue. Reports difficulty sleeping continues despite taking xanax. Reports xanax seems less effective than before.  BP 107/71 mmHg  Pulse 89  Resp 16  Wt 162 lb 11.2 oz (73.8 kg)  SpO2 100% Wt Readings from Last 3 Encounters:  09/08/15 162 lb 11.2 oz (73.8 kg)  07/19/15 160 lb 8 oz (72.802 kg)  07/12/15 161 lb 8 oz (73.256 kg)

## 2015-09-08 NOTE — Progress Notes (Signed)
Department of Radiation Oncology  Phone:  [phone] Fax:        [phone]   Name: Krista Armstrong MRN: [id number]  DOB: 08/01/1951  Date: 09/08/2015  Follow Up Visit Note  Diagnosis: Breast cancer of lower-outer quadrant of left female breast [facility])   Staging form: Breast, AJCC 7th Edition     Clinical stage from 05/11/2015: Stage IA (T1b, N0, M0) - Unsigned     Pathologic stage from 05/30/2015: Stage IA (T1b, N0, cM0) - Unsigned   Summary and Interval since last radiation: The radiation treatment dates extended from 07/07/2015-08/09/2015. The site and dose included the left breast at 42.72 Gy at 2.67 Gy per fraction x 16 fractions and the left breast boost at 10 Gy at 2 Gy per fraction x 5 fractions. The beams and energy used included opposed tangents with 3D conformal breath hold at 6 MV photons and enface electrons at 18 MeV.  Interval History: Krista Armstrong presents today for routine follow-up appointment. Her weight and vitals are stable. She reports frequent sharp shooting pains continue in her left breast. She reports her left breast is very sensitive so much so she continues to wear a sports bra. The patient vocalized that the skin within her treatment field remains hyperpigmented and dry despite continuing to use of Radiaplex gel daily. The patient has difficulty sleeping, even after taking xanax. She canceled her appointment for physical therapy due to severe left hand pain, which extremely concerned her. "Just this one hand was shaking uncontrollably. It lasted all day," the patient stated about this incident, but states it was not paired with a headache and only lasted one day. She still lives with her daughter and 2 and one year old grandchildren.  Physical Exam:  Filed Vitals:   09/08/15 1517  BP: 107/71  Pulse: 89  Resp: 16  Weight: 162 lb 11.2 oz (73.8 kg)  SpO2: 100%  The patient is alert and oriented x3. There is no significant changes to the status of overall health to  be noted at this time. Breast: Mildly hyperpigmented left breast  IMPRESSION: Krista Armstrong is a 64 y.o. female presenting to clinic in regards to her breast cancer of lower-outer quadrant of left female breast ([place]). She is recovering from the effects of radiation treatments and understands the importance of completing annual mammograms. She additionally understands that if concerning symptoms of left hand pain and shaking are to return, she is to contact and notify her doctor. The patient understands that she can access her appointments and medical records via [place].   PLAN: She is doing well. We discussed the need for follow up every 4-6 months which she has scheduled.  We discussed the need for yearly mammograms which she can schedule with her OBGYN or with medical oncology. We discussed the need for sun protection in the treated area. I will follow up with her on an as needed basis. Healthy methods of management in regards to reported symptoms were reviewed in detail. She is to administer vitamin E lotion as needed instead of radiaplex gel. Anti-inflammatory medicaitons such as Aleve or Advil is recommended for reported symptoms of pain caused by inflammation. She is to attend her appointment with Dr. [name], MD on November 11th of 2016 to discuss hormone therapy. All vocalized questions and concerns have been addressed. If the patient develops any further questions or concerns in regards to her treatment and recovery, she has been encouraged to contact Dr. Pablo Ledger, MD.   This document serves as  a record of services personally performed by Thea Silversmith , MD. It was created on her behalf by Lenn Cal, a trained medical scribe. The creation of this record is based on the scribe's personal observations and the provider's statements to them. This document has been checked and approved by the attending provider.   ------------------------------------------------  Thea Silversmith, MD

## 2015-09-15 ENCOUNTER — Ambulatory Visit: Payer: Commercial Managed Care - HMO | Admitting: Oncology

## 2015-09-26 ENCOUNTER — Other Ambulatory Visit: Payer: Self-pay | Admitting: Family Medicine

## 2015-09-29 ENCOUNTER — Other Ambulatory Visit: Payer: Self-pay | Admitting: Family Medicine

## 2015-09-30 ENCOUNTER — Telehealth: Payer: Self-pay | Admitting: Oncology

## 2015-09-30 ENCOUNTER — Ambulatory Visit (HOSPITAL_BASED_OUTPATIENT_CLINIC_OR_DEPARTMENT_OTHER): Payer: Commercial Managed Care - HMO | Admitting: Oncology

## 2015-09-30 ENCOUNTER — Other Ambulatory Visit: Payer: Self-pay | Admitting: *Deleted

## 2015-09-30 VITALS — BP 132/80 | HR 76 | Temp 98.1°F | Resp 18 | Ht 66.0 in | Wt 163.5 lb

## 2015-09-30 DIAGNOSIS — C50512 Malignant neoplasm of lower-outer quadrant of left female breast: Secondary | ICD-10-CM | POA: Diagnosis not present

## 2015-09-30 MED ORDER — TAMOXIFEN CITRATE 20 MG PO TABS: 20.0000 mg | ORAL_TABLET | Freq: Every day | ORAL | Status: AC

## 2015-09-30 MED ORDER — GABAPENTIN 300 MG PO CAPS: 300.0000 mg | ORAL_CAPSULE | Freq: Every day | ORAL | Status: DC

## 2015-09-30 MED ORDER — LOSARTAN POTASSIUM 50 MG PO TABS: 50.0000 mg | ORAL_TABLET | Freq: Every day | ORAL | Status: DC

## 2015-09-30 MED ORDER — OMEPRAZOLE 40 MG PO CPDR: 40.0000 mg | DELAYED_RELEASE_CAPSULE | Freq: Every day | ORAL | Status: DC

## 2015-09-30 MED ORDER — AMLODIPINE BESYLATE 5 MG PO TABS: 5.0000 mg | ORAL_TABLET | Freq: Every day | ORAL | Status: DC

## 2015-09-30 MED ORDER — VENLAFAXINE HCL ER 37.5 MG PO CP24: 37.5000 mg | ORAL_CAPSULE | Freq: Every day | ORAL | Status: DC

## 2015-09-30 NOTE — Progress Notes (Signed)
[facility]  [phone] [phone]     ID: [name] DOB: Jun 22, 1951  MR#: [id number]  HDQ#:[id number]  Patient Care Team: Lucretia Kern, DO as PCP - General (Family Medicine) Fanny Skates, MD as Consulting Physician (General Surgery) Chauncey Cruel, MD as Consulting Physician (Oncology) Thea Silversmith, MD as Consulting Physician (Radiation Oncology) Rockwell Germany, RN as Registered Nurse Mauro Kaufmann, RN as Registered Nurse Holley Bouche, NP as Nurse Practitioner (Nurse Practitioner) Sylvan Cheese, NP as Nurse Practitioner (Nurse Practitioner) PCP: Lucretia Kern., DO OTHER MD: Owens Loffler MD  CHIEF COMPLAINT: estrogen receptor positive breast cancer  CURRENT TREATMENT:   BREAST CANCER HISTORY: From the original intake note:  Maxi (pronounced "ROZ-mah") had routine screening mammography at the breast Center 04/12/2015, showing a possible asymmetry in the left breast. Bilateral diagnostic mammography with tomosynthesis the left breast ultrasonography 04/26/2015, showed the breast density to be category B. In the left breast there was a persistent area of focal asymmetry in the lower outer quadrant. On physical exam there was a 1 cm firm nodular area at 3:30 o'clock 7 cm from the nipple. Ultrasound confirmed an irregular hypoechoic mass measuring 7 mm corresponding to the palpable abnormality. There were no abnormal appearing axillary lymph nodes.  Biopsy of the left breast mass in question 05/03/2015 showed ([id number]) and invasive ductal carcinoma, grade 1, estrogen receptor 95% positive, progesterone receptor 1% positive, both with strong staining intensity, with an MIB-1 of 5%, and no HER-2 amplification, the signals ratio being 1.43 and the number per cell 2.50.  The patient's subsequent history is as detailed below.  INTERVAL HISTORY: Phuong returns today for follow-up of her breast cancer. Since the last time I saw  her she has completed her radiation treatments. She generally did well with those. She had no significant fatigue and continued to work right through therapy. Her skin held up well, although the hyperpigmentation concerns her.  REVIEW OF SYSTEMS: [name] had some shooting/stabbing pains in the surgical breast after surgery, but these have become much less frequent. She is having significant problems with insomnia, partly due to nighttime hot flashes, and partly due to anxiety. When she is not dizzy was something she can only think of the cancer coming back. She is able to focus at work. It helps when she is doing something with her children and in general when she is distracted. Otherwise she feels depressed and even more than that anxious. She is not exercising regularly. A detailed review of systems was otherwise stable.  PAST MEDICAL HISTORY: Past Medical History  Diagnosis Date  . GERD (gastroesophageal reflux disease)   . Hypertension   . Ruptured lumbar disc   . Chronic back pain   . Colon polyps   . Hot flashes   . Breast cancer (Krista Armstrong) 05/03/15    Left Breast -Invasive ductal carcinoma  . Hot flashes   . Family history of breast cancer   . Radiation 07/07/15-08/09/15    Left breast    PAST SURGICAL HISTORY: Past Surgical History  Procedure Laterality Date  . Back surgery  2000, 2009  . Stomach surgery  2006    tumors excised  . Back surgery    . Radioactive seed guided mastectomy with axillary sentinel lymph node biopsy Left 05/30/2015    Procedure: LEFT PARTIAL MASTECTOMY WITH RADIOACTIVE SEED LOCALIZATION, LEFT AXILLARY SENTINEL LYMPH NODE BIOPSY;  Surgeon: Fanny Skates, MD;  Location: [facility];  Service: General;  Laterality: Left;  . Ruptured lumbar disc    . Colon polyps      FAMILY HISTORY Family History  Problem Relation Age of Onset  . Breast cancer Mother 93  . Stroke Father   . Heart disease Maternal Grandmother   . Colon cancer Neg Hx   . Colon  polyps Neg Hx   . Diabetes Neg Hx   . Kidney disease Neg Hx   . Esophageal cancer Neg Hx   . Gallbladder disease Neg Hx   . Cervical cancer Maternal Aunt   the patient's father died at the age of 50 from a ruptured aortic aneurysm. The patient's mother was diagnosed with breast cancer at the age of 47 and died within a year from that problem. The patient had no brothers, one sister. There is no other history of breast or ovarian cancer in the family.  GYNECOLOGIC HISTORY:  No LMP recorded. Patient is postmenopausal. Menarche age 64, first live birth age 64. The patient is GX P3. She stopped having periods in the late 1990s. She did not take hormone replacement. She did take oral contraceptives remotely for approximately 10 years with no complications.  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. She is single. At home she lives with her daughter [name], who is currently unemployed but generally works as an Designer, multimedia out of her home. The patient's 2 other children are [name], who lives in [facility] and works as a better urinary and; and Transport planner, who lives in [place] and works as an Web designer. The patient has 4 grandchildren. She attends [employer].     ADVANCED DIRECTIVES: not in place;  At the initial clinic visit 09/30/2015 the patient was given the appropriate forms to complete and notarize at her discretion.  HEALTH MAINTENANCE: Social History  Substance Use Topics  . Smoking status: Never Smoker   . Smokeless tobacco: Never Used  . Alcohol Use: 4.2 oz/week    7 Glasses of wine per week     Comment: wine every  night      Colonoscopy:03/16/2015  PAP:  Bone density:never  Lipid panel:  Allergies  Allergen Reactions  . Vicodin [Hydrocodone-Acetaminophen] Other (See Comments)    Massive headache    Current Outpatient Prescriptions  Medication Sig Dispense  Refill  . acetaminophen-codeine (TYLENOL #3) 300-30 MG per tablet Take 1 tablet by mouth. 300-29m tab    . ALPRAZolam (XANAX) 0.25 MG tablet Take 1 tablet (0.25 mg total) by mouth at bedtime as needed for anxiety. 30 tablet 0  . amLODipine (NORVASC) 5 MG tablet Take 1 tablet (5 mg total) by mouth daily. 90 tablet 0  . Biotin 1 MG CAPS Take by mouth.    . losartan (COZAAR) 50 MG tablet Take 1 tablet (50 mg total) by mouth daily. 90 tablet 0  . omeprazole (PRILOSEC) 40 MG capsule Take 1 capsule (40 mg total) by mouth daily. 90 capsule 0  . oxyCODONE-acetaminophen (PERCOCET/ROXICET) 5-325 MG per tablet     . VITAMIN E PO Take by mouth.     No current facility-administered medications for this visit.    OBJECTIVE: middle-aged African-American woman who appears younger than stated age F81Vitals:   09/30/15 1343  BP: 132/80  Pulse: 76  Temp: 98.1 F (36.7 C)  Resp: 18     Body mass index is 26.4 kg/(m^2).    ECOG FS:1 - Symptomatic  but completely ambulatory  Sclerae unicteric, EOMs intact Oropharynx clear, dentition in good repair No cervical or supraclavicular adenopathy Lungs no rales or rhonchi Heart regular rate and rhythm Abd soft, nontender, positive bowel sounds MSK no focal spinal tenderness, no upper extremity lymphedema Neuro: nonfocal, well oriented, appropriate affect Breasts: The right breast is unremarkable to the left breast is status post lumpectomy and radiation. There is no desquamation. The hyper pigmentation is moderate. The cosmetic result overall is very good. There is no evidence of local recurrence. The left axilla is benign.   LAB RESULTS:  CMP     Component Value Date/Time   NA 141 05/11/2015 0756   NA 138 10/19/2014 1205   K 4.1 05/11/2015 0756   K 4.5 10/19/2014 1205   CL 103 10/19/2014 1205   CO2 28 05/11/2015 0756   CO2 27 10/19/2014 1205   GLUCOSE 110 05/11/2015 0756   GLUCOSE 83 10/19/2014 1205   BUN 11.7 05/11/2015 0756   BUN 13  10/19/2014 1205   CREATININE 0.8 05/11/2015 0756   CREATININE 0.7 10/19/2014 1205   CALCIUM 9.4 05/11/2015 0756   CALCIUM 9.3 10/19/2014 1205   PROT 7.6 05/11/2015 0756   ALBUMIN 4.0 05/11/2015 0756   AST 16 05/11/2015 0756   ALT 13 05/11/2015 0756   ALKPHOS 74 05/11/2015 0756   BILITOT 0.53 05/11/2015 0756    INo results found for: SPEP, UPEP  Lab Results  Component Value Date   WBC 5.2 05/11/2015   NEUTROABS 2.9 05/11/2015   HGB 12.1 05/11/2015   HCT 36.8 05/11/2015   MCV 85.7 05/11/2015   PLT 264 05/11/2015      Chemistry      Component Value Date/Time   NA 141 05/11/2015 0756   NA 138 10/19/2014 1205   K 4.1 05/11/2015 0756   K 4.5 10/19/2014 1205   CL 103 10/19/2014 1205   CO2 28 05/11/2015 0756   CO2 27 10/19/2014 1205   BUN 11.7 05/11/2015 0756   BUN 13 10/19/2014 1205   CREATININE 0.8 05/11/2015 0756   CREATININE 0.7 10/19/2014 1205      Component Value Date/Time   CALCIUM 9.4 05/11/2015 0756   CALCIUM 9.3 10/19/2014 1205   ALKPHOS 74 05/11/2015 0756   AST 16 05/11/2015 0756   ALT 13 05/11/2015 0756   BILITOT 0.53 05/11/2015 0756       No results found for: LABCA2  No components found for: [id number]  No results for input(s): INR in the last 168 hours.  Urinalysis No results found for: COLORURINE, APPEARANCEUR, LABSPEC, PHURINE, GLUCOSEU, HGBUR, BILIRUBINUR, KETONESUR, PROTEINUR, UROBILINOGEN, NITRITE, LEUKOCYTESUR  STUDIES: No results found.  ASSESSMENT: 64 y.o. [place] woman status post left breast biopsy 05/03/2015 for a clinical T1b N0, stage IA invasive ductal carcinoma, grade 1, estrogen receptor strongly positive, progesterone receptor minimally positive, with an MIB-1 of 5% and no HER-2 amplification.  (1) left lumpectomy and sentinel lymph node sampling 05/30/2015 showed a pT1b pN0, stage IA invasive ductal carcinoma, grade 1, with close but negative margins, and repeat HER-2 again negative  (2) Oncotype score of 20 (intermediate  risk) predicts a 10 year risk of recurrence outside the breast of 13% if the patient's only systemic therapy is tamoxifen for 5 years.   (a) the patient opted against chemotherapy given the very marginal benefits in her situation  (3) adjuvant radiation 07/07/2015-08/09/2015:  Left breast/ 42.72 Gy at 2.67 Gy per fraction x 16 fractions.  Left breast boost/ 10 Gy  at 2 Gy per fraction x 5 fractions  (4) to start tamoxifen 11/20/2015  (5) genetics testing 09/05/2015 through the [place] gene panel performed by [facility] offers found no deleterious mutations in BRCA1 c.68_69delAG (also known as 185delAG or 187delAGE), BRCA1 c.5266dupC (also known as 5382insC or 5385insC) and BRCA2 c.5946delT (also known as 6174delT).   PLAN: Yazhini has completed her local treatment for breast cancer. She is now ready to start systemic therapy. She understands the need for systemic therapy in that she is at risk of harboring occult disease outside the breast and axilla. Systemic treatment goes "all over the system" and is essentially will cut in half her risk of breast cancer.  We discussed the difference between anastrozole and tamoxifen and she has a good understanding of the toxicities, side effects and complications of these agents. I gave her that information in writing. After much discussion we decided we would start tamoxifen.  However before starting tamoxifen I think she needs to feel a little bit better. She remains very anxious. She has some symptoms suggestive of mild posttraumatic stress. She is not sleeping. She is having significant hot flashes.  We're going to start with gabapentin, which she will take at bedtime. If she still has trouble sleeping she can add Tylenol PM. After she has been on about 3 weeks, late this month, she will call us and let us know how she is doing. If she still having significant hot flashes at night we will double the bedtime dose.  In mid-December she will start  venlafaxine. Regarding to start at a very low dose, 37.5 mg daily. She will let us know after 2 weeks whether that is working or not and very likely we will have to increase the dose to 75 mg daily.  I think with his medications on board she will be ready to start tamoxifen, and the target date for that is 11/20/2015. I am going to see her again in March and if she is tolerating tamoxifen well the plan will be to continue that a minimum of 2 years before considering switching. If she does not tolerate tamoxifen well we will switch to anastrozole at that time.  I also urged her to start an exercise program, at a minimum walking 30 minutes 5 days a week. Finally I urged her to call and sign up for the next finding your new normal program, which I believe will start in February  I think these suggestions will be very helpful to Ulyssa. She is going to call us to let us know how things are going instead of coming back for repeat visits just to report. Of course she will let us know if side effects or complications developed. The overall plan is to continue anti-estrogens for a minimum of 5 years.  Chauncey Cruel, MD   09/30/2015 1:46 PM Medical Oncology and Hematology [facility] [address] [place], [place] [address] Tel. [phone]    Fax. [phone]

## 2015-09-30 NOTE — Telephone Encounter (Signed)
Appointments made and avs printed for patient °

## 2015-09-30 NOTE — Telephone Encounter (Signed)
Rx done. 

## 2015-09-30 NOTE — Telephone Encounter (Signed)
Ok to refill any chronic meds for 90 days if has follow up. Thanks.

## 2015-09-30 NOTE — Addendum Note (Signed)
Addended by: Agnes Lawrence on: 09/30/2015 11:27 AM   Modules accepted: Orders

## 2015-09-30 NOTE — Telephone Encounter (Signed)
Rxs re-sent for #90.

## 2015-09-30 NOTE — Telephone Encounter (Signed)
Patient went to pick up her Rx's at [facility] and only one had been called in. (was not sure which one it was) She is requesting a 90 day supply of norvasc, losartan and prilosec be called in so she can pick them all up together. Has a CPE scheduled for December.

## 2015-10-04 ENCOUNTER — Other Ambulatory Visit: Payer: Commercial Managed Care - HMO

## 2015-10-12 ENCOUNTER — Ambulatory Visit: Payer: Commercial Managed Care - HMO | Admitting: Oncology

## 2015-10-22 ENCOUNTER — Encounter (HOSPITAL_COMMUNITY): Payer: Self-pay

## 2015-11-08 ENCOUNTER — Encounter: Payer: Commercial Managed Care - HMO | Admitting: Family Medicine

## 2015-11-17 ENCOUNTER — Ambulatory Visit (INDEPENDENT_AMBULATORY_CARE_PROVIDER_SITE_OTHER): Payer: Commercial Managed Care - HMO | Admitting: Family Medicine

## 2015-11-17 ENCOUNTER — Encounter: Payer: Self-pay | Admitting: Family Medicine

## 2015-11-17 VITALS — BP 100/70 | HR 82 | Temp 98.6°F | Ht 67.0 in | Wt 168.9 lb

## 2015-11-17 DIAGNOSIS — Z23 Encounter for immunization: Secondary | ICD-10-CM

## 2015-11-17 DIAGNOSIS — F411 Generalized anxiety disorder: Secondary | ICD-10-CM | POA: Diagnosis not present

## 2015-11-17 DIAGNOSIS — K219 Gastro-esophageal reflux disease without esophagitis: Secondary | ICD-10-CM

## 2015-11-17 DIAGNOSIS — Z0000 Encounter for general adult medical examination without abnormal findings: Secondary | ICD-10-CM | POA: Diagnosis not present

## 2015-11-17 DIAGNOSIS — C50512 Malignant neoplasm of lower-outer quadrant of left female breast: Secondary | ICD-10-CM

## 2015-11-17 LAB — COMPREHENSIVE METABOLIC PANEL
ALBUMIN: 4 g/dL (ref 3.5–5.2)
ALT: 8 U/L (ref 0–35)
AST: 14 U/L (ref 0–37)
Alkaline Phosphatase: 63 U/L (ref 39–117)
BUN: 12 mg/dL (ref 6–23)
CO2: 29 mEq/L (ref 19–32)
Calcium: 9.3 mg/dL (ref 8.4–10.5)
Chloride: 106 mEq/L (ref 96–112)
Creatinine, Ser: 0.65 mg/dL (ref 0.40–1.20)
GFR: 118.1 mL/min (ref >60.00)
Glucose, Bld: 89 mg/dL (ref 70–99)
Potassium: 4.2 mEq/L (ref 3.5–5.1)
SODIUM: 142 meq/L (ref 135–145)
Total Bilirubin: 0.8 mg/dL (ref 0.2–1.2)
Total Protein: 7.5 g/dL (ref 6.0–8.3)

## 2015-11-17 LAB — CBC WITH DIFFERENTIAL/PLATELET
BASOS PCT: 0.6 % (ref 0.0–3.0)
Basophils Absolute: 0 10*3/uL (ref 0.0–0.1)
Eosinophils Absolute: 0.1 10*3/uL (ref 0.0–0.7)
Eosinophils Relative: 1.4 % (ref 0.0–5.0)
HEMATOCRIT: 36.4 % (ref 36.0–46.0)
Hemoglobin: 11.9 g/dL — ABNORMAL LOW (ref 12.0–15.0)
Lymphocytes Relative: 25 % (ref 12.0–46.0)
Lymphs Abs: 1.5 10*3/uL (ref 0.7–4.0)
MCHC: 32.6 g/dL (ref 30.0–36.0)
MCV: 86.6 fl (ref 78.0–100.0)
MONO ABS: 0.4 10*3/uL (ref 0.1–1.0)
Monocytes Relative: 5.9 % (ref 3.0–12.0)
Neutro Abs: 4.1 10*3/uL (ref 1.4–7.7)
Neutrophils Relative %: 67.1 % (ref 43.0–77.0)
Platelets: 258 10*3/uL (ref 150.0–400.0)
RBC: 4.21 Mil/uL (ref 3.87–5.11)
RDW: 14.4 % (ref 11.5–15.5)
WBC: 6.1 10*3/uL (ref 4.0–10.5)

## 2015-11-17 LAB — LIPID PANEL
CHOL/HDL RATIO: 2
Cholesterol: 130 mg/dL (ref 0–200)
HDL: 56.7 mg/dL (ref >39.00)
LDL Cholesterol: 55 mg/dL (ref 0–99)
NonHDL: 73.79
TRIGLYCERIDES: 95 mg/dL (ref 0.0–149.0)
VLDL: 19 mg/dL (ref 0.0–40.0)

## 2015-11-17 LAB — HEMOGLOBIN A1C: HEMOGLOBIN A1C: 5.8 % (ref 4.6–6.5)

## 2015-11-17 MED ORDER — AMLODIPINE BESYLATE 5 MG PO TABS: 5.0000 mg | ORAL_TABLET | Freq: Every day | ORAL | Status: DC

## 2015-11-17 MED ORDER — ESOMEPRAZOLE MAGNESIUM 40 MG PO CPDR: 40.0000 mg | DELAYED_RELEASE_CAPSULE | Freq: Every day | ORAL | Status: AC

## 2015-11-17 MED ORDER — LOSARTAN POTASSIUM 50 MG PO TABS: 50.0000 mg | ORAL_TABLET | Freq: Every day | ORAL | Status: DC

## 2015-11-17 NOTE — Patient Instructions (Signed)
BEFORE YOU LEAVE: -flu vaccine  -labs -follow up in 3 months  Please get yearly gynecological exam   -We have ordered labs or studies at this visit. It can take up to 1-2 weeks for results and processing. We will contact you with instructions IF your results are abnormal. Normal results will be released to your [facility]. If you have not heard from [place] or can not find your results in [facility] in 2 weeks please contact our office.  We recommend the following healthy lifestyle measures: - eat a healthy whole foods diet consisting of regular small meals composed of vegetables, fruits, beans, nuts, seeds, healthy meats such as white chicken and fish and whole grains.  - avoid sweets, white starchy foods, fried foods, fast food, processed foods, sodas, red meet and other fattening foods.  - get a least 150-300 minutes of aerobic exercise per week.   Consider counseling for anxiety

## 2015-11-17 NOTE — Progress Notes (Signed)
Pre visit review using our clinic review tool, if applicable. No additional management support is needed unless otherwise documented below in the visit note. 

## 2015-11-17 NOTE — Progress Notes (Signed)
HPI:  Here for CPE:  -Concerns and/or follow up today:   HTN: -meds: amlodipine 5, losartan 50mg  -stable, doing well, needs refills  GERD: -meds omeprazole 40 mg -chronic and severe if stops medicaiton, feels nexium works better  -wants to try to switch to nexium  Breast ca: -unfortunate dx since my last visit with her - dx on screening mammo 03/2015 -s/p radiation and lumpectomy, per onc notes starting tamoxifen in January but she started it 1 month ago -onc is treating her for anxiety and hot flashes with gabapentin and venlafaxine - reports  Is doing much better exercise  -Taking folic acid, vitamin D or calcium: no  -Diabetes and Dyslipidemia Screening: FASTING today for almost 24 hours without any fluids  -Hx of HTN: no  -Vaccines: wants flu vaccine today  -pap history: UTD and normal, plans to establish with gyn for pelvic and dexa  -sexual activity: yes, female partner, no new partners  -wants STI testing (Hep C if born 61-65): no  -FH breast, colon or ovarian ca: see FH Last mammogram: done Last colon cancer screening: UTD  DEXA (>/= 65): declined for now, may do with gyn  -Alcohol, Tobacco, drug use: see social history  Review of Systems - no fevers, unintentional weight loss, vision loss, hearing loss, chest pain, sob, hemoptysis, melena, hematochezia, hematuria, genital discharge, changing or concerning skin lesions, bleeding, bruising, loc, thoughts of self harm or SI  Past Medical History  Diagnosis Date  . GERD (gastroesophageal reflux disease)   . Hypertension   . Ruptured lumbar disc   . Chronic back pain   . Colon polyps   . Hot flashes   . Breast cancer ([place]) 05/03/15    Left Breast -Invasive ductal carcinoma  . Hot flashes   . Family history of breast cancer   . Radiation 07/07/15-08/09/15    Left breast    Past Surgical History  Procedure Laterality Date  . Back surgery  2000, 2009  . Stomach surgery  2006    tumors excised  . Back  surgery    . Radioactive seed guided mastectomy with axillary sentinel lymph node biopsy Left 05/30/2015    Procedure: LEFT PARTIAL MASTECTOMY WITH RADIOACTIVE SEED LOCALIZATION, LEFT AXILLARY SENTINEL LYMPH NODE BIOPSY;  Surgeon: Fanny Skates, MD;  Location: [facility];  Service: General;  Laterality: Left;  . Ruptured lumbar disc    . Colon polyps      Family History  Problem Relation Age of Onset  . Breast cancer Mother 22  . Stroke Father   . Heart disease Maternal Grandmother   . Colon cancer Neg Hx   . Colon polyps Neg Hx   . Diabetes Neg Hx   . Kidney disease Neg Hx   . Esophageal cancer Neg Hx   . Gallbladder disease Neg Hx   . Cervical cancer Maternal Aunt     Social History   Social History  . Marital Status: Single    Spouse Name: N/A  . Number of Children: 3  . Years of Education: N/A   Occupational History  . [place] History Main Topics  . Smoking status: Never Smoker   . Smokeless tobacco: Never Used  . Alcohol Use: 4.2 oz/week    7 Glasses of wine per week     Comment: wine every  night   . Drug Use: No  . Sexual Activity: No   Other Topics Concern  . None   Social  History Narrative     Current outpatient prescriptions:  .  amLODipine (NORVASC) 5 MG tablet, Take 1 tablet (5 mg total) by mouth daily., Disp: 90 tablet, Rfl: 3 .  Biotin 1 MG CAPS, Take by mouth., Disp: , Rfl:  .  gabapentin (NEURONTIN) 300 MG capsule, Take 1 capsule (300 mg total) by mouth at bedtime., Disp: 90 capsule, Rfl: 4 .  losartan (COZAAR) 50 MG tablet, Take 1 tablet (50 mg total) by mouth daily., Disp: 90 tablet, Rfl: 3 .  tamoxifen (NOLVADEX) 10 MG tablet, Take 10 mg by mouth daily., Disp: , Rfl:  .  venlafaxine XR (EFFEXOR XR) 37.5 MG 24 hr capsule, Take 1 capsule (37.5 mg total) by mouth daily with breakfast., Disp: 90 capsule, Rfl: 4 .  VITAMIN E PO, Take by mouth., Disp: , Rfl:  .  esomeprazole (NEXIUM) 40 MG capsule, Take 1 capsule (40  mg total) by mouth daily., Disp: 90 capsule, Rfl: 1  EXAM:  Filed Vitals:   11/17/15 1433  BP: 100/70  Pulse: 82  Temp: 98.6 F (37 C)    GENERAL: vitals reviewed and listed below, alert, oriented, appears well hydrated and in no acute distress  HEENT: head atraumatic, PERRLA, normal appearance of eyes, ears, nose and mouth. moist mucus membranes.  NECK: supple, no masses or lymphadenopathy  LUNGS: clear to auscultation bilaterally, no rales, rhonchi or wheeze  CV: HRRR, no peripheral edema or cyanosis, normal pedal pulses  BREAST: declined  ABDOMEN: bowel sounds normal, soft, non tender to palpation, no masses, no rebound or guarding  GU: declined  SKIN: no rash or abnormal lesions  MS: normal gait, moves all extremities normally  NEURO: CN II-XII grossly intact, normal muscle strength and sensation to light touch on extremities  PSYCH: normal affect, pleasant and cooperative  ASSESSMENT AND PLAN:  Discussed the following assessment and plan:  Visit for preventive health examination - Plan: Lipid Panel, Hemoglobin A1c, Hep C Antibody  Breast cancer of lower-outer quadrant of left female breast (HCC) - Plan: CMP, CBC with Differential  Gastroesophageal reflux disease without esophagitis  GAD (generalized anxiety disorder)  -change to nexium - rx sent  -she plans to see gyn for yearly pelvic and consider dexa - advise we could order this or she may do with gyn  -fasting labs today   -Discussed and advised all [place] preventive services health task force level A and B recommendations for age, sex and risks.  -Advised at least 150 minutes of exercise per week and a healthy diet low in saturated fats and sweets and consisting of fresh fruits and vegetables, lean meats such as fish and white chicken and whole grains.  -labs, studies and vaccines per orders this encounter  Orders Placed This Encounter  Procedures  . Lipid Panel  . Hemoglobin A1c  . Hep C Antibody   . CMP  . CBC with Differential    Patient advised to return to clinic immediately if symptoms worsen or persist or new concerns.  Patient Instructions  BEFORE YOU LEAVE: -flu vaccine  -labs -follow up in 3 months  Please get yearly gynecological exam   -We have ordered labs or studies at this visit. It can take up to 1-2 weeks for results and processing. We will contact you with instructions IF your results are abnormal. Normal results will be released to your [facility]. If you have not heard from [place] or can not find your results in [facility] in 2 weeks please contact our office.  We recommend the following healthy lifestyle measures: - eat a healthy whole foods diet consisting of regular small meals composed of vegetables, fruits, beans, nuts, seeds, healthy meats such as white chicken and fish and whole grains.  - avoid sweets, white starchy foods, fried foods, fast food, processed foods, sodas, red meet and other fattening foods.  - get a least 150-300 minutes of aerobic exercise per week.   Consider counseling for anxiety         No Follow-up on file.  Colin Benton R.

## 2015-11-18 LAB — HEPATITIS C ANTIBODY: HCV Ab: NEGATIVE

## 2015-12-12 ENCOUNTER — Other Ambulatory Visit (HOSPITAL_COMMUNITY)
Admission: RE | Admit: 2015-12-12 | Discharge: 2015-12-12 | Disposition: A | Discharge status: Home / Self Care | Payer: Commercial Managed Care - HMO | Source: Ambulatory Visit | Attending: Family Medicine | Admitting: Family Medicine

## 2015-12-12 ENCOUNTER — Encounter: Payer: Self-pay | Admitting: Family Medicine

## 2015-12-12 ENCOUNTER — Ambulatory Visit (INDEPENDENT_AMBULATORY_CARE_PROVIDER_SITE_OTHER): Payer: Commercial Managed Care - HMO | Admitting: Family Medicine

## 2015-12-12 VITALS — BP 118/70 | HR 75 | Temp 98.3°F | Ht 67.0 in | Wt 168.8 lb

## 2015-12-12 DIAGNOSIS — Z124 Encounter for screening for malignant neoplasm of cervix: Secondary | ICD-10-CM

## 2015-12-12 DIAGNOSIS — N898 Other specified noninflammatory disorders of vagina: Secondary | ICD-10-CM

## 2015-12-12 DIAGNOSIS — Z113 Encounter for screening for infections with a predominantly sexual mode of transmission: Secondary | ICD-10-CM | POA: Diagnosis present

## 2015-12-12 DIAGNOSIS — N760 Acute vaginitis: Secondary | ICD-10-CM | POA: Diagnosis not present

## 2015-12-12 DIAGNOSIS — Z01419 Encounter for gynecological examination (general) (routine) without abnormal findings: Secondary | ICD-10-CM | POA: Insufficient documentation

## 2015-12-12 DIAGNOSIS — Z1151 Encounter for screening for human papillomavirus (HPV): Secondary | ICD-10-CM | POA: Insufficient documentation

## 2015-12-12 NOTE — Progress Notes (Signed)
HPI:  Acute visit for:  ? Yeast infection: -started 2 weeks -mild vulvovaginal pruritis, mild discharge -no concern for STI, not sexually active, no recent abx -almost due for pap so opted to do today -denies: fevers, malaise, abd pain, dysuria, vaginal bleeding  ROS: See pertinent positives and negatives per HPI.  Past Medical History  Diagnosis Date  . GERD (gastroesophageal reflux disease)   . Hypertension   . Ruptured lumbar disc   . Chronic back pain   . Colon polyps   . Hot flashes   . Breast cancer ([place]) 05/03/15    Left Breast -Invasive ductal carcinoma  . Hot flashes   . Family history of breast cancer   . Radiation 07/07/15-08/09/15    Left breast    Past Surgical History  Procedure Laterality Date  . Back surgery  2000, 2009  . Stomach surgery  2006    tumors excised  . Back surgery    . Radioactive seed guided mastectomy with axillary sentinel lymph node biopsy Left 05/30/2015    Procedure: LEFT PARTIAL MASTECTOMY WITH RADIOACTIVE SEED LOCALIZATION, LEFT AXILLARY SENTINEL LYMPH NODE BIOPSY;  Surgeon: Fanny Skates, MD;  Location: [facility];  Service: General;  Laterality: Left;  . Ruptured lumbar disc    . Colon polyps      Family History  Problem Relation Age of Onset  . Breast cancer Mother 54  . Stroke Father   . Heart disease Maternal Grandmother   . Colon cancer Neg Hx   . Colon polyps Neg Hx   . Diabetes Neg Hx   . Kidney disease Neg Hx   . Esophageal cancer Neg Hx   . Gallbladder disease Neg Hx   . Cervical cancer Maternal Aunt     Social History   Social History  . Marital Status: Single    Spouse Name: N/A  . Number of Children: 3  . Years of Education: N/A   Occupational History  . [place] History Main Topics  . Smoking status: Never Smoker   . Smokeless tobacco: Never Used  . Alcohol Use: 4.2 oz/week    7 Glasses of wine per week     Comment: wine every  night   . Drug Use: No  . Sexual  Activity: No   Other Topics Concern  . None   Social History Narrative     Current outpatient prescriptions:  .  amLODipine (NORVASC) 5 MG tablet, Take 1 tablet (5 mg total) by mouth daily., Disp: 90 tablet, Rfl: 3 .  Biotin 1 MG CAPS, Take by mouth., Disp: , Rfl:  .  esomeprazole (NEXIUM) 40 MG capsule, Take 1 capsule (40 mg total) by mouth daily., Disp: 90 capsule, Rfl: 1 .  gabapentin (NEURONTIN) 300 MG capsule, Take 1 capsule (300 mg total) by mouth at bedtime., Disp: 90 capsule, Rfl: 4 .  losartan (COZAAR) 50 MG tablet, Take 1 tablet (50 mg total) by mouth daily., Disp: 90 tablet, Rfl: 3 .  tamoxifen (NOLVADEX) 10 MG tablet, Take 10 mg by mouth daily., Disp: , Rfl:  .  venlafaxine XR (EFFEXOR XR) 37.5 MG 24 hr capsule, Take 1 capsule (37.5 mg total) by mouth daily with breakfast., Disp: 90 capsule, Rfl: 4 .  VITAMIN E PO, Take by mouth., Disp: , Rfl:   EXAM:  Filed Vitals:   12/12/15 1022  BP: 118/70  Pulse: 75  Temp: 98.3 F (36.8 C)    Body mass index is  26.43 kg/(m^2).  GENERAL: vitals reviewed and listed above, alert, oriented, appears well hydrated and in no acute distress  HEENT: atraumatic, conjunttiva clear, no obvious abnormalities on inspection of external nose and ears  NECK: no obvious masses on inspection  ABD: soft, NTTP  GU: normal exam of external genitalia other then mild irritation, sm amount of thick white vaginal dischage, o/w normal exam, pap obtained, no CMT  MS: moves all extremities without noticeable abnormality  PSYCH: pleasant and cooperative, no obvious depression or anxiety  ASSESSMENT AND PLAN:  Discussed the following assessment and plan:  Vaginal discharge  Vaginitis and vulvovaginitis  Cervical cancer screening  -Gc/chlam, yeast, BV testing pending -pap pending -advised tx for yeast in interim -follow up instructions -Patient advised to return or notify a doctor immediately if symptoms worsen or persist or new concerns  arise.  There are no Patient Instructions on file for this visit.   Krista Benton R.

## 2015-12-12 NOTE — Progress Notes (Signed)
Pre visit review using our clinic review tool, if applicable. No additional management support is needed unless otherwise documented below in the visit note. 

## 2015-12-12 NOTE — Addendum Note (Signed)
Addended by: Agnes Lawrence on: 12/12/2015 10:54 AM   Modules accepted: Orders

## 2015-12-13 LAB — CYTOLOGY - PAP

## 2015-12-15 ENCOUNTER — Telehealth: Payer: Self-pay | Admitting: Family Medicine

## 2015-12-15 ENCOUNTER — Telehealth: Payer: Self-pay | Admitting: *Deleted

## 2015-12-15 LAB — CERVICOVAGINAL ANCILLARY ONLY
Bacterial vaginitis: NEGATIVE
Candida vaginitis: POSITIVE — AB

## 2015-12-15 MED ORDER — FLUCONAZOLE 150 MG PO TABS: 150.0000 mg | ORAL_TABLET | Freq: Once | ORAL | Status: DC

## 2015-12-15 NOTE — Telephone Encounter (Signed)
Rx done and I left a detailed message with this information at the pts cell number.   

## 2015-12-15 NOTE — Telephone Encounter (Signed)
Patient called stating she is using the OTC Monistat 7 with no relief for the yeast infection and actually feels worse with the burning on the outer aspect.  States she has used the cream externally also. Questions what she should do?

## 2015-12-15 NOTE — Telephone Encounter (Signed)
Fluconazole 150 mg po times one dose 

## 2015-12-15 NOTE — Telephone Encounter (Signed)
error 

## 2016-01-23 ENCOUNTER — Other Ambulatory Visit: Payer: Commercial Managed Care - HMO

## 2016-01-23 ENCOUNTER — Ambulatory Visit: Payer: Commercial Managed Care - HMO | Admitting: Oncology

## 2016-01-23 NOTE — Progress Notes (Signed)
No show

## 2016-01-25 ENCOUNTER — Telehealth: Payer: Self-pay | Admitting: Oncology

## 2016-01-25 NOTE — Telephone Encounter (Signed)
Patient called to r/s missed 3/6 appt. R/s with HB due to availibility

## 2016-02-07 ENCOUNTER — Ambulatory Visit (HOSPITAL_BASED_OUTPATIENT_CLINIC_OR_DEPARTMENT_OTHER): Payer: Commercial Managed Care - HMO | Admitting: Nurse Practitioner

## 2016-02-07 ENCOUNTER — Other Ambulatory Visit (HOSPITAL_BASED_OUTPATIENT_CLINIC_OR_DEPARTMENT_OTHER): Payer: Commercial Managed Care - HMO

## 2016-02-07 ENCOUNTER — Telehealth: Payer: Self-pay | Admitting: Nurse Practitioner

## 2016-02-07 ENCOUNTER — Ambulatory Visit: Payer: Commercial Managed Care - HMO

## 2016-02-07 ENCOUNTER — Encounter: Payer: Self-pay | Admitting: Nurse Practitioner

## 2016-02-07 VITALS — BP 116/70 | HR 83 | Temp 98.4°F | Resp 18 | Ht 67.0 in | Wt 160.3 lb

## 2016-02-07 DIAGNOSIS — R232 Flushing: Secondary | ICD-10-CM

## 2016-02-07 DIAGNOSIS — C50512 Malignant neoplasm of lower-outer quadrant of left female breast: Secondary | ICD-10-CM | POA: Diagnosis not present

## 2016-02-07 DIAGNOSIS — T451X5A Adverse effect of antineoplastic and immunosuppressive drugs, initial encounter: Secondary | ICD-10-CM

## 2016-02-07 LAB — COMPREHENSIVE METABOLIC PANEL
ALK PHOS: 49 U/L (ref 40–150)
ALT: 10 U/L (ref 0–55)
AST: 13 U/L (ref 5–34)
Albumin: 3.8 g/dL (ref 3.5–5.0)
Anion Gap: 6 mEq/L (ref 3–11)
BUN: 14.4 mg/dL (ref 7.0–26.0)
CALCIUM: 9.2 mg/dL (ref 8.4–10.4)
CO2: 28 mEq/L (ref 22–29)
CREATININE: 0.8 mg/dL (ref 0.6–1.1)
Chloride: 108 mEq/L (ref 98–109)
EGFR: 89 mL/min/{1.73_m2} — ABNORMAL LOW (ref >90)
Glucose: 121 mg/dl (ref 70–140)
Potassium: 4.2 mEq/L (ref 3.5–5.1)
SODIUM: 142 meq/L (ref 136–145)
TOTAL PROTEIN: 7.5 g/dL (ref 6.4–8.3)
Total Bilirubin: 0.52 mg/dL (ref 0.20–1.20)

## 2016-02-07 LAB — CBC WITH DIFFERENTIAL/PLATELET
BASO%: 0.5 % (ref 0.0–2.0)
Basophils Absolute: 0 10*3/uL (ref 0.0–0.1)
EOS%: 1.2 % (ref 0.0–7.0)
Eosinophils Absolute: 0.1 10*3/uL (ref 0.0–0.5)
HCT: 36.4 % (ref 34.8–46.6)
HEMOGLOBIN: 12 g/dL (ref 11.6–15.9)
LYMPH#: 1.7 10*3/uL (ref 0.9–3.3)
LYMPH%: 28.9 % (ref 14.0–49.7)
MCH: 29.3 pg (ref 25.1–34.0)
MCHC: 33 g/dL (ref 31.5–36.0)
MCV: 89 fL (ref 79.5–101.0)
MONO#: 0.3 10*3/uL (ref 0.1–0.9)
MONO%: 5.3 % (ref 0.0–14.0)
NEUT%: 64.1 % (ref 38.4–76.8)
NEUTROS ABS: 3.7 10*3/uL (ref 1.5–6.5)
Platelets: 238 10*3/uL (ref 145–400)
RBC: 4.09 10*6/uL (ref 3.70–5.45)
RDW: 13.6 % (ref 11.2–14.5)
WBC: 5.7 10*3/uL (ref 3.9–10.3)
nRBC: 0 % (ref 0–0)

## 2016-02-07 NOTE — Telephone Encounter (Signed)
appt made and avs printed. Sent pt back to [facility] per 3/21 pof

## 2016-02-07 NOTE — Progress Notes (Signed)
[facility]  [phone] [phone]     ID: Krista Armstrong DOB: 07-Nov-1951  MR#: [id number]  OVF#:[id number]  Patient Care Team: Lucretia Kern, DO as PCP - General (Family Medicine) Fanny Skates, MD as Consulting Physician (General Surgery) Chauncey Cruel, MD as Consulting Physician (Oncology) Thea Silversmith, MD as Consulting Physician (Radiation Oncology) Rockwell Germany, RN as Registered Nurse Mauro Kaufmann, RN as Registered Nurse Holley Bouche, NP as Nurse Practitioner (Nurse Practitioner) Sylvan Cheese, NP as Nurse Practitioner (Nurse Practitioner) PCP: Lucretia Kern., DO OTHER MD: Owens Loffler MD  CHIEF COMPLAINT: estrogen receptor positive breast cancer  CURRENT TREATMENT: tamoxifen  BREAST CANCER HISTORY: From the original intake note:  Krista Armstrong (pronounced "ROZ-mah") had routine screening mammography at the breast Center 04/12/2015, showing a possible asymmetry in the left breast. Bilateral diagnostic mammography with tomosynthesis the left breast ultrasonography 04/26/2015, showed the breast density to be category B. In the left breast there was a persistent area of focal asymmetry in the lower outer quadrant. On physical exam there was a 1 cm firm nodular area at 3:30 o'clock 7 cm from the nipple. Ultrasound confirmed an irregular hypoechoic mass measuring 7 mm corresponding to the palpable abnormality. There were no abnormal appearing axillary lymph nodes.  Biopsy of the left breast mass in question 05/03/2015 showed ([id number]) and invasive ductal carcinoma, grade 1, estrogen receptor 95% positive, progesterone receptor 1% positive, both with strong staining intensity, with an MIB-1 of 5%, and no HER-2 amplification, the signals ratio being 1.43 and the number per cell 2.50.  The patient's subsequent history is as detailed below.  INTERVAL HISTORY: Krista Armstrong returns today for follow-up of her breast cancer. Since her last  visit she has started on tamoxifen. She has some mild vaginal wetness that is not a bother. She has hot flashes, but they are less intense. She takes gabapentin 366m QHS which helps her sleep. She is prescribe venlafaxine but does not take this on a regular basis. She continues to work full time with good energy. She may retire next year.   REVIEW OF SYSTEMS:  A detailed review of systems was otherwise stable, except where noted above.  PAST MEDICAL HISTORY: Past Medical History  Diagnosis Date  . GERD (gastroesophageal reflux disease)   . Hypertension   . Ruptured lumbar disc   . Chronic back pain   . Colon polyps   . Hot flashes   . Breast cancer ([place] 05/03/15    Left Breast -Invasive ductal carcinoma  . Hot flashes   . Family history of breast cancer   . Radiation 07/07/15-08/09/15    Left breast    PAST SURGICAL HISTORY: Past Surgical History  Procedure Laterality Date  . Back surgery  2000, 2009  . Stomach surgery  2006    tumors excised  . Back surgery    . Radioactive seed guided mastectomy with axillary sentinel lymph node biopsy Left 05/30/2015    Procedure: LEFT PARTIAL MASTECTOMY WITH RADIOACTIVE SEED LOCALIZATION, LEFT AXILLARY SENTINEL LYMPH NODE BIOPSY;  Surgeon: HFanny Skates MD;  Location: [facility]  Service: General;  Laterality: Left;  . Ruptured lumbar disc    . Colon polyps      FAMILY HISTORY Family History  Problem Relation Age of Onset  . Breast cancer Mother 661 . Stroke Father   . Heart disease Maternal Grandmother   . Colon cancer Neg Hx   . Colon polyps Neg Hx   .  Diabetes Neg Hx   . Kidney disease Neg Hx   . Esophageal cancer Neg Hx   . Gallbladder disease Neg Hx   . Cervical cancer Maternal Aunt   the patient's father died at the age of 58 from a ruptured aortic aneurysm. The patient's mother was diagnosed with breast cancer at the age of 65 and died within a year from that problem. The patient had no brothers, one sister.  There is no other history of breast or ovarian cancer in the family.  GYNECOLOGIC HISTORY:  No LMP recorded. Patient is postmenopausal. Menarche age 4, first live birth age 55. The patient is GX P3. She stopped having periods in the late 1990s. She did not take hormone replacement. She did take oral contraceptives remotely for approximately 10 years with no complications.  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. She is single. At home she lives with her daughter Krista Armstrong, who is currently unemployed but generally works as an Designer, multimedia out of her home. The patient's 2 other children are Krista Armstrong, who lives in [facility] [place] [facility] and works as a better urinary and; and Krista Armstrong, who lives in [place] and works as an Web designer. The patient has 4 grandchildren. She [employer]ends [employer].     ADVANCED DIRECTIVES: not in place;  At the initial clinic visit 02/07/2016 the patient was given the appropriate forms to complete and notarize at her discretion.  HEALTH MAINTENANCE: Social History  Substance Use Topics  . Smoking status: Never Smoker   . Smokeless tobacco: Never Used  . Alcohol Use: 4.2 oz/week    7 Glasses of wine per week     Comment: wine every  night      Colonoscopy:03/16/2015  PAP:  Bone density:never  Lipid panel:  Allergies  Allergen Reactions  . Vicodin [Hydrocodone-Acetaminophen] Other (See Comments)    Massive headache    Current Outpatient Prescriptions  Medication Sig Dispense Refill  . amLODipine (NORVASC) 5 MG tablet Take 1 tablet (5 mg total) by mouth daily. 90 tablet 3  . Biotin 1 MG CAPS Take by mouth.    . esomeprazole (NEXIUM) 40 MG capsule Take 1 capsule (40 mg total) by mouth daily. 90 capsule 1  . gabapentin (NEURONTIN) 300 MG capsule Take 1 capsule (300 mg total) by mouth at bedtime. 90 capsule 4  . losartan (COZAAR) 50 MG tablet Take 1 tablet (50  mg total) by mouth daily. 90 tablet 3  . tamoxifen (NOLVADEX) 10 MG tablet Take 10 mg by mouth daily.    [place] Kitchen venlafaxine XR (EFFEXOR XR) 37.5 MG 24 hr capsule Take 1 capsule (37.5 mg total) by mouth daily with breakfast. 90 capsule 4  . VITAMIN E PO Take by mouth.    . fluconazole (DIFLUCAN) 150 MG tablet Take 1 tablet (150 mg total) by mouth once. (Patient not taking: Reported on 02/07/2016) 1 tablet 0   No current facility-administered medications for this visit.    OBJECTIVE: middle-aged African-American woman who appears younger than stated age 25 Vitals:   02/07/16 1512  BP: 116/70  Pulse: 83  Temp: 98.4 F (36.9 C)  Resp: 18     Body mass index is 25.1 kg/(m^2).    ECOG FS:1 - Symptomatic but completely ambulatory  Skin: warm, dry  HEENT: sclerae anicteric, conjunctivae pink, oropharynx clear. No thrush or mucositis.  Lymph Nodes: No cervical or supraclavicular lymphadenopathy  Lungs: clear  to auscultation bilaterally, no rales, wheezes, or rhonci  Heart: regular rate and rhythm  Abdomen: round, soft, non tender, positive bowel sounds  Musculoskeletal: No focal spinal tenderness, no peripheral edema  Neuro: non focal, well oriented, positive affect  Breast: deferred  LAB RESULTS:  CMP     Component Value Date/Time   NA 142 11/17/2015 1543   NA 141 05/11/2015 0756   K 4.2 11/17/2015 1543   K 4.1 05/11/2015 0756   CL 106 11/17/2015 1543   CO2 29 11/17/2015 1543   CO2 28 05/11/2015 0756   GLUCOSE 89 11/17/2015 1543   GLUCOSE 110 05/11/2015 0756   BUN 12 11/17/2015 1543   BUN 11.7 05/11/2015 0756   CREATININE 0.65 11/17/2015 1543   CREATININE 0.8 05/11/2015 0756   CALCIUM 9.3 11/17/2015 1543   CALCIUM 9.4 05/11/2015 0756   PROT 7.5 11/17/2015 1543   PROT 7.6 05/11/2015 0756   ALBUMIN 4.0 11/17/2015 1543   ALBUMIN 4.0 05/11/2015 0756   AST 14 11/17/2015 1543   AST 16 05/11/2015 0756   ALT 8 11/17/2015 1543   ALT 13 05/11/2015 0756   ALKPHOS 63 11/17/2015 1543    ALKPHOS 74 05/11/2015 0756   BILITOT 0.8 11/17/2015 1543   BILITOT 0.53 05/11/2015 0756    INo results found for: SPEP, UPEP  Lab Results  Component Value Date   WBC 6.1 11/17/2015   NEUTROABS 4.1 11/17/2015   HGB 11.9* 11/17/2015   HCT 36.4 11/17/2015   MCV 86.6 11/17/2015   PLT 258.0 11/17/2015      Chemistry      Component Value Date/Time   NA 142 11/17/2015 1543   NA 141 05/11/2015 0756   K 4.2 11/17/2015 1543   K 4.1 05/11/2015 0756   CL 106 11/17/2015 1543   CO2 29 11/17/2015 1543   CO2 28 05/11/2015 0756   BUN 12 11/17/2015 1543   BUN 11.7 05/11/2015 0756   CREATININE 0.65 11/17/2015 1543   CREATININE 0.8 05/11/2015 0756      Component Value Date/Time   CALCIUM 9.3 11/17/2015 1543   CALCIUM 9.4 05/11/2015 0756   ALKPHOS 63 11/17/2015 1543   ALKPHOS 74 05/11/2015 0756   AST 14 11/17/2015 1543   AST 16 05/11/2015 0756   ALT 8 11/17/2015 1543   ALT 13 05/11/2015 0756   BILITOT 0.8 11/17/2015 1543   BILITOT 0.53 05/11/2015 0756      No results found for: LABCA2  No components found for: EXBMW413  No results for input(s): INR in the last 168 hours.  Urinalysis No results found for: COLORURINE, APPEARANCEUR, LABSPEC, PHURINE, GLUCOSEU, HGBUR, BILIRUBINUR, KETONESUR, PROTEINUR, UROBILINOGEN, NITRITE, LEUKOCYTESUR  STUDIES: No results found.  ASSESSMENT: 65 y.o. [place] woman status post left breast biopsy 05/03/2015 for a clinical T1b N0, stage IA invasive ductal carcinoma, grade 1, estrogen receptor strongly positive, progesterone receptor minimally positive, with an MIB-1 of 5% and no HER-2 amplification.  (1) left lumpectomy and sentinel lymph node sampling 05/30/2015 showed a pT1b pN0, stage IA invasive ductal carcinoma, grade 1, with close but negative margins, and repeat HER-2 again negative  (2) Oncotype score of 20 (intermediate risk) predicts a 10 year risk of recurrence outside the breast of 13% if the patient's only systemic therapy is  tamoxifen for 5 years.   (a) the patient opted against chemotherapy given the very marginal benefits in her situation  (3) adjuvant radiation 07/07/2015-08/09/2015:  Left breast/ 42.72 Gy at 2.67 Gy per fraction x 16  fractions.  Left breast boost/ 10 Gy at 2 Gy per fraction x 5 fractions  (4) tamoxifen started 11/20/2015  (5) genetics testing 09/05/2015 through the [place] gene panel performed by [facility] offers found no deleterious mutations in BRCA1 c.68_69delAG (also known as 185delAG or 187delAGE), BRCA1 c.5266dupC (also known as 5382insC or 5385insC) and BRCA2 c.5946delT (also known as 6174delT).   PLAN: Ernesta looks and feels well today. She is tolerating the tamoxifen with few issues. The gabapentin has been helpful with her hot flashes. She is going to start taking her venlafaxine on a regular basis and should see improvement with this addition.   She is due for a repeat mammogram this May. I have placed orders for this to be performed at the breast center. She will return in June for a follow up visit. She understands and agrees with this plan. She understands and agrees with this plan. She knows the goal of treatment in her case is cure. She has been encouraged to call with any issues that might arise before her next visit here.  Laurie Panda, NP   02/07/2016 3:58 PM

## 2016-02-29 ENCOUNTER — Other Ambulatory Visit: Payer: Self-pay | Admitting: *Deleted

## 2016-02-29 MED ORDER — GABAPENTIN 300 MG PO CAPS: 600.0000 mg | ORAL_CAPSULE | Freq: Every day | ORAL | Status: DC

## 2016-04-18 ENCOUNTER — Ambulatory Visit
Admission: RE | Admit: 2016-04-18 | Discharge: 2016-04-18 | Disposition: A | Discharge status: Home / Self Care | Payer: Commercial Managed Care - HMO | Source: Ambulatory Visit | Attending: Nurse Practitioner | Admitting: Nurse Practitioner

## 2016-04-18 DIAGNOSIS — C50512 Malignant neoplasm of lower-outer quadrant of left female breast: Secondary | ICD-10-CM

## 2016-04-23 ENCOUNTER — Telehealth: Payer: Self-pay | Admitting: Family Medicine

## 2016-04-23 NOTE — Telephone Encounter (Signed)
Have left several messages to inform pt her previous appointment with Dr Maudie Mercury on tues 6/7 has been cancelled. Pt wanted a pap if not had one in a year. Pt had pap 12/12/15 at an appointment with dr Maudie Mercury.

## 2016-04-26 ENCOUNTER — Ambulatory Visit: Payer: Commercial Managed Care - HMO | Admitting: Family Medicine

## 2016-04-27 NOTE — Telephone Encounter (Signed)
Pt called back and is aware no need for pap at this time.

## 2016-05-09 ENCOUNTER — Other Ambulatory Visit (HOSPITAL_BASED_OUTPATIENT_CLINIC_OR_DEPARTMENT_OTHER): Payer: Commercial Managed Care - HMO

## 2016-05-09 ENCOUNTER — Ambulatory Visit (HOSPITAL_BASED_OUTPATIENT_CLINIC_OR_DEPARTMENT_OTHER): Payer: Commercial Managed Care - HMO | Admitting: Oncology

## 2016-05-09 VITALS — BP 115/73 | HR 71 | Temp 99.2°F | Resp 18 | Ht 67.0 in | Wt 160.9 lb

## 2016-05-09 DIAGNOSIS — C50512 Malignant neoplasm of lower-outer quadrant of left female breast: Secondary | ICD-10-CM

## 2016-05-09 DIAGNOSIS — Z79810 Long term (current) use of selective estrogen receptor modulators (SERMs): Secondary | ICD-10-CM | POA: Diagnosis not present

## 2016-05-09 DIAGNOSIS — Z853 Personal history of malignant neoplasm of breast: Secondary | ICD-10-CM

## 2016-05-09 LAB — CBC WITH DIFFERENTIAL/PLATELET
BASO%: 1.2 % (ref 0.0–2.0)
Basophils Absolute: 0.1 10*3/uL (ref 0.0–0.1)
EOS ABS: 0.1 10*3/uL (ref 0.0–0.5)
EOS%: 2.3 % (ref 0.0–7.0)
HEMATOCRIT: 37.1 % (ref 34.8–46.6)
HGB: 12.1 g/dL (ref 11.6–15.9)
LYMPH#: 1.8 10*3/uL (ref 0.9–3.3)
LYMPH%: 30.5 % (ref 14.0–49.7)
MCH: 28.9 pg (ref 25.1–34.0)
MCHC: 32.5 g/dL (ref 31.5–36.0)
MCV: 88.7 fL (ref 79.5–101.0)
MONO#: 0.4 10*3/uL (ref 0.1–0.9)
MONO%: 6.4 % (ref 0.0–14.0)
NEUT%: 59.6 % (ref 38.4–76.8)
NEUTROS ABS: 3.5 10*3/uL (ref 1.5–6.5)
PLATELETS: 232 10*3/uL (ref 145–400)
RBC: 4.18 10*6/uL (ref 3.70–5.45)
RDW: 13.3 % (ref 11.2–14.5)
WBC: 5.8 10*3/uL (ref 3.9–10.3)

## 2016-05-09 LAB — COMPREHENSIVE METABOLIC PANEL
ALBUMIN: 3.9 g/dL (ref 3.5–5.0)
ALK PHOS: 41 U/L (ref 40–150)
ALT: 12 U/L (ref 0–55)
ANION GAP: 8 meq/L (ref 3–11)
AST: 13 U/L (ref 5–34)
BILIRUBIN TOTAL: 0.41 mg/dL (ref 0.20–1.20)
BUN: 13.6 mg/dL (ref 7.0–26.0)
CALCIUM: 9.2 mg/dL (ref 8.4–10.4)
CO2: 25 mEq/L (ref 22–29)
CREATININE: 0.8 mg/dL (ref 0.6–1.1)
Chloride: 107 mEq/L (ref 98–109)
EGFR: 85 mL/min/{1.73_m2} — AB (ref >90)
Glucose: 97 mg/dl (ref 70–140)
Potassium: 4.4 mEq/L (ref 3.5–5.1)
Sodium: 140 mEq/L (ref 136–145)
TOTAL PROTEIN: 7.6 g/dL (ref 6.4–8.3)

## 2016-05-09 MED ORDER — TAMOXIFEN CITRATE 20 MG PO TABS: 20.0000 mg | ORAL_TABLET | Freq: Every day | ORAL | Status: DC

## 2016-05-09 MED ORDER — GABAPENTIN 300 MG PO CAPS: 600.0000 mg | ORAL_CAPSULE | Freq: Every day | ORAL | Status: DC

## 2016-05-09 NOTE — Progress Notes (Signed)
[facility]  [phone] [phone]     ID: Krista Armstrong DOB: 12/17/1950  MR#: [id number]  BTD#:[id number]  Patient Care Team: Lucretia Kern, DO as PCP - General (Family Medicine) Fanny Skates, MD as Consulting Physician (General Surgery) Chauncey Cruel, MD as Consulting Physician (Oncology) Thea Silversmith, MD as Consulting Physician (Radiation Oncology) Rockwell Germany, RN as Registered Nurse Mauro Kaufmann, RN as Registered Nurse Holley Bouche, NP as Nurse Practitioner (Nurse Practitioner) Sylvan Cheese, NP as Nurse Practitioner (Nurse Practitioner) PCP: Lucretia Kern., DO OTHER MD: Owens Loffler MD  CHIEF COMPLAINT: estrogen receptor positive breast cancer  CURRENT TREATMENT: tamoxifen  BREAST CANCER HISTORY: From the original intake note:  Krista (pronounced "ROZ-mah") had routine screening mammography at the breast Center 04/12/2015, showing a possible asymmetry in the left breast. Bilateral diagnostic mammography with tomosynthesis the left breast ultrasonography 04/26/2015, showed the breast density to be category B. In the left breast there was a persistent area of focal asymmetry in the lower outer quadrant. On physical exam there was a 1 cm firm nodular area at 3:30 o'clock 7 cm from the nipple. Ultrasound confirmed an irregular hypoechoic mass measuring 7 mm corresponding to the palpable abnormality. There were no abnormal appearing axillary lymph nodes.  Biopsy of the left breast mass in question 05/03/2015 showed ([id number]) and invasive ductal carcinoma, grade 1, estrogen receptor 95% positive, progesterone receptor 1% positive, both with strong staining intensity, with an MIB-1 of 5%, and no HER-2 amplification, the signals ratio being 1.43 and the number per cell 2.50.  The patient's subsequent history is as detailed below.  INTERVAL HISTORY: Krista Armstrong returns today for follow-up of her estrogen receptor positive  breast cancer. She continues on tamoxifen. She does have some hot flashes, but they are "not bad". She takes gabapentin at night to help with the nighttime problem. She stopped the venlafaxine: It was not helping overall she is tolerating this "fine". She obtains a drug at a very good price   She is planning to remarry (actually to her first husband) in October and moved to [place].  REVIEW OF SYSTEMS: She does not exercise regularly but takes opportunities to walk up and down stairs and is very busy with housework at home. A detailed review of systems today was otherwise unremarkable.  PAST MEDICAL HISTORY: Past Medical History  Diagnosis Date  . GERD (gastroesophageal reflux disease)   . Hypertension   . Ruptured lumbar disc   . Chronic back pain   . Colon polyps   . Hot flashes   . Breast cancer ([place]) 05/03/15    Left Breast -Invasive ductal carcinoma  . Hot flashes   . Family history of breast cancer   . Radiation 07/07/15-08/09/15    Left breast    PAST SURGICAL HISTORY: Past Surgical History  Procedure Laterality Date  . Back surgery  2000, 2009  . Stomach surgery  2006    tumors excised  . Back surgery    . Radioactive seed guided mastectomy with axillary sentinel lymph node biopsy Left 05/30/2015    Procedure: LEFT PARTIAL MASTECTOMY WITH RADIOACTIVE SEED LOCALIZATION, LEFT AXILLARY SENTINEL LYMPH NODE BIOPSY;  Surgeon: Fanny Skates, MD;  Location: [facility];  Service: General;  Laterality: Left;  . Ruptured lumbar disc    . Colon polyps      FAMILY HISTORY Family History  Problem Relation Age of Onset  . Breast cancer Mother 79  . Stroke Father   .  Heart disease Maternal Grandmother   . Colon cancer Neg Hx   . Colon polyps Neg Hx   . Diabetes Neg Hx   . Kidney disease Neg Hx   . Esophageal cancer Neg Hx   . Gallbladder disease Neg Hx   . Cervical cancer Maternal Aunt   the patient's father died at the age of 69 from a ruptured aortic aneurysm.  The patient's mother was diagnosed with breast cancer at the age of 81 and died within a year from that problem. The patient had no brothers, one sister. There is no other history of breast or ovarian cancer in the family.  GYNECOLOGIC HISTORY:  No LMP recorded. Patient is postmenopausal. Menarche age 63, first live birth age 61. The patient is GX P3. She stopped having periods in the late 1990s. She did not take hormone replacement. She did take oral contraceptives remotely for approximately 10 years with no complications.  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. She is single. At home she lives with her daughter Krista Armstrong, who is currently unemployed but generally works as an Designer, multimedia out of her home. The patient's 2 other children are Krista Armstrong, who lives in [place] [facility] and works as a better urinary and; and Transport planner, who lives in [place] and works as an Web designer. The patient has 4 grandchildren. She attends [employer].     ADVANCED DIRECTIVES: not in place;  At the initial clinic visit  the patient was given the appropriate forms to complete and notarize at her discretion.  HEALTH MAINTENANCE: Social History  Substance Use Topics  . Smoking status: Never Smoker   . Smokeless tobacco: Never Used  . Alcohol Use: 4.2 oz/week    7 Glasses of wine per week     Comment: wine every  night      Colonoscopy:03/16/2015  PAP:  Bone density:never  Lipid panel:  Allergies  Allergen Reactions  . Vicodin [Hydrocodone-Acetaminophen] Other (See Comments)    Massive headache    Current Outpatient Prescriptions  Medication Sig Dispense Refill  . amLODipine (NORVASC) 5 MG tablet Take 1 tablet (5 mg total) by mouth daily. 90 tablet 3  . Biotin 1 MG CAPS Take by mouth.    . esomeprazole (NEXIUM) 40 MG capsule Take 1 capsule (40 mg total) by mouth daily. 90 capsule 1  .  fluconazole (DIFLUCAN) 150 MG tablet Take 1 tablet (150 mg total) by mouth once. (Patient not taking: Reported on 02/07/2016) 1 tablet 0  . gabapentin (NEURONTIN) 300 MG capsule Take 2 capsules (600 mg total) by mouth at bedtime. 180 capsule 4  . losartan (COZAAR) 50 MG tablet Take 1 tablet (50 mg total) by mouth daily. 90 tablet 3  . tamoxifen (NOLVADEX) 10 MG tablet Take 10 mg by mouth daily.    Marland Kitchen venlafaxine XR (EFFEXOR XR) 37.5 MG 24 hr capsule Take 1 capsule (37.5 mg total) by mouth daily with breakfast. 90 capsule 4  . VITAMIN E PO Take by mouth.     No current facility-administered medications for this visit.    OBJECTIVE: middle-aged African-American woman In no acute distress Filed Vitals:   05/09/16 1550  BP: 115/73  Pulse: 71  Temp: 99.2 F (37.3 C)  Resp: 18     Body mass index is 25.19 kg/(m^2).    ECOG FS:0 - Asymptomatic  Sclerae unicteric, pupils round and equal Oropharynx clear and moist--  no thrush or other lesions No cervical or supraclavicular adenopathy Lungs no rales or rhonchi Heart regular rate and rhythm Abd soft, nontender, positive bowel sounds MSK no focal spinal tenderness, no upper extremity lymphedema Neuro: nonfocal, well oriented, appropriate affect Breasts: The right breast is unremarkable. The left breast is status post lumpectomy and radiation. It is smaller and firmer than the right breast. There is no evidence of local recurrence. The left axilla is benign   LAB RESULTS:  CMP     Component Value Date/Time   NA 142 02/07/2016 1456   NA 142 11/17/2015 1543   K 4.2 02/07/2016 1456   K 4.2 11/17/2015 1543   CL 106 11/17/2015 1543   CO2 28 02/07/2016 1456   CO2 29 11/17/2015 1543   GLUCOSE 121 02/07/2016 1456   GLUCOSE 89 11/17/2015 1543   BUN 14.4 02/07/2016 1456   BUN 12 11/17/2015 1543   CREATININE 0.8 02/07/2016 1456   CREATININE 0.65 11/17/2015 1543   CALCIUM 9.2 02/07/2016 1456   CALCIUM 9.3 11/17/2015 1543   PROT 7.5 02/07/2016  1456   PROT 7.5 11/17/2015 1543   ALBUMIN 3.8 02/07/2016 1456   ALBUMIN 4.0 11/17/2015 1543   AST 13 02/07/2016 1456   AST 14 11/17/2015 1543   ALT 10 02/07/2016 1456   ALT 8 11/17/2015 1543   ALKPHOS 49 02/07/2016 1456   ALKPHOS 63 11/17/2015 1543   BILITOT 0.52 02/07/2016 1456   BILITOT 0.8 11/17/2015 1543    INo results found for: SPEP, UPEP  Lab Results  Component Value Date   WBC 5.8 05/09/2016   NEUTROABS 3.5 05/09/2016   HGB 12.1 05/09/2016   HCT 37.1 05/09/2016   MCV 88.7 05/09/2016   PLT 232 05/09/2016      Chemistry      Component Value Date/Time   NA 142 02/07/2016 1456   NA 142 11/17/2015 1543   K 4.2 02/07/2016 1456   K 4.2 11/17/2015 1543   CL 106 11/17/2015 1543   CO2 28 02/07/2016 1456   CO2 29 11/17/2015 1543   BUN 14.4 02/07/2016 1456   BUN 12 11/17/2015 1543   CREATININE 0.8 02/07/2016 1456   CREATININE 0.65 11/17/2015 1543      Component Value Date/Time   CALCIUM 9.2 02/07/2016 1456   CALCIUM 9.3 11/17/2015 1543   ALKPHOS 49 02/07/2016 1456   ALKPHOS 63 11/17/2015 1543   AST 13 02/07/2016 1456   AST 14 11/17/2015 1543   ALT 10 02/07/2016 1456   ALT 8 11/17/2015 1543   BILITOT 0.52 02/07/2016 1456   BILITOT 0.8 11/17/2015 1543      No results found for: LABCA2  No components found for: LABCA125  No results for input(s): INR in the last 168 hours.  Urinalysis No results found for: COLORURINE, APPEARANCEUR, LABSPEC, PHURINE, GLUCOSEU, HGBUR, BILIRUBINUR, KETONESUR, PROTEINUR, UROBILINOGEN, NITRITE, LEUKOCYTESUR  STUDIES: Mm Diag Breast Tomo Bilateral  04/18/2016  CLINICAL DATA:  65 year old female presenting for routine postlumpectomy follow-up status post left breast lumpectomy in July of 2016. EXAM: 2D DIGITAL DIAGNOSTIC BILATERAL MAMMOGRAM WITH CAD AND ADJUNCT TOMO COMPARISON:  Previous exam(s). ACR Breast Density Category b: There are scattered areas of fibroglandular density. FINDINGS: Expected post lumpectomy changes in the  posterior lateral left breast. No suspicious calcifications, masses or areas of distortion are seen in the bilateral breasts. Mammographic images were processed with CAD. IMPRESSION: Expected post lumpectomy changes in the left breast. No mammographic evidence of malignancy in the bilateral breasts. RECOMMENDATION: Diagnostic  mammogram is suggested in 1 year. (Code:DM-B-01Y) I have discussed the findings and recommendations with the patient. Results were also provided in writing at the conclusion of the visit. If applicable, a reminder letter will be sent to the patient regarding the next appointment. BI-RADS CATEGORY  2: Benign. Electronically Signed   By: Ammie Ferrier M.D.   On: 04/18/2016 16:51    ASSESSMENT: 65 y.o. [place] woman status post left breast lower outer quadrant biopsy 05/03/2015 for a clinical T1b N0, stage IA invasive ductal carcinoma, grade 1, estrogen receptor strongly positive, progesterone receptor minimally positive, with an MIB-1 of 5% and no HER-2 amplification.  (1) left lumpectomy and sentinel lymph node sampling 05/30/2015 showed a pT1b pN0, stage IA invasive ductal carcinoma, grade 1, with close but negative margins, and repeat HER-2 again negative  (2) Oncotype score of 20 (intermediate risk) predicts a 10 year risk of recurrence outside the breast of 13% if the patient's only systemic therapy is tamoxifen for 5 years.   (a) the patient opted against chemotherapy given the very marginal benefits in her situation  (3) adjuvant radiation 07/07/2015-08/09/2015:  Left breast/ 42.72 Gy at 2.67 Gy per fraction x 16 fractions.  Left breast boost/ 10 Gy at 2 Gy per fraction x 5 fractions  (4) tamoxifen started 11/20/2015  (5) genetics testing 09/05/2015 through the [facility] gene panel performed by [facility] offers found no deleterious mutations in BRCA1 c.68_69delAG (also known as 185delAG or 187delAGE), BRCA1 c.5266dupC (also known as 5382insC or 5385insC) and  BRCA2 c.5946delT (also known as 6174delT).   PLAN: Tykiera is now a year out from definitive surgery for her breast cancer with no evidence of disease recurrence. This is very favorable.  The plan is to continue tamoxifen to a total of 5 years. Given the small size of her tumor and the small benefits from 10 years of tamoxifen I would not recommend she continue anti-estrogens beyond the first 5 years  She is planning to move to [place] in October. She will see Dr. Dalbert Batman before that. She will come by at some point in September to get a copy of her records. She will establish yourself with an oncologist in [place] shortly after moving there.  She knows we will be glad to see her at any point in the future if we can be of help.  Chauncey Cruel, MD   05/09/2016 3:52 PM

## 2016-08-21 IMAGING — US US BREAST BX W LOC DEV 1ST LESION IMG BX SPEC US GUIDE*L*
1 series · 8 of 8 positions shown · non-contrast
Comparison: Previous exam(s).

ADDENDUM:
Pathology reveals Left breast Grade I Invasive Ductal Carcinoma.
This was found to be concordant by Orta. Alpesh Acker. Pathology results
were discussed with the patient via telephone. The patient reported
tenderness at the biopsy site. Post biopsy instructions were
reviewed and questions were answered. The patient was encouraged to
call The [REDACTED] with any additional
questions and or concerns. The patient was referred to [REDACTED] [facility] on May 11, 2015.

Pathology results reported by Nahuee Bercovich RN on May 04, 2015.
CLINICAL DATA: 64-year-old female for ultrasound-guided left breast
biopsy
EXAM:
ULTRASOUND GUIDED LEFT BREAST CORE NEEDLE BIOPSY

[Series 1: advbreast · 8 of 8 slices shown]
[im 1/8]
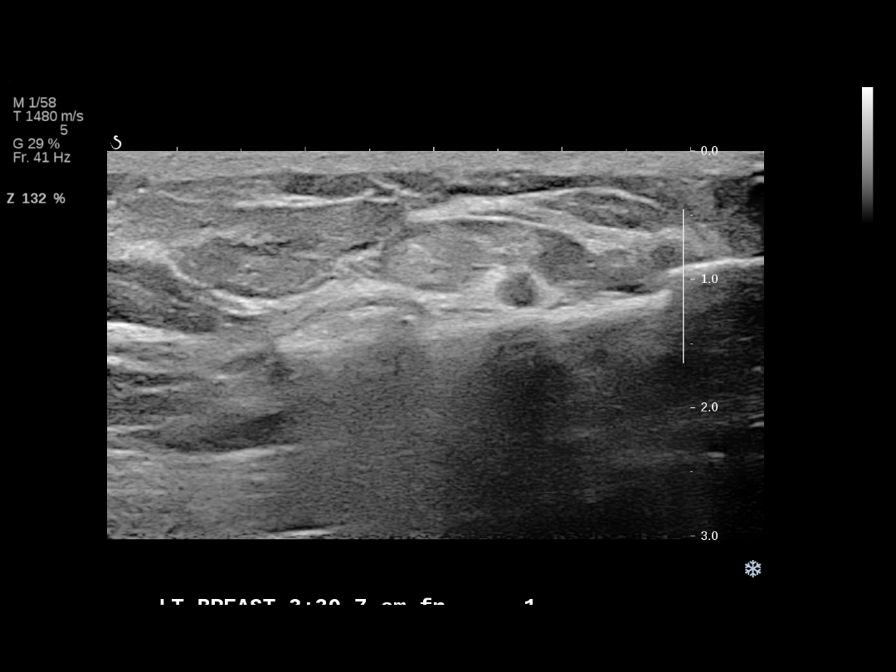
[im 2/8]
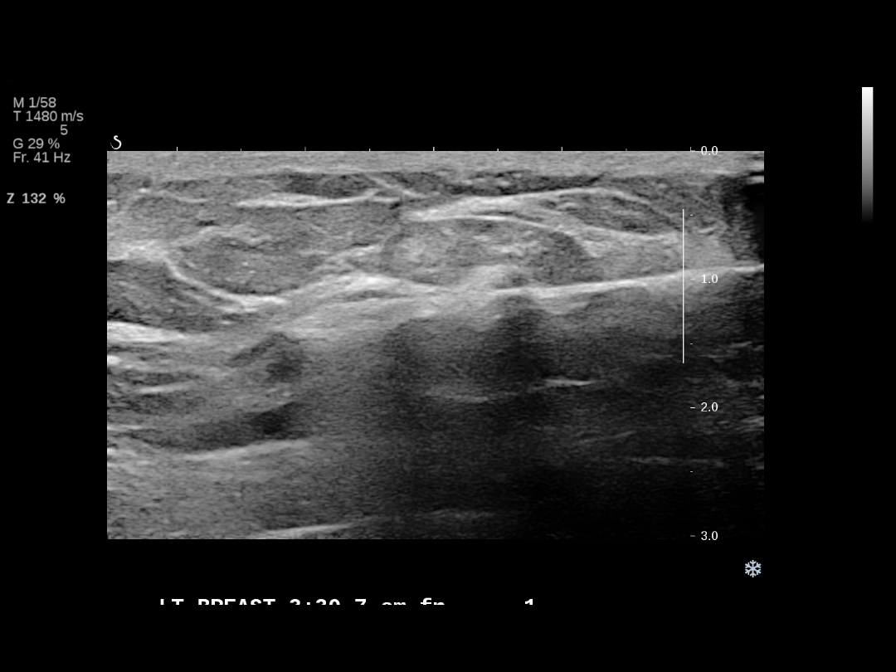
[im 3/8]
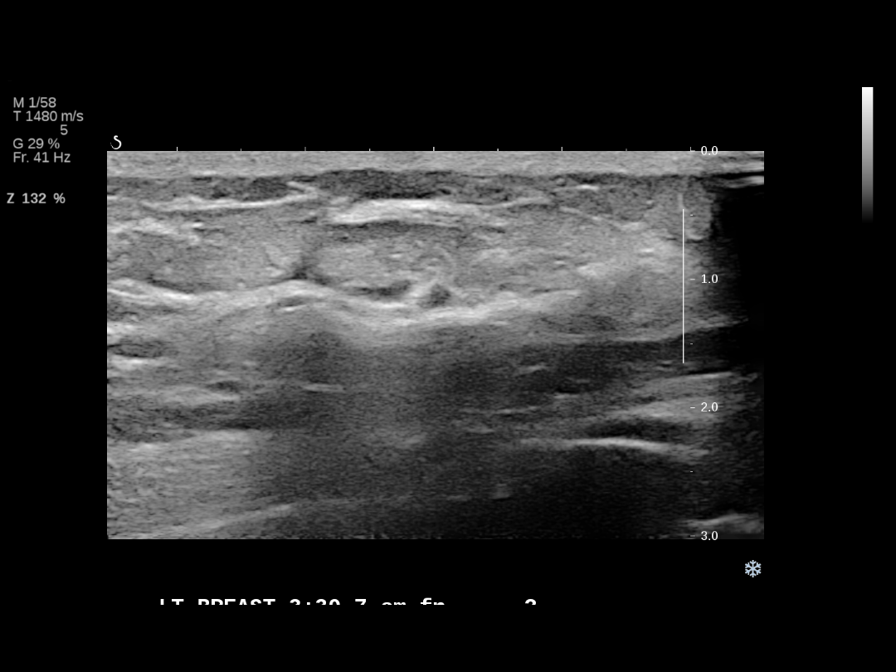
[im 4/8]
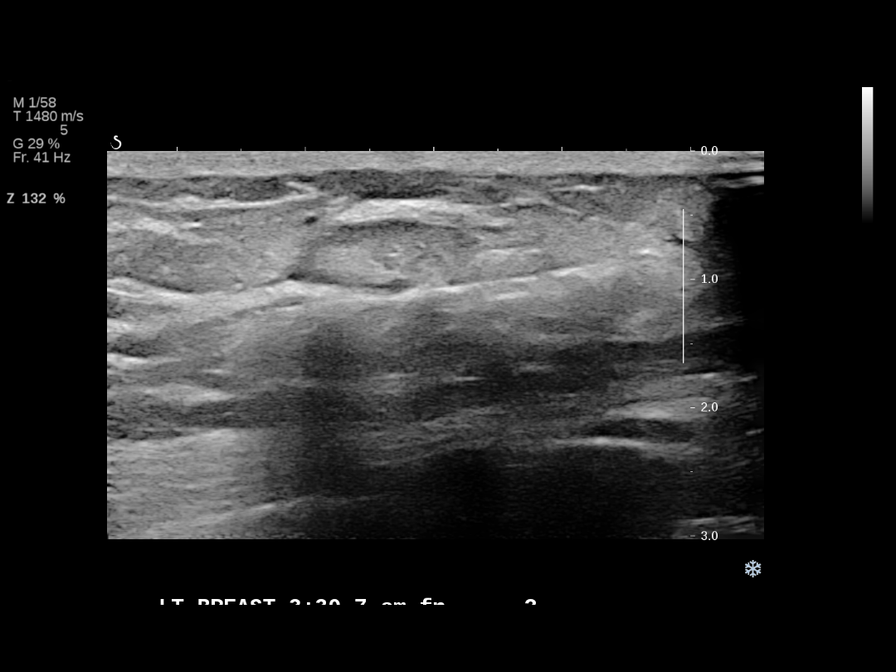
[im 5/8]
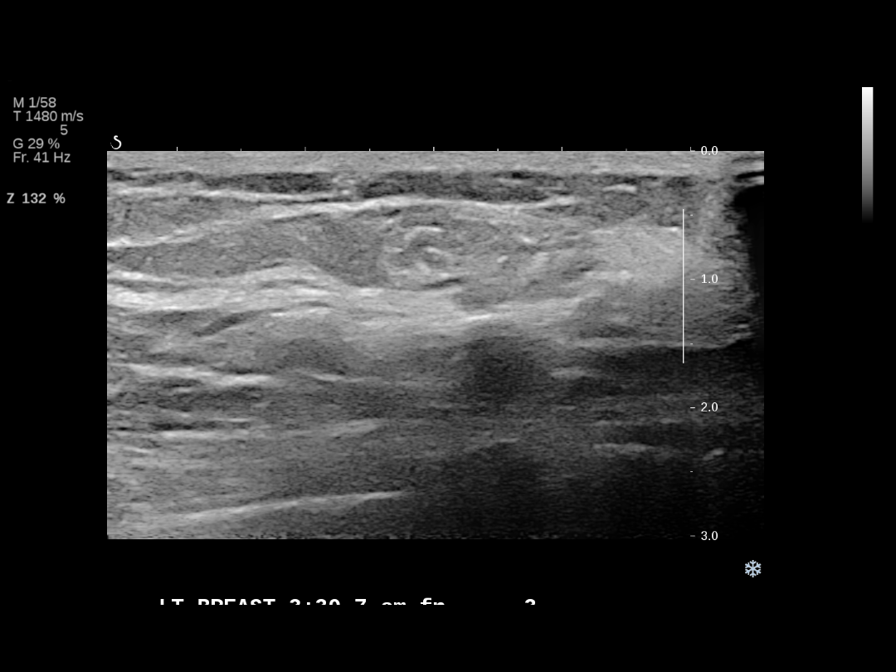
[im 6/8]
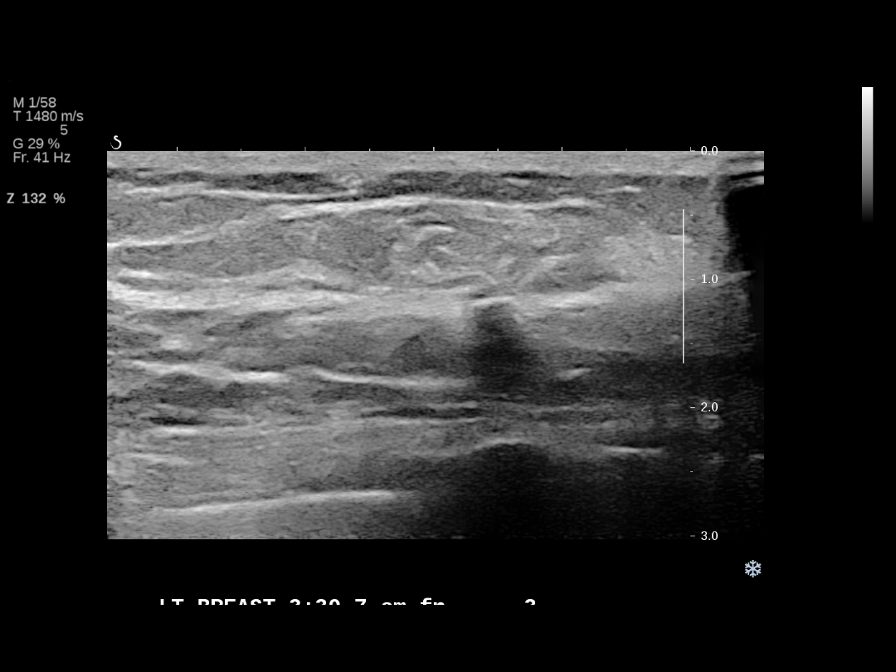
[im 7/8]
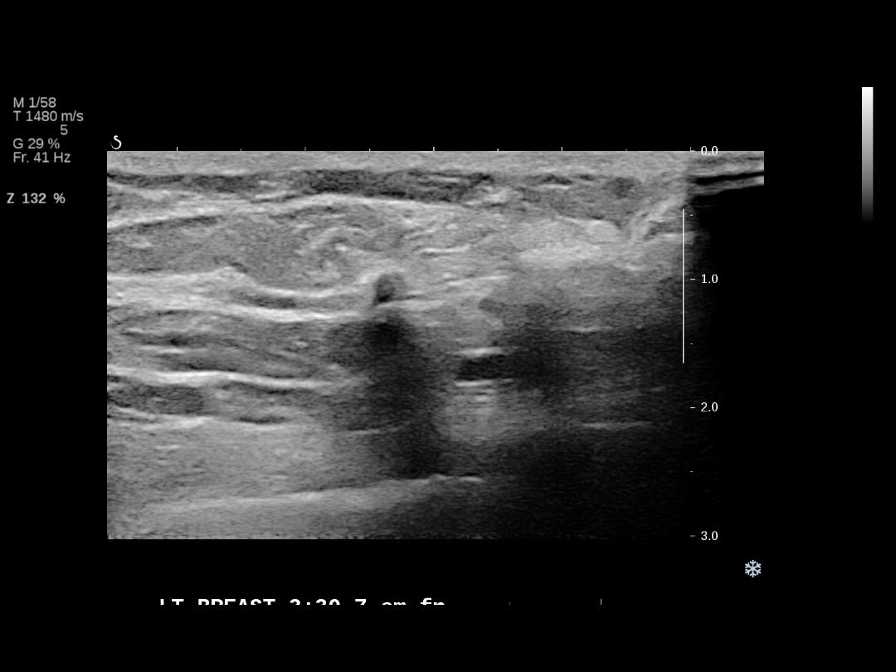
[im 8/8]
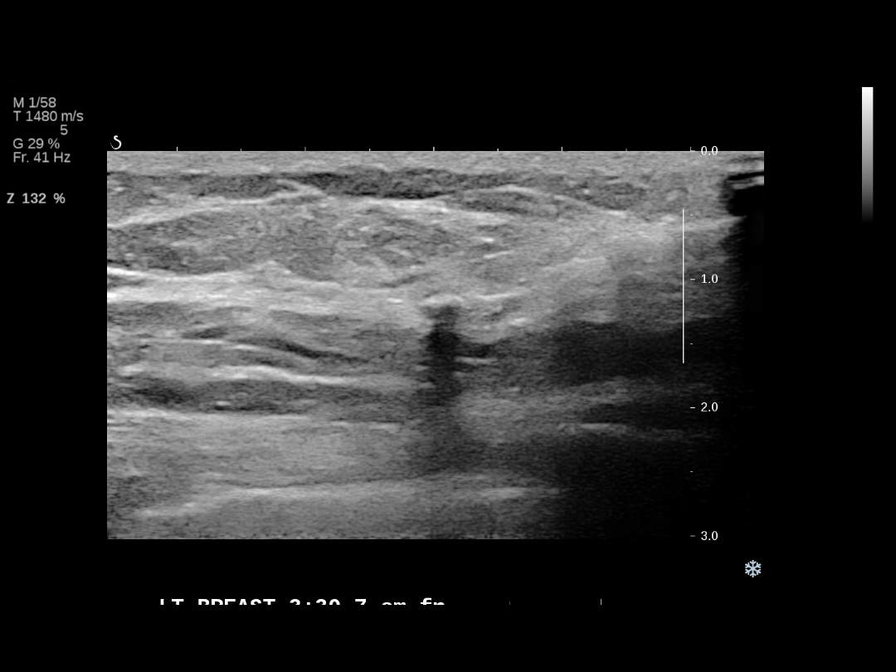

[8 of 8 positions shown; findings below may reference images not displayed]

PROCEDURE:
I met with the patient and we discussed the procedure of
ultrasound-guided biopsy, including benefits and alternatives. We
discussed the high likelihood of a successful procedure. We
discussed the risks of the procedure including infection, bleeding,
tissue injury, clip migration, and inadequate sampling. Informed
written consent was given. The usual time-out protocol was performed
immediately prior to the procedure.

Using sterile technique and 2% Lidocaine as local anesthetic, under
direct ultrasound visualization, a 12 gauge vacuum-assisted device
was used to perform biopsy of a suspicious left breast mass at 330,
7 cm from the nipple using a lateral to medial approach. At the
conclusion of the procedure, a ribbon shaped tissue marker clip was
deployed into the biopsy cavity. Follow-up 2-view mammogram was
performed and dictated separately.
IMPRESSION: Ultrasound-guided biopsy of a suspicious left breast mass at 330. No
apparent complications.

## 2016-10-18 ENCOUNTER — Ambulatory Visit (INDEPENDENT_AMBULATORY_CARE_PROVIDER_SITE_OTHER): Payer: Commercial Managed Care - HMO | Admitting: Family Medicine

## 2016-10-18 ENCOUNTER — Encounter: Payer: Self-pay | Admitting: Family Medicine

## 2016-10-18 VITALS — BP 120/82 | HR 97 | Temp 99.2°F | Ht 67.0 in | Wt 161.1 lb

## 2016-10-18 DIAGNOSIS — R300 Dysuria: Secondary | ICD-10-CM | POA: Diagnosis not present

## 2016-10-18 LAB — POCT URINALYSIS DIPSTICK
Bilirubin, UA: NEGATIVE
GLUCOSE UA: NEGATIVE
Ketones, UA: NEGATIVE
Nitrite, UA: NEGATIVE
PH UA: 6
Protein, UA: NEGATIVE
UROBILINOGEN UA: 0.2

## 2016-10-18 MED ORDER — NITROFURANTOIN MONOHYD MACRO 100 MG PO CAPS: 100.0000 mg | ORAL_CAPSULE | Freq: Two times a day (BID) | ORAL | 0 refills | Status: DC

## 2016-10-18 NOTE — Progress Notes (Signed)
HPI:  Acute visit for:  Dysuria: -started about 1 week ago -Symptoms include frequency, urgency and mild dysuria -No fevers, nausea, vomiting, malaise, flank pain, hematuria or vaginal symptoms  ROS: See pertinent positives and negatives per HPI.  Past Medical History:  Diagnosis Date  . Breast cancer ([place]) 05/03/15   Left Breast -Invasive ductal carcinoma  . Chronic back pain   . Colon polyps   . Family history of breast cancer   . GERD (gastroesophageal reflux disease)   . Hot flashes   . Hot flashes   . Hypertension   . Radiation 07/07/15-08/09/15   Left breast  . Ruptured lumbar disc     Past Surgical History:  Procedure Laterality Date  . BACK SURGERY  2000, 2009  . BACK SURGERY    . colon polyps    . RADIOACTIVE SEED GUIDED MASTECTOMY WITH AXILLARY SENTINEL LYMPH NODE BIOPSY Left 05/30/2015   Procedure: LEFT PARTIAL MASTECTOMY WITH RADIOACTIVE SEED LOCALIZATION, LEFT AXILLARY SENTINEL LYMPH NODE BIOPSY;  Surgeon: Fanny Skates, MD;  Location: [facility];  Service: General;  Laterality: Left;  . ruptured lumbar disc    . STOMACH SURGERY  2006   tumors excised    Family History  Problem Relation Age of Onset  . Breast cancer Mother 11  . Stroke Father   . Heart disease Maternal Grandmother   . Colon cancer Neg Hx   . Colon polyps Neg Hx   . Diabetes Neg Hx   . Kidney disease Neg Hx   . Esophageal cancer Neg Hx   . Gallbladder disease Neg Hx   . Cervical cancer Maternal Aunt     Social History   Social History  . Marital status: Married    Spouse name: N/A  . Number of children: 3  . Years of education: N/A   Occupational History  . [place] History Main Topics  . Smoking status: Never Smoker  . Smokeless tobacco: Never Used  . Alcohol use 4.2 oz/week    7 Glasses of wine per week     Comment: wine every  night   . Drug use: No  . Sexual activity: No   Other Topics Concern  . None   Social History Narrative   . None     Current Outpatient Prescriptions:  .  amLODipine (NORVASC) 5 MG tablet, Take 1 tablet (5 mg total) by mouth daily., Disp: 90 tablet, Rfl: 3 .  Biotin 1 MG CAPS, Take by mouth., Disp: , Rfl:  .  esomeprazole (NEXIUM) 40 MG capsule, Take 1 capsule (40 mg total) by mouth daily., Disp: 90 capsule, Rfl: 1 .  gabapentin (NEURONTIN) 300 MG capsule, Take 2 capsules (600 mg total) by mouth at bedtime., Disp: 180 capsule, Rfl: 4 .  losartan (COZAAR) 50 MG tablet, Take 1 tablet (50 mg total) by mouth daily., Disp: 90 tablet, Rfl: 3 .  tamoxifen (NOLVADEX) 20 MG tablet, Take 1 tablet (20 mg total) by mouth daily., Disp: 90 tablet, Rfl: 4  EXAM:  Vitals:   10/18/16 1514  BP: 120/82  Pulse: 97  Temp: 99.2 F (37.3 C)    Body mass index is 25.23 kg/m.  GENERAL: vitals reviewed and listed above, alert, oriented, appears well hydrated and in no acute distress  HEENT: atraumatic, conjunttiva clear, no obvious abnormalities on inspection of external nose and ears  NECK: no obvious masses on inspection  LUNGS: clear to auscultation bilaterally, no wheezes, rales or rhonchi,  good air movement  CV: HRRR, no peripheral edema  ABD: BS+, soft, NTTP, no cva TTP  MS: moves all extremities without noticeable abnormality  PSYCH: pleasant and cooperative, no obvious depression or anxiety  ASSESSMENT AND PLAN:  Discussed the following assessment and plan:  Dysuria  -udip + symptoms c/w likely UTI - opted to treat empirically with macrobid, culture pending -Patient advised to return or notify a doctor immediately if symptoms worsen or persist or new concerns arise.  There are no Patient Instructions on file for this visit.  Colin Benton R., DO

## 2016-10-18 NOTE — Patient Instructions (Addendum)
Take the antibiotic as instructed.  I hope you fell better soon!

## 2016-10-18 NOTE — Progress Notes (Signed)
Pre visit review using our clinic review tool, if applicable. No additional management support is needed unless otherwise documented below in the visit note. 

## 2016-10-21 LAB — URINE CULTURE

## 2016-10-29 ENCOUNTER — Telehealth: Payer: Self-pay | Admitting: Family Medicine

## 2016-10-29 ENCOUNTER — Other Ambulatory Visit (INDEPENDENT_AMBULATORY_CARE_PROVIDER_SITE_OTHER): Payer: Commercial Managed Care - HMO

## 2016-10-29 ENCOUNTER — Other Ambulatory Visit: Payer: Self-pay | Admitting: Family Medicine

## 2016-10-29 DIAGNOSIS — R300 Dysuria: Secondary | ICD-10-CM

## 2016-10-29 LAB — POC URINALSYSI DIPSTICK (AUTOMATED)
BILIRUBIN UA: NEGATIVE
GLUCOSE UA: NEGATIVE
Ketones, UA: NEGATIVE
NITRITE UA: POSITIVE
Spec Grav, UA: 1.015
Urobilinogen, UA: 1
pH, UA: 5.5

## 2016-10-29 MED ORDER — CIPROFLOXACIN HCL 250 MG PO TABS: 250.0000 mg | ORAL_TABLET | Freq: Two times a day (BID) | ORAL | 0 refills | Status: DC

## 2016-10-29 NOTE — Telephone Encounter (Signed)
Pt was seen on 11-30 for bladder inf and still having symptoms. Pt would like abx send to [facility] [address]

## 2016-10-29 NOTE — Progress Notes (Signed)
Pt with persistent urinary symptoms, burning, frequency and urgency after course macrobid per lab. No fevers, flank pain, vomiting, abd pain. Udip c/w persistent infection. Opted to treat with cipro. Culture pending.

## 2016-10-29 NOTE — Telephone Encounter (Signed)
I called the pt and informed her of the message below and she stated she would prefer to have the tests done.  Lab appt scheduled for today at 3:45pm and she is aware the lab will inform Dr Maudie Mercury of the results.

## 2016-10-29 NOTE — Addendum Note (Signed)
Addended by: Agnes Lawrence on: 10/29/2016 12:05 PM   Modules accepted: Orders

## 2016-10-29 NOTE — Telephone Encounter (Signed)
Can she stop in for a lab visit for udip and culture? Or appt? Thanks.

## 2016-11-01 LAB — URINE CULTURE

## 2016-11-08 ENCOUNTER — Telehealth: Payer: Self-pay | Admitting: Family Medicine

## 2016-11-08 DIAGNOSIS — R300 Dysuria: Secondary | ICD-10-CM

## 2016-11-08 NOTE — Telephone Encounter (Signed)
Patient called back and I informed her per Dr Maudie Mercury she can see her for an appt tomorrow to check for a yeast infection OR she can take an over the counter yeast medication and have the urine checked.  Patient stated she would prefer not to take a yeast medication if she does not need this and an appt was scheduled to see Dr Maudie Mercury at [facility].

## 2016-11-08 NOTE — Telephone Encounter (Signed)
Pt would like to know if you would like for her to come in to do more lab work or to come in to see you.  Pt state that she has completed the last round of antibiotics and she still have a little bit of burning.    Pharm:  [facility]

## 2016-11-08 NOTE — Telephone Encounter (Signed)
Per Dr. Maudie Mercury she would like for pt to have another urine test.  Pt state that she thinks that she may have a yeast infection and want to know if Dr. Maudie Mercury will be able to see her on 11/08/16?     Pt is scheduled for lab appointment 11/09/16

## 2016-11-08 NOTE — Telephone Encounter (Signed)
I left a message for the pt to return my call. 

## 2016-11-08 NOTE — Telephone Encounter (Signed)
Happy to see her as planned on 12/22 and can check for yeast.

## 2016-11-09 ENCOUNTER — Ambulatory Visit (INDEPENDENT_AMBULATORY_CARE_PROVIDER_SITE_OTHER): Payer: Commercial Managed Care - HMO | Admitting: Family Medicine

## 2016-11-09 ENCOUNTER — Other Ambulatory Visit (HOSPITAL_COMMUNITY)
Admission: RE | Admit: 2016-11-09 | Discharge: 2016-11-09 | Disposition: A | Discharge status: Home / Self Care | Payer: Commercial Managed Care - HMO | Source: Ambulatory Visit | Attending: Family Medicine | Admitting: Family Medicine

## 2016-11-09 ENCOUNTER — Encounter: Payer: Self-pay | Admitting: Family Medicine

## 2016-11-09 ENCOUNTER — Other Ambulatory Visit: Payer: Commercial Managed Care - HMO

## 2016-11-09 VITALS — BP 118/80 | HR 76 | Temp 98.6°F | Ht 67.0 in | Wt 161.2 lb

## 2016-11-09 DIAGNOSIS — L292 Pruritus vulvae: Secondary | ICD-10-CM

## 2016-11-09 DIAGNOSIS — R300 Dysuria: Secondary | ICD-10-CM | POA: Diagnosis not present

## 2016-11-09 LAB — POC URINALSYSI DIPSTICK (AUTOMATED)
Bilirubin, UA: NEGATIVE
Blood, UA: NEGATIVE
Glucose, UA: NEGATIVE
KETONES UA: NEGATIVE
LEUKOCYTES UA: NEGATIVE
NITRITE UA: NEGATIVE
PH UA: 5
PROTEIN UA: NEGATIVE
Spec Grav, UA: >=1.03
UROBILINOGEN UA: 0.2

## 2016-11-09 NOTE — Patient Instructions (Signed)
Use an over-the-counter yeast treatment as we discussed.  We have ordered labs or studies at this visit. It can take up to 1-2 weeks for results and processing. IF results require follow up or explanation, we will call you with instructions. Clinically stable results will be released to your [facility]. If you have not heard from [place] or cannot find your results in [facility] in 2 weeks please contact our office at [phone].  If you are not yet signed up for [facility], please consider signing up.

## 2016-11-09 NOTE — Progress Notes (Signed)
Pre visit review using our clinic review tool, if applicable. No additional management support is needed unless otherwise documented below in the visit note. 

## 2016-11-09 NOTE — Progress Notes (Signed)
HPI:  Krista Armstrong is a pleasant 65 year old here for an acute visit for vulvovaginal pruritus. This started yesterday. She recently was on antibiotics for a UTI. She had a little burning of the skin in this area when urinating. Denies fevers, malaise, urinary frequency or urgency, abdominal or pelvic pain, significant vaginal discharge, fevers or flank pain, hematuria. She was recently married.  ROS: See pertinent positives and negatives per HPI.  Past Medical History:  Diagnosis Date  . 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 Chronic back pain   . Colon polyps   . Family history of breast cancer   . GERD (gastroesophageal reflux disease)   . Hot flashes   . Hot flashes   . Hypertension   . Radiation 07/07/15-08/09/15   Left breast  . Ruptured lumbar disc     Past Surgical History:  Procedure Laterality Date  . BACK SURGERY  2000, 2009  . BACK SURGERY    . colon polyps    . RADIOACTIVE SEED GUIDED MASTECTOMY WITH AXILLARY SENTINEL LYMPH NODE BIOPSY Left 05/30/2015   Procedure: LEFT PARTIAL MASTECTOMY WITH RADIOACTIVE SEED LOCALIZATION, LEFT AXILLARY SENTINEL LYMPH NODE BIOPSY;  Surgeon: Fanny Skates, MD;  Location: [place];  Service: General;  Laterality: Left;  . ruptured lumbar disc    . STOMACH SURGERY  2006   tumors excised    Family History  Problem Relation Age of Onset  . Breast cancer Mother 76  . Stroke Father   . Heart disease Maternal Grandmother   . Colon cancer Neg Hx   . Colon polyps Neg Hx   . Diabetes Neg Hx   . Kidney disease Neg Hx   . Esophageal cancer Neg Hx   . Gallbladder disease Neg Hx   . Cervical cancer Maternal Aunt     Social History   Social History  . Marital status: Married    Spouse name: N/A  . Number of children: 3  . Years of education: N/A   Occupational History  . [place] History Main Topics  . Smoking status: Never Smoker  . Smokeless tobacco: Never  Used  . Alcohol use 4.2 oz/week    7 Glasses of wine per week     Comment: wine every  night   . Drug use: No  . Sexual activity: No   Other Topics Concern  . None   Social History Narrative  . None     Current Outpatient Prescriptions:  .  amLODipine (NORVASC) 5 MG tablet, Take 1 tablet (5 mg total) by mouth daily., Disp: 90 tablet, Rfl: 3 .  Biotin 1 MG CAPS, Take by mouth., Disp: , Rfl:  .  esomeprazole (NEXIUM) 40 MG capsule, Take 1 capsule (40 mg total) by mouth daily., Disp: 90 capsule, Rfl: 1 .  gabapentin (NEURONTIN) 300 MG capsule, Take 2 capsules (600 mg total) by mouth at bedtime., Disp: 180 capsule, Rfl: 4 .  losartan (COZAAR) 50 MG tablet, Take 1 tablet (50 mg total) by mouth daily., Disp: 90 tablet, Rfl: 3 .  tamoxifen (NOLVADEX) 20 MG tablet, Take 1 tablet (20 mg total) by mouth daily., Disp: 90 tablet, Rfl: 4  EXAM:  Vitals:   11/09/16 0844  BP: 118/80  Pulse: 76  Temp: 98.6 F (37 C)    Body mass index is 25.25 kg/m.  GENERAL: vitals reviewed and listed above, alert, oriented, appears well hydrated and in no acute distress  HEENT: atraumatic, conjunttiva clear, no obvious abnormalities on inspection of external nose and ears  NECK: no obvious masses on inspection  LUNGS: clear to auscultation bilaterally, no wheezes, rales or rhonchi, good air movement  CV: HRRR, no peripheral edema  ABD: Soft, nontender to palpation  GU:  mild irritation of the labia, small amount of white thick discharge in the vaginal vault, no cervical motion tenderness  MS: moves all extremities without noticeable abnormality  PSYCH: pleasant and cooperative, no obvious depression or anxiety  ASSESSMENT AND PLAN:  Discussed the following assessment and plan:  Dysuria - Plan: POCT Urinalysis Dipstick (Automated)  Vulvovaginal pruritus  -Likely yeast vaginitis, opted to treat with over-the-counter topical applications -GC/Chlamydia, trichomoniasis, BV and yeast  testing pending -Patient advised to return or notify a doctor immediately if symptoms worsen or persist or new concerns arise.  Patient Instructions  Use an over-the-counter yeast treatment as we discussed.  We have ordered labs or studies at this visit. It can take up to 1-2 weeks for results and processing. IF results require follow up or explanation, we will call you with instructions. Clinically stable results will be released to your [facility]. If you have not heard from [place] or cannot find your results in [facility] in 2 weeks please contact our office at [phone].  If you are not yet signed up for [facility], please consider signing up.          Colin Benton R., DO

## 2016-11-09 NOTE — Addendum Note (Signed)
Addended by: Agnes Lawrence on: 11/09/2016 10:40 AM   Modules accepted: Orders

## 2016-11-13 LAB — CERVICOVAGINAL ANCILLARY ONLY
CHLAMYDIA, DNA PROBE: NEGATIVE
Neisseria Gonorrhea: NEGATIVE
Trichomonas: NEGATIVE

## 2016-11-16 LAB — CERVICOVAGINAL ANCILLARY ONLY
Bacterial vaginitis: NEGATIVE
Candida vaginitis: NEGATIVE

## 2016-11-22 ENCOUNTER — Ambulatory Visit (INDEPENDENT_AMBULATORY_CARE_PROVIDER_SITE_OTHER): Payer: Commercial Managed Care - HMO | Admitting: Family Medicine

## 2016-11-22 ENCOUNTER — Encounter: Payer: Self-pay | Admitting: Family Medicine

## 2016-11-22 VITALS — BP 116/78 | HR 83 | Temp 98.5°F | Ht 66.0 in | Wt 158.8 lb

## 2016-11-22 DIAGNOSIS — J0100 Acute maxillary sinusitis, unspecified: Secondary | ICD-10-CM | POA: Diagnosis not present

## 2016-11-22 DIAGNOSIS — C50512 Malignant neoplasm of lower-outer quadrant of left female breast: Secondary | ICD-10-CM | POA: Diagnosis not present

## 2016-11-22 DIAGNOSIS — Z0000 Encounter for general adult medical examination without abnormal findings: Secondary | ICD-10-CM | POA: Diagnosis not present

## 2016-11-22 DIAGNOSIS — I10 Essential (primary) hypertension: Secondary | ICD-10-CM | POA: Diagnosis not present

## 2016-11-22 DIAGNOSIS — E2839 Other primary ovarian failure: Secondary | ICD-10-CM

## 2016-11-22 DIAGNOSIS — K219 Gastro-esophageal reflux disease without esophagitis: Secondary | ICD-10-CM | POA: Insufficient documentation

## 2016-11-22 MED ORDER — BENZONATATE 100 MG PO CAPS: 100.0000 mg | ORAL_CAPSULE | Freq: Three times a day (TID) | ORAL | 0 refills | Status: DC | PRN

## 2016-11-22 MED ORDER — DOXYCYCLINE HYCLATE 100 MG PO CAPS: 100.0000 mg | ORAL_CAPSULE | Freq: Two times a day (BID) | ORAL | 0 refills | Status: DC

## 2016-11-22 NOTE — Patient Instructions (Addendum)
BEFORE YOU LEAVE: -labs -follow up: 3-4 months  Take the cough medication and antibiotic as instructed. Please follow up if any worsening or new concerns.  -We placed a referral for you as discussed for bone density testing. It usually takes about 1-2 weeks to process and schedule this referral. If you have not heard from [place] regarding this appointment in 2 weeks please contact our office.  We have ordered labs or studies at this visit. It can take up to 1-2 weeks for results and processing. IF results require follow up or explanation, we will call you with instructions. Clinically stable results will be released to your [facility]. If you have not heard from [place] or cannot find your results in [facility] in 2 weeks please contact our office at [phone].  If you are not yet signed up for [facility], please consider signing up.   We recommend the following healthy lifestyle for LIFE: 1) Small portions.   Tip: eat off of a salad plate instead of a dinner plate.  Tip: if you need more or a snack choose fruits, veggies and/or a handful of nuts or seeds.  2) Eat a healthy clean diet.  * Tip: Avoid (less then 1 serving per week): processed foods, sweets, sweetened drinks, white starches (rice, flour, bread, potatoes, pasta, etc), red meat, fast foods, butter  *Tip: CHOOSE instead   * 5-9 servings per day of fresh or frozen fruits and vegetables (but not corn, potatoes, bananas, canned or dried fruit)   *nuts and seeds, beans   *olives and olive oil   *small portions of lean meats such as fish and white chicken    *small portions of whole grains  3)Get at least 150 minutes of sweaty aerobic exercise per week.  4)Reduce stress - consider counseling, meditation and relaxation to balance other aspects of your life.

## 2016-11-22 NOTE — Progress Notes (Signed)
Pre visit review using our clinic review tool, if applicable. No additional management support is needed unless otherwise documented below in the visit note. 

## 2016-11-22 NOTE — Progress Notes (Signed)
HPI:  Here for CPE:  -Concerns and/or follow up today: none PMH HTN, GERD and Breast Ca. Sees he oncologist for for breast exams and mammograms. Reports she has been doing well. No chest pain, shortness of breath, swelling, acid reflux problems.  New issue of: Sinus congestion: -started 2-3 weeks ago and not improving -symptoms include thick sinus congestion, postnasal drip and cough -Denies fever, body aches, nausea vomiting or diarrhea, sinus pain or ear pain -she wants an antibiotic and a cough medication  -Diet: variety of foods, balance and well rounded  -Exercise: regular exercise  -Taking folic acid, vitamin D or calcium: taking vitamin D  -Diabetes and Dyslipidemia Screening:fasting for labs today  -Hx of HTN: no  -Vaccines: UTD  -pap history:up-to-date, neg with neg hpv 11/2015  -sexual activity: yes, female partner, no new partners  -wants STI testing (Hep C if born [birth year]): no, tested recently, except as wish to have HIV to her labs when offered  -FH breast, colon or ovarian ca: see FH Last mammogram: up-to-date Last colon cancer screening: up-to-date  DEXA (>/= 65):wishes to do bone density screening when turned 66  -Alcohol, Tobacco, drug use: see social history  Review of Systems - no fevers, unintentional weight loss, vision loss, hearing loss, chest pain, sob, hemoptysis, melena, hematochezia, hematuria, genital discharge, changing or concerning skin lesions, bleeding, bruising, loc, thoughts of self harm or SI  Past Medical History:  Diagnosis Date  . 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 Chronic back pain   . Colon polyps   . Family history of breast cancer   . GERD (gastroesophageal reflux disease)   . Hot flashes   . Hypertension   . Radiation 07/07/15-08/09/15   Left breast  . Ruptured lumbar disc     Past Surgical History:  Procedure Laterality Date  . BACK SURGERY  2000, 2009  . BACK SURGERY    . colon  polyps    . RADIOACTIVE SEED GUIDED MASTECTOMY WITH AXILLARY SENTINEL LYMPH NODE BIOPSY Left 05/30/2015   Procedure: LEFT PARTIAL MASTECTOMY WITH RADIOACTIVE SEED LOCALIZATION, LEFT AXILLARY SENTINEL LYMPH NODE BIOPSY;  Surgeon: Fanny Skates, MD;  Location: [place];  Service: General;  Laterality: Left;  . ruptured lumbar disc    . STOMACH SURGERY  2006   tumors excised    Family History  Problem Relation Age of Onset  . Breast cancer Mother 39  . Stroke Father   . Heart disease Maternal Grandmother   . Colon cancer Neg Hx   . Colon polyps Neg Hx   . Diabetes Neg Hx   . Kidney disease Neg Hx   . Esophageal cancer Neg Hx   . Gallbladder disease Neg Hx   . Cervical cancer Maternal Aunt     Social History   Social History  . Marital status: Married    Spouse name: N/A  . Number of children: 3  . Years of education: N/A   Occupational History  . [place] History Main Topics  . Smoking status: Never Smoker  . Smokeless tobacco: Never Used  . Alcohol use 4.2 oz/week    7 Glasses of wine per week     Comment: wine every  night   . Drug use: No  . Sexual activity: No   Other Topics Concern  . None   Social History Narrative  . None     Current Outpatient Prescriptions:  .  amLODipine (NORVASC) 5 MG tablet, Take 1 tablet (5 mg total) by mouth daily., Disp: 90 tablet, Rfl: 3 .  benzonatate (TESSALON PERLES) 100 MG capsule, Take 1 capsule (100 mg total) by mouth 3 (three) times daily as needed., Disp: 20 capsule, Rfl: 0 .  Biotin 1 MG CAPS, Take by mouth., Disp: , Rfl:  .  doxycycline (VIBRAMYCIN) 100 MG capsule, Take 1 capsule (100 mg total) by mouth 2 (two) times daily., Disp: 20 capsule, Rfl: 0 .  esomeprazole (NEXIUM) 40 MG capsule, Take 1 capsule (40 mg total) by mouth daily., Disp: 90 capsule, Rfl: 1 .  gabapentin (NEURONTIN) 300 MG capsule, Take 2 capsules (600 mg total) by mouth at bedtime., Disp: 180 capsule, Rfl: 4 .  losartan  (COZAAR) 50 MG tablet, Take 1 tablet (50 mg total) by mouth daily., Disp: 90 tablet, Rfl: 3 .  tamoxifen (NOLVADEX) 20 MG tablet, Take 1 tablet (20 mg total) by mouth daily., Disp: 90 tablet, Rfl: 4  EXAM:  Vitals:   11/22/16 1500  BP: 116/78  Pulse: 83  Temp: 98.5 F (36.9 C)    GENERAL: vitals reviewed and listed below, alert, oriented, appears well hydrated and in no acute distress  HEENT: head atraumatic, PERRLA, normal appearance of eyes, ears, nose and mouth. moist mucus membranes, normal appearance of ear canals and TMs, thick nasal congestion, mild post oropharyngeal erythema with PND, no tonsillar edema or exudate, no sinus TTP  NECK: supple, no masses or lymphadenopathy  LUNGS: clear to auscultation bilaterally, no rales, rhonchi or wheeze  CV: HRRR, no peripheral edema or cyanosis, normal pedal pulses  ABDOMEN: bowel sounds normal, soft, non tender to palpation, no masses, no rebound or guarding  SKIN: no rash or abnormal lesions  GU/BREAST: declined, done recently and does breast exam with oncology  MS: normal gait, moves all extremities normally  NEURO: normal gait, speech and thought processing grossly intact, muscle tone grossly intact throughout  PSYCH: normal affect, pleasant and cooperative  ASSESSMENT AND PLAN:  Discussed the following assessment and plan:   Malignant neoplasm of lower-outer quadrant of left female breast, unspecified estrogen receptor status ([place]) -sees oncologist for breast exams and management  Acute non-recurrent maxillary sinusitis -new problem, abx and cough medication sent -discussed risks treatment, potential complications, return precuations  Estrogen deficiency - Plan: DG Bone Density  Encounter for preventive health examination - Plan: Lipid Panel, Hemoglobin [id number], Basic metabolic panel, CBC  -Discussed and advised all [place] preventive services health task force level A and B recommendations for age, sex and  risks.  -Advised at least 150 minutes of exercise per week and a healthy diet with avoidance of (less then 1 serving per week) processed foods, white starches, red meat, fast foods and sweets and consisting of: * 5-9 servings of fresh fruits and vegetables (not corn or potatoes) *nuts and seeds, beans *olives and olive oil *lean meats such as fish and white chicken  *whole grains  -FASTING labs, studies and vaccines per orders this encounter  Orders Placed This Encounter  Procedures  . DG Bone Density    Standing Status:   Future    Standing Expiration Date:   01/20/2018    Order Specific Question:   Reason for Exam (SYMPTOM  OR DIAGNOSIS REQUIRED)    Answer:   estrogen def    Order Specific Question:   Preferred imaging location?    Answer:   [facility]  . Lipid Panel  . Hemoglobin A1C  . Basic  metabolic panel  . CBC    Patient advised to return to clinic immediately if symptoms worsen or persist or new concerns.  Patient Instructions  BEFORE YOU LEAVE: -labs -follow up: 3-4 months  Take the cough medication and antibiotic as instructed. Please follow up if any worsening or new concerns.  -We placed a referral for you as discussed for bone density testing. It usually takes about 1-2 weeks to process and schedule this referral. If you have not heard from [place] regarding this appointment in 2 weeks please contact our office.  We have ordered labs or studies at this visit. It can take up to 1-2 weeks for results and processing. IF results require follow up or explanation, we will call you with instructions. Clinically stable results will be released to your [facility]. If you have not heard from [place] or cannot find your results in [facility] in 2 weeks please contact our office at [phone].  If you are not yet signed up for [facility], please consider signing up.   We recommend the following healthy lifestyle for LIFE: 1) Small portions.   Tip: eat off of a salad plate instead of a  dinner plate.  Tip: if you need more or a snack choose fruits, veggies and/or a handful of nuts or seeds.  2) Eat a healthy clean diet.  * Tip: Avoid (less then 1 serving per week): processed foods, sweets, sweetened drinks, white starches (rice, flour, bread, potatoes, pasta, etc), red meat, fast foods, butter  *Tip: CHOOSE instead   * 5-9 servings per day of fresh or frozen fruits and vegetables (but not corn, potatoes, bananas, canned or dried fruit)   *nuts and seeds, beans   *olives and olive oil   *small portions of lean meats such as fish and white chicken    *small portions of whole grains  3)Get at least 150 minutes of sweaty aerobic exercise per week.  4)Reduce stress - consider counseling, meditation and relaxation to balance other aspects of your life.           No Follow-up on file.  Colin Benton R., DO

## 2016-11-23 ENCOUNTER — Telehealth: Payer: Self-pay | Admitting: *Deleted

## 2016-11-23 ENCOUNTER — Other Ambulatory Visit: Payer: Self-pay | Admitting: Family Medicine

## 2016-11-23 LAB — BASIC METABOLIC PANEL
BUN: 16 mg/dL (ref 6–23)
CALCIUM: 9.2 mg/dL (ref 8.4–10.5)
CO2: 25 meq/L (ref 19–32)
CREATININE: 0.63 mg/dL (ref 0.40–1.20)
Chloride: 103 mEq/L (ref 96–112)
GFR: 122.05 mL/min (ref >60.00)
GLUCOSE: 79 mg/dL (ref 70–99)
Potassium: 3.9 mEq/L (ref 3.5–5.1)
Sodium: 139 mEq/L (ref 135–145)

## 2016-11-23 LAB — CBC
HCT: 36.5 % (ref 36.0–46.0)
Hemoglobin: 12.2 g/dL (ref 12.0–15.0)
MCHC: 33.5 g/dL (ref 30.0–36.0)
MCV: 88.5 fl (ref 78.0–100.0)
Platelets: 265 10*3/uL (ref 150.0–400.0)
RBC: 4.12 Mil/uL (ref 3.87–5.11)
RDW: 14 % (ref 11.5–15.5)
WBC: 5.9 10*3/uL (ref 4.0–10.5)

## 2016-11-23 LAB — LIPID PANEL
CHOLESTEROL: 136 mg/dL (ref 0–200)
HDL: 53.1 mg/dL (ref >39.00)
LDL Cholesterol: 58 mg/dL (ref 0–99)
NonHDL: 82.85
Total CHOL/HDL Ratio: 3
Triglycerides: 124 mg/dL (ref 0.0–149.0)
VLDL: 24.8 mg/dL (ref 0.0–40.0)

## 2016-11-23 LAB — HEMOGLOBIN A1C: HEMOGLOBIN A1C: 5.6 % (ref 4.6–6.5)

## 2016-11-23 MED ORDER — AMOXICILLIN-POT CLAVULANATE 875-125 MG PO TABS: 1.0000 | ORAL_TABLET | Freq: Two times a day (BID) | ORAL | 0 refills | Status: DC

## 2016-11-23 NOTE — Telephone Encounter (Signed)
I called the pt and informed her of the message below

## 2016-11-23 NOTE — Telephone Encounter (Signed)
Wow! That seems like a lot for doxy! I sent a different abx (augmentin)....hopefully should be cheap!

## 2016-11-23 NOTE — Telephone Encounter (Signed)
I called the pt to discuss her test results and she stated the Rx for Doxycycline that was sent to her pharmacy yesterday costs $75 and she cannot pay this.  Requests a different medication.  Message sent to Dr Maudie Mercury.

## 2016-12-25 ENCOUNTER — Ambulatory Visit (INDEPENDENT_AMBULATORY_CARE_PROVIDER_SITE_OTHER): Payer: Commercial Managed Care - HMO | Admitting: Family Medicine

## 2016-12-25 ENCOUNTER — Encounter: Payer: Self-pay | Admitting: Family Medicine

## 2016-12-25 VITALS — BP 118/80 | HR 79 | Temp 98.8°F | Ht 66.0 in | Wt 161.0 lb

## 2016-12-25 DIAGNOSIS — B379 Candidiasis, unspecified: Secondary | ICD-10-CM

## 2016-12-25 MED ORDER — FLUCONAZOLE 150 MG PO TABS: 150.0000 mg | ORAL_TABLET | Freq: Once | ORAL | 1 refills | Status: AC

## 2016-12-25 NOTE — Progress Notes (Signed)
HPI:  Acute visit for "yeast infection": -1 week ago -vulvovag pruritis and yeasty discharge -on abx from dentist and this started after starting the abx - will be on abx for another 2 weeks -no pelvic or abd pain, fevers, dysuria, nausea, vomiting -she tried OTC yeast treatment which did not work -she and her husband are frustrated about this  ROS: See pertinent positives and negatives per HPI.  Past Medical History:  Diagnosis Date  . 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 Chronic back pain   . Colon polyps   . Family history of breast cancer   . GERD (gastroesophageal reflux disease)   . Hot flashes   . Hypertension   . Radiation 07/07/15-08/09/15   Left breast  . Ruptured lumbar disc     Past Surgical History:  Procedure Laterality Date  . BACK SURGERY  2000, 2009  . BACK SURGERY    . colon polyps    . RADIOACTIVE SEED GUIDED MASTECTOMY WITH AXILLARY SENTINEL LYMPH NODE BIOPSY Left 05/30/2015   Procedure: LEFT PARTIAL MASTECTOMY WITH RADIOACTIVE SEED LOCALIZATION, LEFT AXILLARY SENTINEL LYMPH NODE BIOPSY;  Surgeon: Fanny Skates, MD;  Location: [place];  Service: General;  Laterality: Left;  . ruptured lumbar disc    . STOMACH SURGERY  2006   tumors excised    Family History  Problem Relation Age of Onset  . Breast cancer Mother 5  . Stroke Father   . Heart disease Maternal Grandmother   . Colon cancer Neg Hx   . Colon polyps Neg Hx   . Diabetes Neg Hx   . Kidney disease Neg Hx   . Esophageal cancer Neg Hx   . Gallbladder disease Neg Hx   . Cervical cancer Maternal Aunt     Social History   Social History  . Marital status: Married    Spouse name: N/A  . Number of children: 3  . Years of education: N/A   Occupational History  . [place] History Main Topics  . Smoking status: Never Smoker  . Smokeless tobacco: Never Used  . Alcohol use 4.2 oz/week    7 Glasses of wine per week     Comment: wine every  night   . Drug use: No  . Sexual activity: No   Other Topics Concern  . None   Social History Narrative  . None     Current Outpatient Prescriptions:  .  amLODipine (NORVASC) 5 MG tablet, TAKE 1 TABLET (5 MG TOTAL) BY MOUTH DAILY., Disp: 90 tablet, Rfl: 3 .  Biotin 1 MG CAPS, Take by mouth., Disp: , Rfl:  .  esomeprazole (NEXIUM) 40 MG capsule, Take 1 capsule (40 mg total) by mouth daily., Disp: 90 capsule, Rfl: 1 .  gabapentin (NEURONTIN) 300 MG capsule, Take 2 capsules (600 mg total) by mouth at bedtime., Disp: 180 capsule, Rfl: 4 .  losartan (COZAAR) 50 MG tablet, TAKE 1 TABLET (50 MG TOTAL) BY MOUTH DAILY. *MAX DAY SUPPLY PER INSURANCE*, Disp: 90 tablet, Rfl: 3 .  tamoxifen (NOLVADEX) 20 MG tablet, Take 1 tablet (20 mg total) by mouth daily., Disp: 90 tablet, Rfl: 4 .  fluconazole (DIFLUCAN) 150 MG tablet, Take 1 tablet (150 mg total) by mouth once., Disp: 2 tablet, Rfl: 1  EXAM:  Vitals:   12/25/16 1559  BP: 118/80  Pulse: 79  Temp: 98.8 F (37.1 C)    Body mass index is 25.99  kg/m.  GENERAL: vitals reviewed and listed above, alert, oriented, appears well hydrated and in no acute distress  HEENT: atraumatic, conjunttiva clear, no obvious abnormalities on inspection of external nose and ears  NECK: no obvious masses on inspection  LUNGS: clear to auscultation bilaterally, no wheezes, rales or rhonchi, good air movement  CV: HRRR, no peripheral edema  GU: declined  MS: moves all extremities without noticeable abnormality  PSYCH: pleasant and cooperative, no obvious depression or anxiety  ASSESSMENT AND PLAN:  Discussed the following assessment and plan:  Yeast infection  -pelvic exam declined, she feels strongly this is yeast and opted for oral tx -discussed potential for recurrent issue son abx -advised follow up if persistent or recurrent or new symtoms -Patient advised to return or notify a doctor immediately if symptoms worsen  or persist or new concerns arise.  Patient Instructions  Diflucan once now and in three days if still having symptoms.  Refill available if still having symptoms when done with antibiotic.  Probiotic - make sure to take at least 4 hours away from antibiotic dosing.  Eat a low sugar healthy diet.  Follow up if symptoms persist or worsen.   Colin Benton R., DO

## 2016-12-25 NOTE — Patient Instructions (Signed)
Diflucan once now and in three days if still having symptoms.  Refill available if still having symptoms when done with antibiotic.  Probiotic - make sure to take at least 4 hours away from antibiotic dosing.  Eat a low sugar healthy diet.  Follow up if symptoms persist or worsen.

## 2016-12-25 NOTE — Progress Notes (Signed)
Pre visit review using our clinic review tool, if applicable. No additional management support is needed unless otherwise documented below in the visit note. 

## 2017-01-04 ENCOUNTER — Telehealth: Payer: Self-pay | Admitting: Family Medicine

## 2017-01-04 NOTE — Telephone Encounter (Signed)
Discussed with Wendie Simmer - she will call pt.

## 2017-01-04 NOTE — Telephone Encounter (Signed)
I called the pt and she denies any heavy bleeding, otherwise Dr Maudie Mercury recommended she be seen here.  Per Dr Maudie Mercury I gave the phone numbers for [facility] and [facility] to call for an appt as she should be seen there within a month and to call our office if they cannot see her and she agreed.

## 2017-01-04 NOTE — Telephone Encounter (Signed)
Pt state that she is spotting (dark blood) and want to know if she should be concerned would like to have a call back.

## 2017-01-07 ENCOUNTER — Other Ambulatory Visit: Payer: Self-pay | Admitting: Family Medicine

## 2017-01-07 DIAGNOSIS — Z853 Personal history of malignant neoplasm of breast: Secondary | ICD-10-CM

## 2017-01-08 ENCOUNTER — Ambulatory Visit (INDEPENDENT_AMBULATORY_CARE_PROVIDER_SITE_OTHER): Payer: Commercial Managed Care - HMO | Admitting: Obstetrics and Gynecology

## 2017-01-08 ENCOUNTER — Encounter: Payer: Self-pay | Admitting: Obstetrics and Gynecology

## 2017-01-08 VITALS — BP 120/60 | HR 80 | Resp 16 | Ht 66.25 in | Wt 160.0 lb

## 2017-01-08 DIAGNOSIS — N763 Subacute and chronic vulvitis: Secondary | ICD-10-CM | POA: Diagnosis not present

## 2017-01-08 DIAGNOSIS — N941 Unspecified dyspareunia: Secondary | ICD-10-CM

## 2017-01-08 DIAGNOSIS — Z124 Encounter for screening for malignant neoplasm of cervix: Secondary | ICD-10-CM

## 2017-01-08 DIAGNOSIS — Z9229 Personal history of other drug therapy: Secondary | ICD-10-CM

## 2017-01-08 DIAGNOSIS — Z9221 Personal history of antineoplastic chemotherapy: Secondary | ICD-10-CM | POA: Diagnosis not present

## 2017-01-08 DIAGNOSIS — N950 Postmenopausal bleeding: Secondary | ICD-10-CM

## 2017-01-08 DIAGNOSIS — Z853 Personal history of malignant neoplasm of breast: Secondary | ICD-10-CM | POA: Diagnosis not present

## 2017-01-08 DIAGNOSIS — N719 Inflammatory disease of uterus, unspecified: Secondary | ICD-10-CM | POA: Diagnosis not present

## 2017-01-08 MED ORDER — DOXYCYCLINE HYCLATE 100 MG PO CAPS: 100.0000 mg | ORAL_CAPSULE | Freq: Two times a day (BID) | ORAL | 0 refills | Status: DC

## 2017-01-08 MED ORDER — FLUCONAZOLE 150 MG PO TABS: ORAL_TABLET | ORAL | 0 refills | Status: DC

## 2017-01-08 NOTE — Patient Instructions (Signed)

## 2017-01-08 NOTE — Progress Notes (Signed)
66 y.o. [id number] MarriedAfrican AmericanF here c/o postmenopause spotting.   She is recently married. She is sexually active, does have pain with intercourse. Initially the pain with intercourse was mild irritation. Then she got a bladder infection and then a yeast infection. Then had antibiotics for a tooth infection and got another yeast infection. It was treated with diflucan, symptoms are mostly gone, still with mild itching.  She started having a mild dark vaginal d/c for the last 2 weeks, just seemed to have improved. She has had some intermittent, mild pelvic cramping.  No fevers.  H/O breast cancer on Tamoxifen.    No LMP recorded. Patient is postmenopausal.          Sexually active: Yes.    The current method of family planning is post menopausal status.    Exercising: Yes.    walking Smoker:  no  Health Maintenance: Pap:  12-12-15 WNL NEG HR HPV History of abnormal Pap:  No MMG:  04-18-16 recommended DX MMG in 1 yr Colonoscopy:  03-04-15 WNL  BMD:   Never TDaP:  08-18-12 Gardasil: N/A   reports that she has never smoked. She has never used smokeless tobacco. She reports that she drinks about 4.2 oz of alcohol per week . She reports that she does not use drugs.  Past Medical History:  Diagnosis Date  . Abnormal uterine bleeding   . Breast cancer ([facility]) 05/03/15   Left Breast -Invasive ductal carcinoma  . Chronic back pain   . Colon polyps   . Family history of breast cancer   . GERD (gastroesophageal reflux disease)   . Hot flashes   . Hypertension   . Radiation 07/07/15-08/09/15   Left breast  . Ruptured lumbar disc     Past Surgical History:  Procedure Laterality Date  . BACK SURGERY  2000, 2009  . BACK SURGERY    . colon polyps    . RADIOACTIVE SEED GUIDED MASTECTOMY WITH AXILLARY SENTINEL LYMPH NODE BIOPSY Left 05/30/2015   Procedure: LEFT PARTIAL MASTECTOMY WITH RADIOACTIVE SEED LOCALIZATION, LEFT AXILLARY SENTINEL LYMPH NODE BIOPSY;  Surgeon: Fanny Skates, MD;   Location: [facility];  Service: General;  Laterality: Left;  . ruptured lumbar disc    . STOMACH SURGERY  2006   tumors excised    Current Outpatient Prescriptions  Medication Sig Dispense Refill  . amLODipine (NORVASC) 5 MG tablet TAKE 1 TABLET (5 MG TOTAL) BY MOUTH DAILY. 90 tablet 3  . Biotin 1 MG CAPS Take by mouth.    . esomeprazole (NEXIUM) 40 MG capsule Take 1 capsule (40 mg total) by mouth daily. 90 capsule 1  . gabapentin (NEURONTIN) 300 MG capsule Take 2 capsules (600 mg total) by mouth at bedtime. 180 capsule 4  . losartan (COZAAR) 50 MG tablet TAKE 1 TABLET (50 MG TOTAL) BY MOUTH DAILY. *MAX DAY SUPPLY PER INSURANCE* 90 tablet 3  . tamoxifen (NOLVADEX) 20 MG tablet Take 1 tablet (20 mg total) by mouth daily. 90 tablet 4   No current facility-administered medications for this visit.     Family History  Problem Relation Age of Onset  . Breast cancer Mother 35  . Stroke Father   . Heart disease Maternal Grandmother   . Cervical cancer Maternal Aunt   . Colon cancer Neg Hx   . Colon polyps Neg Hx   . Diabetes Neg Hx   . Kidney disease Neg Hx   . Esophageal cancer Neg Hx   . Gallbladder disease  Neg Hx     Review of Systems  Constitutional: Negative.   HENT: Negative.   Eyes: Negative.   Respiratory: Negative.   Cardiovascular: Negative.   Gastrointestinal: Negative.   Endocrine: Negative.   Genitourinary: Negative.        Postmenopause spotting   Musculoskeletal: Negative.   Skin: Negative.   Allergic/Immunologic: Negative.   Neurological: Negative.   Psychiatric/Behavioral: Negative.     Exam:   BP 120/60 (BP Location: Right Arm, Patient Position: Sitting, Cuff Size: Normal)   Pulse 80   Resp 16   Ht 5' 6.25" (1.683 m)   Wt 160 lb (72.6 kg)   BMI 25.63 kg/m   Weight change: @WEIGHTCHANGE @ Height:   Height: 5' 6.25" (168.3 cm)  Ht Readings from Last 3 Encounters:  01/08/17 5' 6.25" (1.683 m)  12/25/16 5\' 6"  (1.676 m)  11/22/16 5\' 6"   (1.676 m)    General appearance: alert, cooperative and appears stated age Head: Normocephalic, without obvious abnormality, atraumatic Neck: no adenopathy, supple, symmetrical, trachea midline and thyroid normal to inspection and palpation Lungs: clear to auscultation bilaterally Cardiovascular: regular rate and rhythm Heart: regular rate and rhythm Abdomen: soft, mildly tender to palpation in the suprapubic region and LLQ, no rebound, no guardingl; no masses,  no organomegaly Extremities: extremities normal, atraumatic, no cyanosis or edema Skin: Skin color, texture, turgor normal. No rashes or lesions Lymph nodes: Cervical, supraclavicular, and axillary nodes normal. No abnormal inguinal nodes palpated Neurologic: Grossly normal   Pelvic: External genitalia:  no lesions, minimal erythema              Urethra:  normal appearing urethra with no masses, tenderness or lesions              Bartholins and Skenes: normal                 Vagina: normal appearing mildly atrophic vagina with normal color and discharge, no lesions              Cervix: no lesions               Bimanual Exam:  Uterus:  anteverted, very tender, enlarged 8-10 week sized, mobile              Adnexa: no mass, fullness, tenderness               Rectovaginal: Confirms               Anus:  normal sphincter tone, no lesions  The risks of endometrial biopsy were reviewed and a consent was obtained.  A speculum was placed in the vagina and the cervix was cleansed with betadine. The mini-pipelle was placed into the endometrial cavity. The uterus sounded to 7-8 cm. The endometrial biopsy was performed, minimal tissue was obtained. The speculum was removed. There were no complications.    Chaperone was present for exam.  A:  Postmenopausal bleeding  H/O breast cancer  On tamoxifen  Suspected endometritis, she had a recent negative genprobe with her primary  Repeated yeast infections after antibiotic treatment, still  with mild irritation  H/O mild entry dyspareunia  P:   Endometrial biopsy done  Pap with hpv  Wet prep probe  Will treat with doxycyline for presumed endometritis  F/U for an ultrasound, possible sonohysterogram  Discussed use of lubricants when sexually active

## 2017-01-09 LAB — WET PREP BY MOLECULAR PROBE
CANDIDA SPECIES: NEGATIVE
Gardnerella vaginalis: NEGATIVE
Trichomonas vaginosis: NEGATIVE

## 2017-01-11 LAB — IPS PAP TEST WITH HPV

## 2017-01-22 ENCOUNTER — Ambulatory Visit (INDEPENDENT_AMBULATORY_CARE_PROVIDER_SITE_OTHER): Payer: Commercial Managed Care - HMO | Admitting: Obstetrics and Gynecology

## 2017-01-22 ENCOUNTER — Ambulatory Visit (INDEPENDENT_AMBULATORY_CARE_PROVIDER_SITE_OTHER): Payer: Commercial Managed Care - HMO

## 2017-01-22 ENCOUNTER — Encounter: Payer: Self-pay | Admitting: Obstetrics and Gynecology

## 2017-01-22 ENCOUNTER — Other Ambulatory Visit: Payer: Self-pay | Admitting: Obstetrics and Gynecology

## 2017-01-22 VITALS — BP 102/60 | HR 80 | Resp 14 | Wt 164.0 lb

## 2017-01-22 DIAGNOSIS — Z9229 Personal history of other drug therapy: Secondary | ICD-10-CM

## 2017-01-22 DIAGNOSIS — N950 Postmenopausal bleeding: Secondary | ICD-10-CM

## 2017-01-22 DIAGNOSIS — Z79810 Long term (current) use of selective estrogen receptor modulators (SERMs): Secondary | ICD-10-CM | POA: Diagnosis not present

## 2017-01-22 DIAGNOSIS — Z853 Personal history of malignant neoplasm of breast: Secondary | ICD-10-CM

## 2017-01-22 DIAGNOSIS — Z9221 Personal history of antineoplastic chemotherapy: Secondary | ICD-10-CM

## 2017-01-22 DIAGNOSIS — N840 Polyp of corpus uteri: Secondary | ICD-10-CM | POA: Diagnosis not present

## 2017-01-22 NOTE — Progress Notes (Signed)
GYNECOLOGY  VISIT   HPI: 66 y.o.   Married  [place] American  female   [phone] with No LMP recorded. Patient is postmenopausal.   here for follow up PMB. She has a h/o breast cancer and is on Tamoxifen. She was seen on 01/08/17 and had a benign endometrial biopsy.    GYNECOLOGIC HISTORY: No LMP recorded. Patient is postmenopausal. Contraception:postmenopause  Menopausal hormone therapy: none         OB History    Gravida Para Term Preterm AB Living   3 3 3     3    SAB TAB Ectopic Multiple Live Births           3         Patient Active Problem List   Diagnosis Date Noted  . Essential hypertension 11/22/2016  . Gastroesophageal reflux disease 11/22/2016  . Genetic testing 09/06/2015  . Family history of breast cancer   . Breast cancer of lower-outer quadrant of left female breast ([place]) 05/06/2015    Past Medical History:  Diagnosis Date  . Abnormal uterine bleeding   . Breast cancer ([facility]) 05/03/15   Left Breast -Invasive ductal carcinoma  . Chronic back pain   . Colon polyps   . Family history of breast cancer   . GERD (gastroesophageal reflux disease)   . Hot flashes   . Hypertension   . Radiation 07/07/15-08/09/15   Left breast  . Ruptured lumbar disc     Past Surgical History:  Procedure Laterality Date  . BACK SURGERY  2000, 2009  . BACK SURGERY    . colon polyps    . RADIOACTIVE SEED GUIDED MASTECTOMY WITH AXILLARY SENTINEL LYMPH NODE BIOPSY Left 05/30/2015   Procedure: LEFT PARTIAL MASTECTOMY WITH RADIOACTIVE SEED LOCALIZATION, LEFT AXILLARY SENTINEL LYMPH NODE BIOPSY;  Surgeon: Fanny Skates, MD;  Location: [facility];  Service: General;  Laterality: Left;  . ruptured lumbar disc    . STOMACH SURGERY  2006   tumors excised    Current Outpatient Prescriptions  Medication Sig Dispense Refill  . amLODipine (NORVASC) 5 MG tablet TAKE 1 TABLET (5 MG TOTAL) BY MOUTH DAILY. 90 tablet 3  . Biotin 1 MG CAPS Take by mouth.    . doxycycline  (VIBRAMYCIN) 100 MG capsule Take 1 capsule (100 mg total) by mouth 2 (two) times daily. Take BID for 14 days.  Take with food as can cause GI distress. 28 capsule 0  . esomeprazole (NEXIUM) 40 MG capsule Take 1 capsule (40 mg total) by mouth daily. 90 capsule 1  . gabapentin (NEURONTIN) 300 MG capsule Take 2 capsules (600 mg total) by mouth at bedtime. 180 capsule 4  . losartan (COZAAR) 50 MG tablet TAKE 1 TABLET (50 MG TOTAL) BY MOUTH DAILY. *MAX DAY SUPPLY PER INSURANCE* 90 tablet 3  . tamoxifen (NOLVADEX) 20 MG tablet Take 1 tablet (20 mg total) by mouth daily. 90 tablet 4   No current facility-administered medications for this visit.      ALLERGIES: Vicodin [hydrocodone-acetaminophen]  Family History  Problem Relation Age of Onset  . Breast cancer Mother 33  . Stroke Father   . Heart disease Maternal Grandmother   . Cervical cancer Maternal Aunt   . Colon cancer Neg Hx   . Colon polyps Neg Hx   . Diabetes Neg Hx   . Kidney disease Neg Hx   . Esophageal cancer Neg Hx   . Gallbladder disease Neg Hx  Social History   Social History  . Marital status: Married    Spouse name: N/A  . Number of children: 3  . Years of education: N/A   Occupational History  . [place] History Main Topics  . Smoking status: Never Smoker  . Smokeless tobacco: Never Used  . Alcohol use 4.2 oz/week    7 Glasses of wine per week     Comment: wine every  night   . Drug use: No  . Sexual activity: Yes    Partners: Male    Birth control/ protection: Post-menopausal   Other Topics Concern  . Not on file   Social History Narrative  . No narrative on file  Husband is a truck driver   Review of Systems  Constitutional: Negative.   HENT: Negative.   Eyes: Negative.   Respiratory: Negative.   Cardiovascular: Negative.   Gastrointestinal: Negative.   Genitourinary: Negative.   Musculoskeletal: Negative.   Skin: Negative.   Neurological: Negative.   Endo/Heme/Allergies:  Negative.   Psychiatric/Behavioral: Negative.     PHYSICAL EXAMINATION:    BP 102/60 (BP Location: Right Arm, Patient Position: Sitting, Cuff Size: Normal)   Pulse 80   Resp 14   Wt 164 lb (74.4 kg)   BMI 26.27 kg/m     General appearance: alert, cooperative and appears stated age Neck: no adenopathy, supple, symmetrical, trachea midline and thyroid normal to inspection and palpation Abdomen: soft, non-tender; bowel sounds normal; no masses,  no organomegaly  Pelvic: External genitalia:  no lesions              Urethra:  normal appearing urethra with no masses, tenderness or lesions              Bartholins and Skenes: normal                 Vagina: normal appearing vagina with normal color and discharge, no lesions              Cervix: no lesions              Sonohysterogram The procedure and risks of the procedure were reviewed with the patient, consent form was signed. A speculum was placed in the vagina and the cervix was cleansed with betadine. The sonohysterogram catheter was inserted into the uterine cavity without difficulty. Saline was infused under direct observation with the ultrasound. There was a large endometrial polyp noted coming off of the anterior endometrium.The catheter was removed.    Chaperone was present for exam.  Ultrasound/sonohysterogram images were reviewed with the patient  ASSESSMENT Postmenopausal bleeding on tamoxifen Large endometrial polyp    PLAN Plan: hysteroscopy, polypectomy, dilation and curettage. Reviewed risks, including: bleeding, infection, uterine perforation    An After Visit Summary was printed and given to the patient.  25 minutes face to face time of which over 50% was spent in counseling.   CC: Colin Benton, DO

## 2017-01-25 ENCOUNTER — Telehealth: Payer: Self-pay | Admitting: Obstetrics and Gynecology

## 2017-01-25 NOTE — Telephone Encounter (Signed)
Called patient to review benefits for a recommended surgical procedure. Left Voicemail requesting a call back.

## 2017-01-29 ENCOUNTER — Telehealth: Payer: Self-pay | Admitting: Oncology

## 2017-01-29 NOTE — H&P (Signed)
GYNECOLOGY  VISIT   HPI: 66 y.o.   Married  [place] American  female   [phone] with No LMP recorded. Patient is postmenopausal.   here for follow up PMB. She has a h/o breast cancer and is on Tamoxifen. She was seen on 01/08/17 and had a benign endometrial biopsy.    GYNECOLOGIC HISTORY: No LMP recorded. Patient is postmenopausal. Contraception:postmenopause  Menopausal hormone therapy: none                 OB History    Gravida Para Term Preterm AB Living   3 3 3     3    SAB TAB Ectopic Multiple Live Births           3             Patient Active Problem List   Diagnosis Date Noted  . Essential hypertension 11/22/2016  . Gastroesophageal reflux disease 11/22/2016  . Genetic testing 09/06/2015  . Family history of breast cancer   . Breast cancer of lower-outer quadrant of left female breast ([facility]) 05/06/2015        Past Medical History:  Diagnosis Date  . Abnormal uterine bleeding   . Breast cancer ([facility]) 05/03/15   Left Breast -Invasive ductal carcinoma  . Chronic back pain   . Colon polyps   . Family history of breast cancer   . GERD (gastroesophageal reflux disease)   . Hot flashes   . Hypertension   . Radiation 07/07/15-08/09/15   Left breast  . Ruptured lumbar disc          Past Surgical History:  Procedure Laterality Date  . BACK SURGERY  2000, 2009  . BACK SURGERY    . colon polyps    . RADIOACTIVE SEED GUIDED MASTECTOMY WITH AXILLARY SENTINEL LYMPH NODE BIOPSY Left 05/30/2015   Procedure: LEFT PARTIAL MASTECTOMY WITH RADIOACTIVE SEED LOCALIZATION, LEFT AXILLARY SENTINEL LYMPH NODE BIOPSY;  Surgeon: Fanny Skates, MD;  Location: [facility];  Service: General;  Laterality: Left;  . ruptured lumbar disc    . STOMACH SURGERY  2006   tumors excised          Current Outpatient Prescriptions  Medication Sig Dispense Refill  . amLODipine (NORVASC) 5 MG tablet TAKE 1 TABLET (5 MG TOTAL) BY MOUTH DAILY. 90  tablet 3  . Biotin 1 MG CAPS Take by mouth.    . doxycycline (VIBRAMYCIN) 100 MG capsule Take 1 capsule (100 mg total) by mouth 2 (two) times daily. Take BID for 14 days.  Take with food as can cause GI distress. 28 capsule 0  . esomeprazole (NEXIUM) 40 MG capsule Take 1 capsule (40 mg total) by mouth daily. 90 capsule 1  . gabapentin (NEURONTIN) 300 MG capsule Take 2 capsules (600 mg total) by mouth at bedtime. 180 capsule 4  . losartan (COZAAR) 50 MG tablet TAKE 1 TABLET (50 MG TOTAL) BY MOUTH DAILY. *MAX DAY SUPPLY PER INSURANCE* 90 tablet 3  . tamoxifen (NOLVADEX) 20 MG tablet Take 1 tablet (20 mg total) by mouth daily. 90 tablet 4   No current facility-administered medications for this visit.      ALLERGIES: Vicodin [hydrocodone-acetaminophen]       Family History  Problem Relation Age of Onset  . Breast cancer Mother 22  . Stroke Father   . Heart disease Maternal Grandmother   . Cervical cancer Maternal Aunt   . Colon cancer Neg Hx   . Colon polyps Neg Hx   .  Diabetes Neg Hx   . Kidney disease Neg Hx   . Esophageal cancer Neg Hx   . Gallbladder disease Neg Hx     Social History        Social History  . Marital status: Married    Spouse name: N/A  . Number of children: 3  . Years of education: N/A       Occupational History  . [place] History Main Topics  . Smoking status: Never Smoker  . Smokeless tobacco: Never Used  . Alcohol use 4.2 oz/week    7 Glasses of wine per week     Comment: wine every  night   . Drug use: No  . Sexual activity: Yes    Partners: Male    Birth control/ protection: Post-menopausal       Other Topics Concern  . Not on file      Social History Narrative  . No narrative on file  Husband is a truck driver   Review of Systems  Constitutional: Negative.   HENT: Negative.   Eyes: Negative.   Respiratory: Negative.   Cardiovascular: Negative.   Gastrointestinal:  Negative.   Genitourinary: Negative.   Musculoskeletal: Negative.   Skin: Negative.   Neurological: Negative.   Endo/Heme/Allergies: Negative.   Psychiatric/Behavioral: Negative.     PHYSICAL EXAMINATION:    BP 102/60 (BP Location: Right Arm, Patient Position: Sitting, Cuff Size: Normal)   Pulse 80   Resp 14   Wt 164 lb (74.4 kg)   BMI 26.27 kg/m     General appearance: alert, cooperative and appears stated age Neck: no adenopathy, supple, symmetrical, trachea midline and thyroid normal to inspection and palpation Abdomen: soft, non-tender; bowel sounds normal; no masses,  no organomegaly  Pelvic: External genitalia:  no lesions              Urethra:  normal appearing urethra with no masses, tenderness or lesions              Bartholins and Skenes: normal                 Vagina: normal appearing vagina with normal color and discharge, no lesions              Cervix: no lesions              Sonohysterogram The procedure and risks of the procedure were reviewed with the patient, consent form was signed. A speculum was placed in the vagina and the cervix was cleansed with betadine. The sonohysterogram catheter was inserted into the uterine cavity without difficulty. Saline was infused under direct observation with the ultrasound. There was a large endometrial polyp noted coming off of the anterior endometrium.The catheter was removed.    Chaperone was present for exam.  Ultrasound/sonohysterogram images were reviewed with the patient  ASSESSMENT Postmenopausal bleeding on tamoxifen Large endometrial polyp    PLAN Plan: hysteroscopy, polypectomy, dilation and curettage. Reviewed risks, including: bleeding, infection, uterine perforation    An After Visit Summary was printed and given to the patient.  25 minutes face to face time of which over 50% was spent in counseling.

## 2017-01-29 NOTE — Telephone Encounter (Signed)
Patient came in to get copy of genetic report. Had patient sign HIM ROI Auth form and give copy of ID which was scanned into Epic.

## 2017-01-30 ENCOUNTER — Encounter (HOSPITAL_BASED_OUTPATIENT_CLINIC_OR_DEPARTMENT_OTHER): Payer: Self-pay | Admitting: *Deleted

## 2017-01-30 NOTE — Progress Notes (Signed)
Pt instructed npo pmn 3/19 x norvasc, losartan, nexium, tamoxifen w sip of water.  To [facility] 3/20 @ 0600.  Needs istat on arrival.

## 2017-01-31 NOTE — Telephone Encounter (Signed)
Spoke with patient regarding benefit for surgery.  See account notes for further details.

## 2017-02-04 ENCOUNTER — Telehealth: Payer: Self-pay | Admitting: Obstetrics and Gynecology

## 2017-02-05 ENCOUNTER — Encounter (HOSPITAL_BASED_OUTPATIENT_CLINIC_OR_DEPARTMENT_OTHER)
Admission: RE | Disposition: A | Discharge status: Home / Self Care | Payer: Self-pay | Source: Ambulatory Visit | Attending: Obstetrics and Gynecology

## 2017-02-05 ENCOUNTER — Ambulatory Visit (HOSPITAL_BASED_OUTPATIENT_CLINIC_OR_DEPARTMENT_OTHER): Payer: Commercial Managed Care - HMO | Admitting: Anesthesiology

## 2017-02-05 ENCOUNTER — Ambulatory Visit (HOSPITAL_BASED_OUTPATIENT_CLINIC_OR_DEPARTMENT_OTHER)
Admission: RE | Admit: 2017-02-05 | Discharge: 2017-02-05 | Disposition: A | Discharge status: Home / Self Care | Payer: Commercial Managed Care - HMO | Source: Ambulatory Visit | Attending: Obstetrics and Gynecology | Admitting: Obstetrics and Gynecology

## 2017-02-05 ENCOUNTER — Encounter (HOSPITAL_BASED_OUTPATIENT_CLINIC_OR_DEPARTMENT_OTHER): Payer: Self-pay | Admitting: *Deleted

## 2017-02-05 DIAGNOSIS — Z79899 Other long term (current) drug therapy: Secondary | ICD-10-CM | POA: Insufficient documentation

## 2017-02-05 DIAGNOSIS — I10 Essential (primary) hypertension: Secondary | ICD-10-CM | POA: Insufficient documentation

## 2017-02-05 DIAGNOSIS — N950 Postmenopausal bleeding: Secondary | ICD-10-CM | POA: Diagnosis not present

## 2017-02-05 DIAGNOSIS — N840 Polyp of corpus uteri: Secondary | ICD-10-CM | POA: Insufficient documentation

## 2017-02-05 DIAGNOSIS — Z853 Personal history of malignant neoplasm of breast: Secondary | ICD-10-CM | POA: Insufficient documentation

## 2017-02-05 DIAGNOSIS — Z79810 Long term (current) use of selective estrogen receptor modulators (SERMs): Secondary | ICD-10-CM | POA: Diagnosis not present

## 2017-02-05 DIAGNOSIS — K219 Gastro-esophageal reflux disease without esophagitis: Secondary | ICD-10-CM | POA: Diagnosis not present

## 2017-02-05 HISTORY — PX: DILATATION & CURETTAGE/HYSTEROSCOPY WITH MYOSURE: SHX6511

## 2017-02-05 HISTORY — DX: Presence of spectacles and contact lenses: Z97.3

## 2017-02-05 LAB — POCT I-STAT, CHEM 8
BUN: 12 mg/dL (ref 6–20)
CALCIUM ION: 1.19 mmol/L (ref 1.15–1.40)
CREATININE: 0.6 mg/dL (ref 0.44–1.00)
Chloride: 104 mmol/L (ref 101–111)
GLUCOSE: 123 mg/dL — AB (ref 65–99)
HCT: 35 % — ABNORMAL LOW (ref 36.0–46.0)
Hemoglobin: 11.9 g/dL — ABNORMAL LOW (ref 12.0–15.0)
Potassium: 3.5 mmol/L (ref 3.5–5.1)
Sodium: 142 mmol/L (ref 135–145)
TCO2: 23 mmol/L (ref 0–100)

## 2017-02-05 SURGERY — DILATATION & CURETTAGE/HYSTEROSCOPY WITH MYOSURE
Anesthesia: General | Site: Uterus

## 2017-02-05 MED ORDER — FENTANYL CITRATE (PF) 100 MCG/2ML IJ SOLN
INTRAMUSCULAR | Status: AC
  Filled 2017-02-05: qty 2

## 2017-02-05 MED ORDER — MEPERIDINE HCL 25 MG/ML IJ SOLN
6.2500 mg | INTRAMUSCULAR | Status: DC | PRN
  Filled 2017-02-05: qty 1

## 2017-02-05 MED ORDER — EPHEDRINE 5 MG/ML INJ
INTRAVENOUS | Status: AC
  Filled 2017-02-05: qty 10

## 2017-02-05 MED ORDER — LACTATED RINGERS IV SOLN
INTRAVENOUS | Status: DC
  Administered 2017-02-05: 07:00:00 via INTRAVENOUS
  Filled 2017-02-05: qty 1000

## 2017-02-05 MED ORDER — ONDANSETRON HCL 4 MG/2ML IJ SOLN
INTRAMUSCULAR | Status: AC
  Filled 2017-02-05: qty 2

## 2017-02-05 MED ORDER — SODIUM CHLORIDE 0.9 % IV SOLN: INTRAVENOUS | Status: DC | PRN

## 2017-02-05 MED ORDER — OXYCODONE HCL 5 MG PO TABS
5.0000 mg | ORAL_TABLET | Freq: Once | ORAL | Status: AC | PRN
  Administered 2017-02-05: 5 mg via ORAL
  Filled 2017-02-05: qty 1

## 2017-02-05 MED ORDER — EPHEDRINE SULFATE-NACL 50-0.9 MG/10ML-% IV SOSY
PREFILLED_SYRINGE | INTRAVENOUS | Status: DC | PRN
  Administered 2017-02-05 (×2): 10 mg via INTRAVENOUS

## 2017-02-05 MED ORDER — PROMETHAZINE HCL 25 MG/ML IJ SOLN
6.2500 mg | INTRAMUSCULAR | Status: DC | PRN
  Filled 2017-02-05: qty 1

## 2017-02-05 MED ORDER — PROPOFOL 10 MG/ML IV BOLUS
INTRAVENOUS | Status: AC
  Filled 2017-02-05: qty 20

## 2017-02-05 MED ORDER — DEXAMETHASONE SODIUM PHOSPHATE 4 MG/ML IJ SOLN
INTRAMUSCULAR | Status: DC | PRN
  Administered 2017-02-05: 10 mg via INTRAVENOUS

## 2017-02-05 MED ORDER — DEXAMETHASONE SODIUM PHOSPHATE 10 MG/ML IJ SOLN
INTRAMUSCULAR | Status: AC
  Filled 2017-02-05: qty 1

## 2017-02-05 MED ORDER — LIDOCAINE 2% (20 MG/ML) 5 ML SYRINGE
INTRAMUSCULAR | Status: AC
  Filled 2017-02-05: qty 5

## 2017-02-05 MED ORDER — MIDAZOLAM HCL 2 MG/2ML IJ SOLN
INTRAMUSCULAR | Status: AC
  Filled 2017-02-05: qty 2

## 2017-02-05 MED ORDER — PROPOFOL 10 MG/ML IV BOLUS
INTRAVENOUS | Status: DC | PRN
  Administered 2017-02-05: 180 mg via INTRAVENOUS

## 2017-02-05 MED ORDER — SODIUM CHLORIDE 0.9 % IR SOLN
Status: DC | PRN
  Administered 2017-02-05: 9000 mL

## 2017-02-05 MED ORDER — OXYCODONE HCL 5 MG PO TABS
ORAL_TABLET | ORAL | Status: AC
  Filled 2017-02-05: qty 1

## 2017-02-05 MED ORDER — HYDROMORPHONE HCL 1 MG/ML IJ SOLN
0.2500 mg | INTRAMUSCULAR | Status: DC | PRN
  Filled 2017-02-05: qty 0.5

## 2017-02-05 MED ORDER — MIDAZOLAM HCL 5 MG/5ML IJ SOLN
INTRAMUSCULAR | Status: DC | PRN
  Administered 2017-02-05: 2 mg via INTRAVENOUS

## 2017-02-05 MED ORDER — LIDOCAINE 2% (20 MG/ML) 5 ML SYRINGE
INTRAMUSCULAR | Status: DC | PRN
Start: 2017-02-05 — End: 2017-02-05
  Administered 2017-02-05: 60 mg via INTRAVENOUS

## 2017-02-05 MED ORDER — KETOROLAC TROMETHAMINE 30 MG/ML IJ SOLN
INTRAMUSCULAR | Status: AC
  Filled 2017-02-05: qty 1

## 2017-02-05 MED ORDER — FENTANYL CITRATE (PF) 100 MCG/2ML IJ SOLN
INTRAMUSCULAR | Status: DC | PRN
  Administered 2017-02-05: 50 ug via INTRAVENOUS
  Administered 2017-02-05 (×2): 25 ug via INTRAVENOUS

## 2017-02-05 MED ORDER — ONDANSETRON HCL 4 MG/2ML IJ SOLN
INTRAMUSCULAR | Status: DC | PRN
  Administered 2017-02-05: 4 mg via INTRAVENOUS

## 2017-02-05 MED ORDER — KETOROLAC TROMETHAMINE 30 MG/ML IJ SOLN
INTRAMUSCULAR | Status: DC | PRN
  Administered 2017-02-05: 30 mg via INTRAVENOUS

## 2017-02-05 MED ORDER — LACTATED RINGERS IV SOLN
INTRAVENOUS | Status: DC
  Filled 2017-02-05: qty 1000

## 2017-02-05 MED ORDER — OXYCODONE HCL 5 MG/5ML PO SOLN
5.0000 mg | Freq: Once | ORAL | Status: AC | PRN
  Filled 2017-02-05: qty 5

## 2017-02-05 SURGICAL SUPPLY — 35 items
BIPOLAR CUTTING LOOP 21FR (ELECTRODE)
CANISTER SUCT 3000ML PPV (MISCELLANEOUS) ×6 IMPLANT
CATH ROBINSON RED A/P 16FR (CATHETERS) IMPLANT
COVER BACK TABLE 60X90IN (DRAPES) ×3 IMPLANT
DEVICE MYOSURE LITE (MISCELLANEOUS) ×3 IMPLANT
DEVICE MYOSURE REACH (MISCELLANEOUS) IMPLANT
DILATOR CANAL MILEX (MISCELLANEOUS) IMPLANT
DRAPE HYSTEROSCOPY (DRAPE) ×3 IMPLANT
DRAPE LG THREE QUARTER DISP (DRAPES) ×3 IMPLANT
DRSG TELFA 3X8 NADH (GAUZE/BANDAGES/DRESSINGS) ×3 IMPLANT
ELECT REM PT RETURN 9FT ADLT (ELECTROSURGICAL)
ELECTRODE REM PT RTRN 9FT ADLT (ELECTROSURGICAL) IMPLANT
FILTER ARTHROSCOPY CONVERTOR (FILTER) ×3 IMPLANT
GLOVE BIO SURGEON STRL SZ 6.5 (GLOVE) ×2 IMPLANT
GLOVE BIO SURGEONS STRL SZ 6.5 (GLOVE) ×1
GOWN STRL REUS W/ TWL LRG LVL3 (GOWN DISPOSABLE) ×1 IMPLANT
GOWN STRL REUS W/TWL LRG LVL3 (GOWN DISPOSABLE) ×2
IV NS IRRIG 3000ML ARTHROMATIC (IV SOLUTION) ×3 IMPLANT
KIT RM TURNOVER CYSTO AR (KITS) ×3 IMPLANT
LEGGING LITHOTOMY PAIR STRL (DRAPES) ×3 IMPLANT
LOOP CUTTING BIPOLAR 21FR (ELECTRODE) IMPLANT
MYOSURE XL FIBROID REM (MISCELLANEOUS) ×3
NEEDLE SPNL 22GX3.5 QUINCKE BK (NEEDLE) IMPLANT
PACK BASIN DAY SURGERY FS (CUSTOM PROCEDURE TRAY) ×3 IMPLANT
PAD OB MATERNITY 4.3X12.25 (PERSONAL CARE ITEMS) ×3 IMPLANT
PAD PREP 24X48 CUFFED NSTRL (MISCELLANEOUS) ×3 IMPLANT
SEAL ROD LENS SCOPE MYOSURE (ABLATOR) ×3 IMPLANT
SYR 20CC LL (SYRINGE) IMPLANT
SYR CONTROL 10ML LL (SYRINGE) IMPLANT
SYSTEM TISS REMOVAL MYSR XL RM (MISCELLANEOUS) ×1 IMPLANT
TOWEL OR 17X24 6PK STRL BLUE (TOWEL DISPOSABLE) ×6 IMPLANT
TRAY DSU PREP LF (CUSTOM PROCEDURE TRAY) ×3 IMPLANT
TUBING AQUILEX INFLOW (TUBING) ×3 IMPLANT
TUBING AQUILEX OUTFLOW (TUBING) ×3 IMPLANT
WATER STERILE IRR 500ML POUR (IV SOLUTION) IMPLANT

## 2017-02-05 NOTE — Anesthesia Preprocedure Evaluation (Signed)
Anesthesia Evaluation  Patient identified by MRN, date of birth, ID band Patient awake    Reviewed: Allergy & Precautions, NPO status , Patient's Chart, lab work & pertinent test results  Airway Mallampati: II  TM Distance: >3 FB Neck ROM: Full    Dental no notable dental hx.    Pulmonary neg pulmonary ROS,    Pulmonary exam normal breath sounds clear to auscultation       Cardiovascular hypertension, Normal cardiovascular exam Rhythm:Regular Rate:Normal     Neuro/Psych negative neurological ROS  negative psych ROS   GI/Hepatic Neg liver ROS, GERD  Medicated,  Endo/Other  negative endocrine ROS  Renal/GU negative Renal ROS  negative genitourinary   Musculoskeletal negative musculoskeletal ROS (+)   Abdominal   Peds negative pediatric ROS (+)  Hematology negative hematology ROS (+)   Anesthesia Other Findings   Reproductive/Obstetrics negative OB ROS                             Anesthesia Physical  Anesthesia Plan  ASA: II  Anesthesia Plan: General   Post-op Pain Management:    Induction: Intravenous  Airway Management Planned: LMA  Additional Equipment:   Intra-op Plan:   Post-operative Plan: Extubation in OR  Informed Consent: I have reviewed the patients History and Physical, chart, labs and discussed the procedure including the risks, benefits and alternatives for the proposed anesthesia with the patient or authorized representative who has indicated his/her understanding and acceptance.   Dental advisory given  Plan Discussed with: CRNA and Surgeon  Anesthesia Plan Comments:         Anesthesia Quick Evaluation

## 2017-02-05 NOTE — Discharge Instructions (Signed)
°  Post Anesthesia Home Care Instructions ° °Activity: °Get plenty of rest for the remainder of the day. A responsible individual must stay with you for 24 hours following the procedure.  °For the next 24 hours, DO NOT: °-Drive a car °-Operate machinery °-Drink alcoholic beverages °-Take any medication unless instructed by your physician °-Make any legal decisions or sign important papers. ° °Meals: °Start with liquid foods such as gelatin or soup. Progress to regular foods as tolerated. Avoid greasy, spicy, heavy foods. If nausea and/or vomiting occur, drink only clear liquids until the nausea and/or vomiting subsides. Call your physician if vomiting continues. ° °Special Instructions/Symptoms: °Your throat may feel dry or sore from the anesthesia or the breathing tube placed in your throat during surgery. If this causes discomfort, gargle with warm salt water. The discomfort should disappear within 24 hours. ° °If you had a scopolamine patch placed behind your ear for the management of post- operative nausea and/or vomiting: ° °1. The medication in the patch is effective for 72 hours, after which it should be removed.  Wrap patch in a tissue and discard in the trash. Wash hands thoroughly with soap and water. °2. You may remove the patch earlier than 72 hours if you experience unpleasant side effects which may include dry mouth, dizziness or visual disturbances. °3. Avoid touching the patch. Wash your hands with soap and water after contact with the patch. °    ° °D & C Home care Instructions: ° ° °Personal hygiene:  Used sanitary napkins for vaginal drainage not tampons. Shower or tub bathe the day after your procedure. No douching until bleeding stops. Always wipe from front to back after  Elimination. ° °Activity: Do not drive or operate any equipment today. The effects of the anesthesia are still present and drowsiness may result. Rest today, not necessarily flat bed rest, just take it easy. You may resume your  normal activity in one to 2 days. ° °Sexual activity: No intercourse for one week or as indicated by your physician ° °Diet: Eat a light diet as desired this evening. You may resume a regular diet tomorrow. ° °Return to work: One to 2 days. ° °General Expectations of your surgery: Vaginal bleeding should be no heavier than a normal period. Spotting may continue up to 10 days. Mild cramps may continue for a couple of days. You may have a regular period in 2-6 weeks. ° °Unexpected observations call your doctor if these occur: persistent or heavy bleeding. Severe abdominal cramping or pain. Elevation of temperature greater than 100°F. ° °Call for an appointment in one week. ° ° ° °Patient's Signature_______________________________________________________ ° °Nurse's Signature________________________________________________________  °

## 2017-02-05 NOTE — Transfer of Care (Signed)
Immediate Anesthesia Transfer of Care Note  Patient: Jennife Hudler  Procedure(s) Performed: Procedure(s) (LRB): DILATATION & CURETTAGE/HYSTEROSCOPY WITH MYOSURE (N/A)  Patient Location: PACU  Anesthesia Type: General  Level of Consciousness: awake, oriented, sedated and patient cooperative  Airway & Oxygen Therapy: Patient Spontanous Breathing and Patient connected to face mask oxygen  Post-op Assessment: Report given to PACU RN and Post -op Vital signs reviewed and stable  Post vital signs: Reviewed and stable  Complications: No apparent anesthesia complications  Last Vitals:  Vitals:   02/05/17 0602 02/05/17 0824  BP: 122/62 (!) 115/101  Pulse: 77 (!) 101  Resp: 16 (!) 28  Temp: 37 C 36.6 C

## 2017-02-05 NOTE — Anesthesia Postprocedure Evaluation (Signed)
Anesthesia Post Note  Patient: Krista Armstrong  Procedure(s) Performed: Procedure(s) (LRB): DILATATION & CURETTAGE/HYSTEROSCOPY WITH MYOSURE (N/A)  Patient location during evaluation: PACU Anesthesia Type: General Level of consciousness: awake and alert Pain management: pain level controlled Vital Signs Assessment: post-procedure vital signs reviewed and stable Respiratory status: spontaneous breathing, nonlabored ventilation and respiratory function stable Cardiovascular status: blood pressure returned to baseline and stable Postop Assessment: no signs of nausea or vomiting Anesthetic complications: no       Last Vitals:  Vitals:   02/05/17 0845 02/05/17 0900  BP: 128/79 131/86  Pulse: 87 90  Resp: 13 17  Temp:      Last Pain:  Vitals:   02/05/17 0845  TempSrc:   PainSc: 0-No pain                 Lynda Rainwater

## 2017-02-05 NOTE — Interval H&P Note (Signed)
History and Physical Interval Note:  02/05/2017 7:12 AM  Krista Armstrong  has presented today for surgery, with the diagnosis of PMB, endometrial polyp  The various methods of treatment have been discussed with the patient and family. After consideration of risks, benefits and other options for treatment, the patient has consented to  Procedure(s): [place] (N/A) as a surgical intervention .  The patient's history has been reviewed, patient examined, no change in status, stable for surgery.  I have reviewed the patient's chart and labs.  Questions were answered to the patient's satisfaction.     [name]

## 2017-02-05 NOTE — Op Note (Signed)
Preoperative Diagnosis: Postmenopausal bleeding, endometrial mass, history of breast cancer, on tamoxifen  Postoperative Diagnosis: Same  Procedure: Hysteroscopy, removal of large endometrial mass, dilation and curettage  Surgeon: Dr Sumner Boast  Assistants: None  Anesthesia: General via LMA  EBL: 25 cc   Fluids: 300 cc  Fluid deficit: 850 cc  Urine output: not recorded  Indications for surgery: The patient is a 66 yo female, who presented with postmenopausal bleeding. She has a h/o breast cancer and is on Tamoxifen. Work up included a normal pap, a benign endometrial biopsy, sonohysterogram revealed a 8 x 4 cm cystic endometrial mass, looked c/w an endometrial polyp.  The risks of the surgery were reviewed with the patient and the consent form was signed prior to her surgery.  Findings: EUA: enlarged, anteverted uterus, no adnexal masses. Uterus sounded to 12 cm. Hysteroscopy: large intracavitary mass.   Specimens: endometrial mass, endocervical curettage, endometrial curettage   Procedure: The patient was taken to the operating room with an IV in place. She was placed in the dorsal lithotomy position and anesthesia was administered. She was prepped and draped in the usual sterile fashion for a vaginal procedure. She voided on the way to the OR. A weighted speculum was placed in the vagina and a single tooth tenaculum was placed on the anterior lip of the cervix. The cervix was dilated to a #21 pratt dilator. The uterus was sounded to 12 cm. The myosure hysteroscope was inserted into the uterine cavity. With continuous infusion of normal saline, the uterine cavity was visualized with the above findings. Initially the [facility] reach resectoscope was placed in the cavity and the mass was partially dissected. The mass was firmer than a typical polyp and resection was difficult. The myosure was then removed. The myosure XL was then placed in the uterine cavity (no further dilation needed).  The myosure XL resectoscope was then used to remove the remainder of the mass. The mass felt firm at the base (consistency of a myoma). An endocervical curettage was performed. The endometrial cavity was then curetted with the small sharp curette. The cavity had the characteristically gritty texture at the end of the procedure. The curette and the single tooth tenaculum were removed. Oozing from the tenaculum site was stopped with pressure. The speculum was removed. The patients perineum was cleansed of betadine and she was taken out of the dorsal lithotomy position.  Upon awakening the LMA was removed and the patient was transferred to the recovery room in stable and awake condition.  The sponge and instrument count were correct. There were no complications.   CC: Dr Colin Benton, Dr Lurline Del

## 2017-02-05 NOTE — Anesthesia Procedure Notes (Signed)
Procedure Name: LMA Insertion Date/Time: 02/05/2017 7:25 AM Performed by: Denna Haggard D Pre-anesthesia Checklist: Patient identified, Emergency Drugs available, Suction available and Patient being monitored Patient Re-evaluated:Patient Re-evaluated prior to inductionOxygen Delivery Method: Circle system utilized Preoxygenation: Pre-oxygenation with 100% oxygen Intubation Type: IV induction Ventilation: Mask ventilation without difficulty LMA: LMA inserted LMA Size: 4.0 Number of attempts: 1 Airway Equipment and Method: Bite block Placement Confirmation: positive ETCO2 Tube secured with: Tape Dental Injury: Teeth and Oropharynx as per pre-operative assessment

## 2017-02-05 NOTE — H&P (View-Only) (Signed)
Pt instructed npo pmn 3/19 x norvasc, losartan, nexium, tamoxifen w sip of water.  To [facility] 3/20 @ 0600.  Needs istat on arrival.

## 2017-02-06 ENCOUNTER — Encounter (HOSPITAL_BASED_OUTPATIENT_CLINIC_OR_DEPARTMENT_OTHER): Payer: Self-pay | Admitting: Obstetrics and Gynecology

## 2017-02-06 NOTE — Telephone Encounter (Signed)
error 

## 2017-02-07 ENCOUNTER — Other Ambulatory Visit: Payer: Self-pay | Admitting: Oncology

## 2017-02-11 ENCOUNTER — Ambulatory Visit (INDEPENDENT_AMBULATORY_CARE_PROVIDER_SITE_OTHER): Payer: Commercial Managed Care - HMO | Admitting: Family Medicine

## 2017-02-11 ENCOUNTER — Encounter: Payer: Self-pay | Admitting: Family Medicine

## 2017-02-11 VITALS — BP 120/80 | HR 87 | Temp 98.6°F | Ht 66.0 in | Wt 151.2 lb

## 2017-02-11 DIAGNOSIS — M542 Cervicalgia: Secondary | ICD-10-CM

## 2017-02-11 MED ORDER — CYCLOBENZAPRINE HCL 5 MG PO TABS: 5.0000 mg | ORAL_TABLET | Freq: Two times a day (BID) | ORAL | 0 refills | Status: DC | PRN

## 2017-02-11 NOTE — Progress Notes (Signed)
HPI:  Acute visit for neck pain: -intermittent for about 1 month -started without known injury or trauma -R sided post cervical muscles, mod to severe at times, sharp with certain movements -Advil and heat help some -denies: radiation, weakness, numbness, headaches, fevers, malaise  ROS: See pertinent positives and negatives per HPI.  Past Medical History:  Diagnosis Date  . Abnormal uterine bleeding   . 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 Chronic back pain   . Colon polyps   . Family history of breast cancer   . GERD (gastroesophageal reflux disease)   . Hot flashes   . Hypertension   . Radiation 07/07/15-08/09/15   Left breast  . Ruptured lumbar disc   . Transfusion history   . Wears glasses     Past Surgical History:  Procedure Laterality Date  . BACK SURGERY  2000, 2009  . BACK SURGERY    . BREAST SURGERY    . colon polyps    . DILATATION & CURETTAGE/HYSTEROSCOPY WITH MYOSURE N/A 02/05/2017   Procedure: DILATATION & CURETTAGE/HYSTEROSCOPY WITH MYOSURE;  Surgeon: [name], MD;  Location: [facility];  Service: Gynecology;  Laterality: N/A;  . MASTECTOMY    . RADIOACTIVE SEED GUIDED MASTECTOMY WITH AXILLARY SENTINEL LYMPH NODE BIOPSY Left 05/30/2015   Procedure: LEFT PARTIAL MASTECTOMY WITH RADIOACTIVE SEED LOCALIZATION, LEFT AXILLARY SENTINEL LYMPH NODE BIOPSY;  Surgeon: Fanny Skates, MD;  Location: [place];  Service: General;  Laterality: Left;  . ruptured lumbar disc    . STOMACH SURGERY  2006   tumors excised    Family History  Problem Relation Age of Onset  . Breast cancer Mother 17  . Stroke Father   . Heart disease Maternal Grandmother   . Cervical cancer Maternal Aunt   . Colon cancer Neg Hx   . Colon polyps Neg Hx   . Diabetes Neg Hx   . Kidney disease Neg Hx   . Esophageal cancer Neg Hx   . Gallbladder disease Neg Hx     Social History   Social History  . Marital  status: Married    Spouse name: N/A  . Number of children: 3  . Years of education: N/A   Occupational History  . [place] History Main Topics  . Smoking status: Never Smoker  . Smokeless tobacco: Never Used  . Alcohol use 4.2 oz/week    7 Glasses of wine per week     Comment: wine every  night   . Drug use: No  . Sexual activity: Yes    Partners: Male    Birth control/ protection: Post-menopausal   Other Topics Concern  . None   Social History Narrative  . None     Current Outpatient Prescriptions:  .  amLODipine (NORVASC) 5 MG tablet, TAKE 1 TABLET (5 MG TOTAL) BY MOUTH DAILY., Disp: 90 tablet, Rfl: 3 .  Biotin 1 MG CAPS, Take by mouth., Disp: , Rfl:  .  esomeprazole (NEXIUM) 40 MG capsule, Take 1 capsule (40 mg total) by mouth daily., Disp: 90 capsule, Rfl: 1 .  gabapentin (NEURONTIN) 300 MG capsule, Take 2 capsules (600 mg total) by mouth at bedtime., Disp: 180 capsule, Rfl: 4 .  losartan (COZAAR) 50 MG tablet, TAKE 1 TABLET (50 MG TOTAL) BY MOUTH DAILY. *MAX DAY SUPPLY PER INSURANCE*, Disp: 90 tablet, Rfl: 3 .  tamoxifen (NOLVADEX) 20 MG tablet, Take 1 tablet (20 mg  total) by mouth daily., Disp: 90 tablet, Rfl: 4 .  cyclobenzaprine (FLEXERIL) 5 MG tablet, Take 1 tablet (5 mg total) by mouth 2 (two) times daily as needed for muscle spasms., Disp: 20 tablet, Rfl: 0  EXAM:  Vitals:   02/11/17 1621  BP: 120/80  Pulse: 87  Temp: 98.6 F (37 C)    Body mass index is 24.4 kg/m.  GENERAL: vitals reviewed and listed above, alert, oriented, appears well hydrated and in no acute distress  HEENT: atraumatic, conjunttiva clear, no obvious abnormalities on inspection of external nose and ears  NECK: no obvious masses on inspection - se MS exam below  LUNGS: clear to auscultation bilaterally, no wheezes, rales or rhonchi, good air movement  CV: HRRR, no peripheral edema  MS: moves all extremities without noticeable abnormality, normal inspection head  and neck except for head forward posture, TTP in the R post cervical muscles diffusely and the suboccipital muscles, normal ROM with significant coaching, no bony TTP, normal strength/sensitivity to light touch/dtrs bilat UEs  PSYCH: pleasant and cooperative, no obvious depression or anxiety  ASSESSMENT AND PLAN:  Discussed the following assessment and plan:  Neck pain - Plan: DG Cervical Spine Complete  -we discussed possible serious and likely etiologies, workup and treatment, treatment risks and return precautions - query muscle spasm/OA neck vs cervical radiculopathy or other -after this discussion, Syriah opted for HEP, heat, topical sports creams, ibuprofen , muscle relaxer and imaging. Will start with plain films - imaging warranted given duration and severity reported symptoms and sig TTP on exam. May also need MRI pending results or if not improving. -follow up advised 2 weeks -of course, we advised Margarita  to return or notify a doctor immediately if symptoms worsen or persist or new concerns arise.   Patient Instructions  BEFORE YOU LEAVE: -neck spasm exercises -xray sheet -follow up: 2 weeks  Go get the xray.  Heat 15 minutes twice daily - try the rice sock.  Topical sports creams (available over the counter) as needed for pain along with the advil if needed per instructions.  Flexeril at night for spasm. Can also take in the morning if needed.  I hope you are feeling better soon! Seek care sooner if worsening, new concerns or you are not improving with treatment.      Colin Benton R., DO

## 2017-02-11 NOTE — Patient Instructions (Signed)
BEFORE YOU LEAVE: -neck spasm exercises -xray sheet -follow up: 2 weeks  Go get the xray.  Heat 15 minutes twice daily - try the rice sock.  Topical sports creams (available over the counter) as needed for pain along with the advil if needed per instructions.  Flexeril at night for spasm. Can also take in the morning if needed.  I hope you are feeling better soon! Seek care sooner if worsening, new concerns or you are not improving with treatment.

## 2017-02-11 NOTE — Progress Notes (Signed)
Pre visit review using our clinic review tool, if applicable. No additional management support is needed unless otherwise documented below in the visit note. 

## 2017-02-12 ENCOUNTER — Ambulatory Visit (INDEPENDENT_AMBULATORY_CARE_PROVIDER_SITE_OTHER): Payer: Commercial Managed Care - HMO | Admitting: Family Medicine

## 2017-02-12 ENCOUNTER — Encounter: Payer: Self-pay | Admitting: Family Medicine

## 2017-02-12 ENCOUNTER — Encounter: Payer: Self-pay | Admitting: *Deleted

## 2017-02-12 ENCOUNTER — Ambulatory Visit (INDEPENDENT_AMBULATORY_CARE_PROVIDER_SITE_OTHER)
Admission: RE | Admit: 2017-02-12 | Discharge: 2017-02-12 | Disposition: A | Discharge status: Home / Self Care | Payer: Commercial Managed Care - HMO | Source: Ambulatory Visit | Attending: Family Medicine | Admitting: Family Medicine

## 2017-02-12 VITALS — BP 118/82 | HR 79 | Temp 98.5°F | Ht 66.0 in | Wt 151.0 lb

## 2017-02-12 DIAGNOSIS — Z9889 Other specified postprocedural states: Secondary | ICD-10-CM

## 2017-02-12 DIAGNOSIS — M542 Cervicalgia: Secondary | ICD-10-CM

## 2017-02-12 DIAGNOSIS — M5030 Other cervical disc degeneration, unspecified cervical region: Secondary | ICD-10-CM | POA: Diagnosis not present

## 2017-02-12 MED ORDER — PREDNISONE 10 MG PO TABS
ORAL_TABLET | ORAL | 0 refills | Status: DC
Start: 2017-02-12 — End: 2017-05-11

## 2017-02-12 MED ORDER — TRAMADOL HCL 50 MG PO TABS: 50.0000 mg | ORAL_TABLET | Freq: Two times a day (BID) | ORAL | 0 refills | Status: DC | PRN

## 2017-02-12 NOTE — Patient Instructions (Signed)
BEFORE YOU LEAVE: -stat referrals -prescription -work note - seen today, out of work for next 3 days unless responding well to treatment and pending results of testing and specialist recommendations  Start the prednisone and follow instructions carefully.  Tramadol for severe pain - use cautiously.  -We placed a referral for you as discussed for the MRI and to the specialist. It usually takes about 1-2 weeks to process and schedule this referral. If you have not heard from [place] regarding this appointment in 2 weeks please contact our office.

## 2017-02-12 NOTE — Progress Notes (Addendum)
HPI:  Acute visit for Neck pain: -started 1 month ago -seen yesterday - seen notes -continued severe pain in R side of neck -flexeril and Nsaids, heat and topical menthol not helping -tried a Tylenol 3 she had at home (from prior dental work) and this helped -requesting pain medication and work note as report pain interferes w/ ability to work -no radiation, weakness, numbness, fevers, chills, malaise, HA -xray per report with DDD, OA of neck, "mild bony encroachment upon the neural foramina at multiple levels on the R due to uncovertebral and facet joint osteophytes" -hx prior lowe rback surgeries for DDD - in [place]  ROS: See pertinent positives and negatives per HPI.  Past Medical History:  Diagnosis Date  . Abnormal uterine bleeding   . 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 Chronic back pain   . Colon polyps   . Family history of breast cancer   . GERD (gastroesophageal reflux disease)   . Hot flashes   . Hypertension   . Radiation 07/07/15-08/09/15   Left breast  . Ruptured lumbar disc   . Transfusion history   . Wears glasses     Past Surgical History:  Procedure Laterality Date  . BACK SURGERY  2000, 2009  . BACK SURGERY    . BREAST SURGERY    . colon polyps    . DILATATION & CURETTAGE/HYSTEROSCOPY WITH MYOSURE N/A 02/05/2017   Procedure: DILATATION & CURETTAGE/HYSTEROSCOPY WITH MYOSURE;  Surgeon: [name], MD;  Location: [facility];  Service: Gynecology;  Laterality: N/A;  . MASTECTOMY    . RADIOACTIVE SEED GUIDED MASTECTOMY WITH AXILLARY SENTINEL LYMPH NODE BIOPSY Left 05/30/2015   Procedure: LEFT PARTIAL MASTECTOMY WITH RADIOACTIVE SEED LOCALIZATION, LEFT AXILLARY SENTINEL LYMPH NODE BIOPSY;  Surgeon: Fanny Skates, MD;  Location: [place];  Service: General;  Laterality: Left;  . ruptured lumbar disc    . STOMACH SURGERY  2006   tumors excised    Family History  Problem  Relation Age of Onset  . Breast cancer Mother 71  . Stroke Father   . Heart disease Maternal Grandmother   . Cervical cancer Maternal Aunt   . Colon cancer Neg Hx   . Colon polyps Neg Hx   . Diabetes Neg Hx   . Kidney disease Neg Hx   . Esophageal cancer Neg Hx   . Gallbladder disease Neg Hx     Social History   Social History  . Marital status: Married    Spouse name: N/A  . Number of children: 3  . Years of education: N/A   Occupational History  . [place] History Main Topics  . Smoking status: Never Smoker  . Smokeless tobacco: Never Used  . Alcohol use 4.2 oz/week    7 Glasses of wine per week     Comment: wine every  night   . Drug use: No  . Sexual activity: Yes    Partners: Male    Birth control/ protection: Post-menopausal   Other Topics Concern  . None   Social History Narrative  . None     Current Outpatient Prescriptions:  .  amLODipine (NORVASC) 5 MG tablet, TAKE 1 TABLET (5 MG TOTAL) BY MOUTH DAILY., Disp: 90 tablet, Rfl: 3 .  Biotin 1 MG CAPS, Take by mouth., Disp: , Rfl:  .  cyclobenzaprine (FLEXERIL) 5 MG tablet, Take 1 tablet (5 mg total) by mouth 2 (two)  times daily as needed for muscle spasms., Disp: 20 tablet, Rfl: 0 .  esomeprazole (NEXIUM) 40 MG capsule, Take 1 capsule (40 mg total) by mouth daily., Disp: 90 capsule, Rfl: 1 .  gabapentin (NEURONTIN) 300 MG capsule, Take 2 capsules (600 mg total) by mouth at bedtime., Disp: 180 capsule, Rfl: 4 .  losartan (COZAAR) 50 MG tablet, TAKE 1 TABLET (50 MG TOTAL) BY MOUTH DAILY. *MAX DAY SUPPLY PER INSURANCE*, Disp: 90 tablet, Rfl: 3 .  tamoxifen (NOLVADEX) 20 MG tablet, Take 1 tablet (20 mg total) by mouth daily., Disp: 90 tablet, Rfl: 4 .  predniSONE (DELTASONE) 10 MG tablet, 50mg  (5 tabs) x 2 days, 30mg  (3 tabs) x 2 days, 20mg  (2 tabs) x 2 days, 10mg  (1 tab) x 2 days, Disp: 22 tablet, Rfl: 0 .  traMADol (ULTRAM) 50 MG tablet, Take 1 tablet (50 mg total) by mouth every 12 (twelve)  hours as needed., Disp: 8 tablet, Rfl: 0  EXAM:  Vitals:   02/12/17 1040  BP: 118/82  Pulse: 79  Temp: 98.5 F (36.9 C)    Body mass index is 24.37 kg/m.  GENERAL: vitals reviewed and listed above, alert, oriented, appears well hydrated and in no acute distress  HEENT: atraumatic, conjunttiva clear, no obvious abnormalities on inspection of external nose and ears  NECK: no obvious masses on inspection, see below in MS exam and exam yesterday  LUNGS: clear to auscultation bilaterally, no wheezes, rales or rhonchi, good air movement  CV: HRRR, no peripheral edema  MS: moves all extremities without noticeable abnormality - see exam from yesterday, still with some TTP in the R paracervical and R suboccipital musculature though actually seems reduced today with less tension, ROM about the same and ok with coaxing in rotation and ext/flex, normal exam UEs bilat, no bony TTP  PSYCH: pleasant and cooperative, no obvious depression or anxiety  ASSESSMENT AND PLAN:  Discussed the following assessment and plan:  Neck pain - Plan: MR Cervical Spine Wo Contrast, Ambulatory referral to Orthopedic Surgery  History of back surgery  DDD (degenerative disc disease), cervical  -we discussed possible serious and likely etiologies, workup and treatment, treatment risks and return precautions -will get stat mri and have her see specialist given reported degree of pain, exam actually seems improved -will do prednisone as based on xray findings -discussed risks opiod pain medications, limitations in musculoskeletal pain, since nothing else working small amount (only a few days) of tramadol provided after extensive discussion risks and advised only to use as little as possible as infrequently as possible. She has listed allergy to vicodin - reports is NOT an allergy and is unsure why is on allergy list. -work not out today and up to 3 days until further eval or if feeling better with treatment and  pending specialists recommendations -Patient advised to return or notify a doctor immediately if symptoms worsen or persist or new concerns arise.  Patient Instructions  BEFORE YOU LEAVE: -stat referrals -prescription -work note - seen today, out of work for next 3 days unless responding well to treatment and pending results of testing and specialist recommendations  Start the prednisone and follow instructions carefully.  Tramadol for severe pain - use cautiously.  -We placed a referral for you as discussed for the MRI and to the specialist. It usually takes about 1-2 weeks to process and schedule this referral. If you have not heard from [place] regarding this appointment in 2 weeks please contact our office.  Colin Benton R., DO

## 2017-02-12 NOTE — Progress Notes (Signed)
Pre visit review using our clinic review tool, if applicable. No additional management support is needed unless otherwise documented below in the visit note. 

## 2017-02-13 ENCOUNTER — Telehealth: Payer: Self-pay | Admitting: Family Medicine

## 2017-02-13 ENCOUNTER — Ambulatory Visit
Admission: RE | Admit: 2017-02-13 | Discharge: 2017-02-13 | Disposition: A | Discharge status: Home / Self Care | Payer: Commercial Managed Care - HMO | Source: Ambulatory Visit | Attending: Family Medicine | Admitting: Family Medicine

## 2017-02-13 DIAGNOSIS — M542 Cervicalgia: Secondary | ICD-10-CM

## 2017-02-13 NOTE — Telephone Encounter (Signed)
I am so sorry to hear she is not feeling better. Please see when she is scheduled to see the specialist. She does have significant arthritis in the neck and likely pinched nerve confirmed on the MRI and needs to see the specialist. If appt is not soon, she should go to ortho walk in clinic or ER if pain is that severe and prednisone, tramadol and muscle relaxer are not helping.   If appt with specialist is in the next few days could give her small amount of the tylenol 3 to use for 3 days max since she felt it helped. 1/2 to 1 tablet every 6 hours as needed for severe pain. # 10. No refills. Do not use with the tramadol or other pain medications, alcohol, any anxiety or other sedating medications. I do not recommend this for more then a few days for this type of pain.

## 2017-02-13 NOTE — Telephone Encounter (Signed)
Pt was seen on 02-11-17 and 02-12-17 for neck pain. Pt said tramadol is not helping

## 2017-02-14 NOTE — Telephone Encounter (Signed)
I called the pt and she stated she did not go to see the orthopedic doctor on 3/27 after her visit here as she was in so much pain that she got her prescriptions and went home.  I advised her per Dr Maudie Mercury she really wanted her to see the specialist for recommendations as this is the reason they scheduled her so quickly and if she in that much pain she should call [facility] to get an appt with the urgent care doctor there or go to the ER.  She was given the phone number to call [facility] and is aware the MRI report will be faxed to their office.  I called [facility] and per Judeen Hammans the pt cancelled her appt on 3/27 and rescheduled for 4/3.  Message sent to Dr Maudie Mercury.

## 2017-02-21 ENCOUNTER — Ambulatory Visit: Payer: Commercial Managed Care - HMO | Admitting: Obstetrics and Gynecology

## 2017-02-26 ENCOUNTER — Telehealth: Payer: Self-pay | Admitting: Obstetrics and Gynecology

## 2017-02-26 NOTE — Telephone Encounter (Signed)
Patient canceled her 2 week post op appointment 02/28/17. Patient has another appointment and will need to call back later to reschedule.

## 2017-02-28 ENCOUNTER — Ambulatory Visit: Payer: Commercial Managed Care - HMO | Admitting: Obstetrics and Gynecology

## 2017-03-04 NOTE — Telephone Encounter (Signed)
Left another message for patient to call and reschedule her 2 week post op appointment.

## 2017-03-18 NOTE — Telephone Encounter (Signed)
Left another message for patient to call and reschedule. Okay to close encounter? °

## 2017-04-10 NOTE — Telephone Encounter (Signed)
Called patient twice with no return call to rescheduled her 2 week post op appointment. Okay to close this encounter?

## 2017-04-23 ENCOUNTER — Ambulatory Visit
Admission: RE | Admit: 2017-04-23 | Discharge: 2017-04-23 | Disposition: A | Discharge status: Home / Self Care | Payer: Commercial Managed Care - HMO | Source: Ambulatory Visit | Attending: Family Medicine | Admitting: Family Medicine

## 2017-04-23 DIAGNOSIS — E2839 Other primary ovarian failure: Secondary | ICD-10-CM

## 2017-04-23 DIAGNOSIS — Z853 Personal history of malignant neoplasm of breast: Secondary | ICD-10-CM

## 2017-05-08 ENCOUNTER — Other Ambulatory Visit: Payer: Self-pay | Admitting: Adult Health

## 2017-05-08 DIAGNOSIS — C50512 Malignant neoplasm of lower-outer quadrant of left female breast: Secondary | ICD-10-CM

## 2017-05-08 DIAGNOSIS — Z170 Estrogen receptor positive status [ER+]: Principal | ICD-10-CM

## 2017-05-09 ENCOUNTER — Ambulatory Visit (HOSPITAL_BASED_OUTPATIENT_CLINIC_OR_DEPARTMENT_OTHER): Payer: Commercial Managed Care - HMO | Admitting: Oncology

## 2017-05-09 ENCOUNTER — Other Ambulatory Visit (HOSPITAL_BASED_OUTPATIENT_CLINIC_OR_DEPARTMENT_OTHER): Payer: Commercial Managed Care - HMO

## 2017-05-09 VITALS — BP 129/68 | HR 70 | Temp 98.2°F | Resp 18 | Ht 66.0 in | Wt 160.8 lb

## 2017-05-09 DIAGNOSIS — Z170 Estrogen receptor positive status [ER+]: Principal | ICD-10-CM

## 2017-05-09 DIAGNOSIS — Z79811 Long term (current) use of aromatase inhibitors: Secondary | ICD-10-CM

## 2017-05-09 DIAGNOSIS — C50512 Malignant neoplasm of lower-outer quadrant of left female breast: Secondary | ICD-10-CM

## 2017-05-09 LAB — COMPREHENSIVE METABOLIC PANEL
ALBUMIN: 3.6 g/dL (ref 3.5–5.0)
ALK PHOS: 37 U/L — AB (ref 40–150)
ALT: 11 U/L (ref 0–55)
ANION GAP: 9 meq/L (ref 3–11)
AST: 11 U/L (ref 5–34)
BUN: 13.5 mg/dL (ref 7.0–26.0)
CHLORIDE: 106 meq/L (ref 98–109)
CO2: 27 mEq/L (ref 22–29)
Calcium: 9.4 mg/dL (ref 8.4–10.4)
Creatinine: 0.7 mg/dL (ref 0.6–1.1)
EGFR: >90 mL/min/{1.73_m2} (ref >90)
Glucose: 93 mg/dl (ref 70–140)
POTASSIUM: 3.6 meq/L (ref 3.5–5.1)
Sodium: 143 mEq/L (ref 136–145)
Total Bilirubin: 0.31 mg/dL (ref 0.20–1.20)
Total Protein: 6.9 g/dL (ref 6.4–8.3)

## 2017-05-09 LAB — CBC WITH DIFFERENTIAL/PLATELET
BASO%: 1.2 % (ref 0.0–2.0)
BASOS ABS: 0.1 10*3/uL (ref 0.0–0.1)
EOS ABS: 0.2 10*3/uL (ref 0.0–0.5)
EOS%: 3.1 % (ref 0.0–7.0)
HEMATOCRIT: 33.7 % — AB (ref 34.8–46.6)
HGB: 11.2 g/dL — ABNORMAL LOW (ref 11.6–15.9)
LYMPH#: 2 10*3/uL (ref 0.9–3.3)
LYMPH%: 34 % (ref 14.0–49.7)
MCH: 29.5 pg (ref 25.1–34.0)
MCHC: 33.1 g/dL (ref 31.5–36.0)
MCV: 89.1 fL (ref 79.5–101.0)
MONO#: 0.4 10*3/uL (ref 0.1–0.9)
MONO%: 6.7 % (ref 0.0–14.0)
NEUT#: 3.2 10*3/uL (ref 1.5–6.5)
NEUT%: 55 % (ref 38.4–76.8)
PLATELETS: 219 10*3/uL (ref 145–400)
RBC: 3.79 10*6/uL (ref 3.70–5.45)
RDW: 13.6 % (ref 11.2–14.5)
WBC: 5.8 10*3/uL (ref 3.9–10.3)

## 2017-05-09 NOTE — Progress Notes (Signed)
Krista Armstrong  [phone] [phone]     ID: Krista Armstrong DOB: April 29, 1951  MR#: [id number]  JYN#:[id number]  Patient Care Team: Lucretia Kern, DO as PCP - General (Family Medicine) Fanny Skates, MD as Consulting Physician (General Surgery) Hareem Surowiec, Virgie Dad, MD as Consulting Physician (Oncology) Thea Silversmith, MD (Inactive) as Consulting Physician (Radiation Oncology) Rockwell Germany, RN as Registered Nurse Mauro Kaufmann, RN as Registered Nurse Holley Bouche, NP as Nurse Practitioner (Nurse Practitioner) Sylvan Cheese, NP as Nurse Practitioner (Nurse Practitioner) Berle Mull, MD as Consulting Physician (Sports Medicine) PCP: Lucretia Kern, DO OTHER MD: Owens Loffler MD  CHIEF COMPLAINT: estrogen receptor positive breast cancer  CURRENT TREATMENT: tamoxifen  BREAST CANCER HISTORY: From the original intake note:  Krista Armstrong (pronounced "ROZ-mah") had routine screening mammography at the breast Center 04/12/2015, showing a possible asymmetry in the left breast. Bilateral diagnostic mammography with tomosynthesis the left breast ultrasonography 04/26/2015, showed the breast density to be category B. In the left breast there was a persistent area of focal asymmetry in the lower outer quadrant. On physical exam there was a 1 cm firm nodular area at 3:30 o'clock 7 cm from the nipple. Ultrasound confirmed an irregular hypoechoic mass measuring 7 mm corresponding to the palpable abnormality. There were no abnormal appearing axillary lymph nodes.  Biopsy of the left breast mass in question 05/03/2015 showed ([id number]) and invasive ductal carcinoma, grade 1, estrogen receptor 95% positive, progesterone receptor 1% positive, both with strong staining intensity, with an MIB-1 of 5%, and no HER-2 amplification, the signals ratio being 1.43 and the number per cell 2.50.  The patient's subsequent history is as detailed below.  INTERVAL  HISTORY: Krista Armstrong returns today for follow-up and treatment of her estrogen receptor positive breast cancer. She continues on tamoxifen, with excellent tolerance. She obtains it at a good price. Hot flashes are not a significant issue. She takes gabapentin at bedtime and that is helping. She does not have problems with vaginal wetness.  REVIEW OF SYSTEMS: She still in her honeymoon she says. She does a lot of walking for exercise. A detailed review of systems is otherwise stable  PAST MEDICAL HISTORY: Past Medical History:  Diagnosis Date  . Abnormal uterine bleeding   . Breast cancer ([place]) 05/03/15   Left Breast -Invasive ductal carcinoma  . Chronic back pain   . Colon polyps   . Family history of breast cancer   . GERD (gastroesophageal reflux disease)   . Hot flashes   . Hypertension   . Radiation 07/07/15-08/09/15   Left breast  . Ruptured lumbar disc   . Transfusion history   . Wears glasses     PAST SURGICAL HISTORY: Past Surgical History:  Procedure Laterality Date  . BACK SURGERY  2000, 2009  . BACK SURGERY    . BREAST BIOPSY Left 05/03/2015   U/S Core- Benign  . BREAST BIOPSY Right    U/S Core- Benign  . BREAST LUMPECTOMY Left 05/30/2015  . BREAST SURGERY    . colon polyps    . DILATATION & CURETTAGE/HYSTEROSCOPY WITH MYOSURE N/A 02/05/2017   Procedure: DILATATION & CURETTAGE/HYSTEROSCOPY WITH MYOSURE;  Surgeon: [name], MD;  Location: [facility];  Service: Gynecology;  Laterality: N/A;  . RADIOACTIVE SEED GUIDED MASTECTOMY WITH AXILLARY SENTINEL LYMPH NODE BIOPSY Left 05/30/2015   Procedure: LEFT PARTIAL MASTECTOMY WITH RADIOACTIVE SEED LOCALIZATION, LEFT AXILLARY SENTINEL LYMPH NODE BIOPSY;  Surgeon: Fanny Skates, MD;  Location:  [place];  Service: General;  Laterality: Left;  . ruptured lumbar disc    . STOMACH SURGERY  2006   tumors excised    FAMILY HISTORY Family History  Problem Relation Age of Onset  . Breast  cancer Mother 63  . Stroke Father   . Heart disease Maternal Grandmother   . Cervical cancer Maternal Aunt   . Colon cancer Neg Hx   . Colon polyps Neg Hx   . Diabetes Neg Hx   . Kidney disease Neg Hx   . Esophageal cancer Neg Hx   . Gallbladder disease Neg Hx   the patient's father died at the age of 68 from a ruptured aortic aneurysm. The patient's mother was diagnosed with breast cancer at the age of 74 and died within a year from that problem. The patient had no brothers, one sister. There is no other history of breast or ovarian cancer in the family.  GYNECOLOGIC HISTORY:  No LMP recorded. Patient is postmenopausal. Menarche age 5, first live birth age 42. The patient is GX P3. She stopped having periods in the late 1990s. She did not take hormone replacement. She did take oral contraceptives remotely for approximately 10 years with no complications.  SOCIAL HISTORY: (Updated June 2018). 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. She Has remarried, her husband is a Administrator and he is gone 2 or 3 weeks out of every month. At home she lives with her daughter Krista Armstrong, who  works as an expected of Environmental consultant out of her home. The patient's 2 other children are Krista Armstrong, who lives in [place] and works as a better urinary and; and Transport planner, who lives in [place] and works as an Web designer. The patient has 4 grandchildren. She attends [employer].     ADVANCED DIRECTIVES: not in place;  At the initial clinic visit  the patient was given the appropriate forms to complete and notarize at her discretion.  HEALTH MAINTENANCE: Social History  Substance Use Topics  . Smoking status: Never Smoker  . Smokeless tobacco: Never Used  . Alcohol use 4.2 oz/week    7 Glasses of wine per week     Comment: wine every  night      Colonoscopy:03/16/2015  PAP:  Bone density:never  Lipid panel:  Allergies    Allergen Reactions  . Vicodin [Hydrocodone-Acetaminophen] Other (See Comments)    Massive headache    Current Outpatient Prescriptions  Medication Sig Dispense Refill  . amLODipine (NORVASC) 5 MG tablet TAKE 1 TABLET (5 MG TOTAL) BY MOUTH DAILY. 90 tablet 3  . Biotin 1 MG CAPS Take by mouth.    . cyclobenzaprine (FLEXERIL) 5 MG tablet Take 1 tablet (5 mg total) by mouth 2 (two) times daily as needed for muscle spasms. 20 tablet 0  . esomeprazole (NEXIUM) 40 MG capsule Take 1 capsule (40 mg total) by mouth daily. 90 capsule 1  . gabapentin (NEURONTIN) 300 MG capsule Take 2 capsules (600 mg total) by mouth at bedtime. 180 capsule 4  . losartan (COZAAR) 50 MG tablet TAKE 1 TABLET (50 MG TOTAL) BY MOUTH DAILY. *MAX DAY SUPPLY PER INSURANCE* 90 tablet 3  . predniSONE (DELTASONE) 10 MG tablet 60m (5 tabs) x 2 days, 363m(3 tabs) x 2 days, 2021m2 tabs) x 2 days, 20m19m tab) x 2 days 22 tablet 0  . tamoxifen (NOLVADEX) 20 MG tablet Take 1  tablet (20 mg total) by mouth daily. 90 tablet 4  . traMADol (ULTRAM) 50 MG tablet Take 1 tablet (50 mg total) by mouth every 12 (twelve) hours as needed. 8 tablet 0   No current facility-administered medications for this visit.     OBJECTIVE: middle-aged African-American woman I who appears well  Vitals:   05/09/17 1531  BP: 129/68  Pulse: 70  Resp: 18  Temp: 98.2 F (36.8 C)     Body mass index is 25.95 kg/m.    ECOG FS:0 - Asymptomatic  Sclerae unicteric, EOMs intact Oropharynx clear and moist No cervical or supraclavicular adenopathy Lungs no rales or rhonchi Heart regular rate and rhythm Abd soft, nontender, positive bowel sounds MSK no focal spinal tenderness, no upper extremity lymphedema Neuro: nonfocal, well oriented, appropriate affect Breasts: The right breast as undergone lumpectomy followed by radiation with no evidence of local recurrence. The left breast is benign. Both axillae are benign.   LAB RESULTS:  CMP     Component  Value Date/Time   NA 143 05/09/2017 1454   K 3.6 05/09/2017 1454   CL 104 02/05/2017 0704   CO2 27 05/09/2017 1454   GLUCOSE 93 05/09/2017 1454   BUN 13.5 05/09/2017 1454   CREATININE 0.7 05/09/2017 1454   CALCIUM 9.4 05/09/2017 1454   PROT 6.9 05/09/2017 1454   ALBUMIN 3.6 05/09/2017 1454   AST 11 05/09/2017 1454   ALT 11 05/09/2017 1454   ALKPHOS 37 (L) 05/09/2017 1454   BILITOT 0.31 05/09/2017 1454    INo results found for: SPEP, UPEP  Lab Results  Component Value Date   WBC 5.8 05/09/2017   NEUTROABS 3.2 05/09/2017   HGB 11.2 (L) 05/09/2017   HCT 33.7 (L) 05/09/2017   MCV 89.1 05/09/2017   PLT 219 05/09/2017      Chemistry      Component Value Date/Time   NA 143 05/09/2017 1454   K 3.6 05/09/2017 1454   CL 104 02/05/2017 0704   CO2 27 05/09/2017 1454   BUN 13.5 05/09/2017 1454   CREATININE 0.7 05/09/2017 1454      Component Value Date/Time   CALCIUM 9.4 05/09/2017 1454   ALKPHOS 37 (L) 05/09/2017 1454   AST 11 05/09/2017 1454   ALT 11 05/09/2017 1454   BILITOT 0.31 05/09/2017 1454      No results found for: LABCA2  No components found for: LABCA125  No results for input(s): INR in the last 168 hours.  Urinalysis    Component Value Date/Time   BILIRUBINUR negative 11/09/2016 0855   PROTEINUR negative 11/09/2016 0855   UROBILINOGEN 0.2 11/09/2016 0855   NITRITE negative 11/09/2016 0855   LEUKOCYTESUR Negative 11/09/2016 0855    STUDIES: Dg Bone Density  Result Date: 04/23/2017 EXAM: DUAL X-RAY ABSORPTIOMETRY (DXA) FOR BONE MINERAL DENSITY IMPRESSION: Referring Physician:  Lucretia Kern PATIENT: Name: Krista Armstrong, Krista Armstrong Patient ID: [id number] Birth Date: 07-May-1951 Height: 66.0 in. Sex: Female Measured: 04/23/2017 Weight: 157.5 lbs. Indications: Breast Cancer History, Estrogen Deficient, Postmenopausal, Tamoxifen Fractures: None Treatments: Hormone Therapy For Cancer ASSESSMENT: The BMD measured at Femur Neck Left is 0.894 g/cm2 with a T-score of -1.0. This  patient is considered normal according to [facility]) criteria. Site Region Measured Date Measured Age YA BMD Significant CHANGE T-score DualFemur Neck Left 5/23/2017    65.2         -1.0    0.894 g/cm2 AP Spine  L1-L4     5/23/2017  [age]         0.2     1.217 g/cm2 World Health Organization [facility]) criteria for post-menopausal, Caucasian Women: Normal       T-score at or above -1 SD Osteopenia   T-score between -1 and -2.5 SD Osteoporosis T-score at or below -2.5 SD RECOMMENDATION: [facility] recommends that FDA-approved medical therapies be considered in postmenopausal women and men age 7 or older with a: 1. Hip or vertebral (clinical or morphometric) fracture. 2. T-score of <-2.5 at the spine or hip. 3. Ten-year fracture probability by FRAX of 3% or greater for hip fracture or 20% or greater for major osteoporotic fracture. All treatment decisions require clinical judgment and consideration of individual patient factors, including patient preferences, co-morbidities, previous drug use, risk factors not captured in the FRAX model (e.g. falls, vitamin D deficiency, increased bone turnover, interval significant decline in bone density) and possible under - or over-estimation of fracture risk by FRAX. All patients should ensure an adequate intake of dietary calcium (1200 mg/d) and vitamin D (800 IU daily) unless contraindicated. FOLLOW-UP: People with diagnosed cases of osteoporosis or at high risk for fracture should have regular bone mineral density tests. For patients eligible for Medicare, routine testing is allowed once every 2 years. The testing frequency can be increased to one year for patients who have rapidly progressing disease, those who are receiving or discontinuing medical therapy to restore bone mass, or have additional risk factors. I have reviewed this report, and agree with the above findings. [facility] Radiology Electronically Signed   By: Rolm Baptise  M.D.   On: 04/23/2017 15:14   Mm Diag Breast Tomo Bilateral  Result Date: 04/23/2017 CLINICAL DATA:  History of treated left breast cancer, status post lumpectomy and radiation therapy in 2016. EXAM: 2D DIGITAL DIAGNOSTIC BILATERAL MAMMOGRAM WITH CAD AND ADJUNCT TOMO COMPARISON:  Previous exam(s). ACR Breast Density Category c: The breast tissue is heterogeneously dense, which may obscure small masses. FINDINGS: Mammographically, there are no suspicious masses, areas of nonsurgical architectural distortion or microcalcifications in either breast. Expected posttreatment changes in the left breast are noted. Mammographic images were processed with CAD. IMPRESSION: No mammographic evidence of malignancy in either breast, status post left lumpectomy. RECOMMENDATION: Diagnostic mammogram is suggested in 1 year. (Code:[id number]-B-01Y) I have discussed the findings and recommendations with the patient. Results were also provided in writing at the conclusion of the visit. If applicable, a reminder letter will be sent to the patient regarding the next appointment. BI-RADS CATEGORY  2: Benign. Electronically Signed   By: Fidela Salisbury M.D.   On: 04/23/2017 15:40    ASSESSMENT: 66 y.o. [place] woman status post left breast lower outer quadrant biopsy 05/03/2015 for a clinical T1b N0, stage IA invasive ductal carcinoma, grade 1, estrogen receptor strongly positive, progesterone receptor minimally positive, with an MIB-1 of 5% and no HER-2 amplification.  (1) left lumpectomy and sentinel lymph node sampling 05/30/2015 showed a pT1b pN0, stage IA invasive ductal carcinoma, grade 1, with close but negative margins, and repeat HER-2 again negative  (2) Oncotype score of 20 (intermediate risk) predicts a 10 year risk of recurrence outside the breast of 13% if the patient's only systemic therapy is tamoxifen for 5 years.   (a) the patient opted against chemotherapy given the very marginal benefits in her situation  (3)  adjuvant radiation 07/07/2015-08/09/2015:  Left breast/ 42.72 Gy at 2.67 Gy per fraction x 16 fractions.  Left breast boost/ 10 Gy at  2 Gy per fraction x 5 fractions  (4) tamoxifen started 11/20/2015  (5) genetics testing 09/05/2015 through the [place] gene panel performed by [facility] offers found no deleterious mutations in BRCA1 c.68_69delAG (also known as 185delAG or 187delAGE), BRCA1 c.5266dupC (also known as 5382insC or 5385insC) and BRCA2 c.5946delT (also known as 6174delT).   PLAN: Lanny is now a year and a half out from definitive surgery for her breast cancer with no evidence of disease recurrence. This is favorable.  She is tolerating tamoxifen well and the plan will be to continue that a total of 5 years.  At this point her plan to move to [facility] has been shelved. Also from this point I feel comfortable seeing her on a once a year basis.  She knows to call for any problems that may develop before that visit.   Brock Bad, MD   05/11/2017 8:24 PM

## 2017-05-28 ENCOUNTER — Other Ambulatory Visit: Payer: Self-pay | Admitting: *Deleted

## 2017-05-28 MED ORDER — TAMOXIFEN CITRATE 20 MG PO TABS: 20.0000 mg | ORAL_TABLET | Freq: Every day | ORAL | 3 refills | Status: DC

## 2017-06-04 ENCOUNTER — Telehealth: Payer: Self-pay | Admitting: Medical Oncology

## 2017-06-04 ENCOUNTER — Other Ambulatory Visit: Payer: Self-pay

## 2017-06-04 MED ORDER — GABAPENTIN 300 MG PO CAPS: 600.0000 mg | ORAL_CAPSULE | Freq: Every day | ORAL | 3 refills | Status: DC

## 2017-06-04 NOTE — Telephone Encounter (Signed)
Pharmacy called . Pt requests refill on gabapentin . Please send if ok

## 2017-06-11 ENCOUNTER — Other Ambulatory Visit: Payer: Self-pay | Admitting: *Deleted

## 2017-07-19 ENCOUNTER — Encounter: Payer: Self-pay | Admitting: Obstetrics and Gynecology

## 2017-07-21 ENCOUNTER — Inpatient Hospital Stay (HOSPITAL_COMMUNITY)
Admission: EM | Admit: 2017-07-21 | Discharge: 2017-07-25 | DRG: 871 | Disposition: A | Discharge status: Home / Self Care | Payer: 59 | Attending: Urology | Admitting: Urology

## 2017-07-21 ENCOUNTER — Emergency Department (HOSPITAL_COMMUNITY): Payer: 59

## 2017-07-21 ENCOUNTER — Emergency Department (HOSPITAL_COMMUNITY): Payer: 59 | Admitting: Anesthesiology

## 2017-07-21 ENCOUNTER — Encounter (HOSPITAL_COMMUNITY)
Admission: EM | Disposition: A | Discharge status: Home / Self Care | Payer: Self-pay | Source: Home / Self Care | Attending: Urology

## 2017-07-21 ENCOUNTER — Encounter (HOSPITAL_COMMUNITY): Payer: Self-pay | Admitting: Emergency Medicine

## 2017-07-21 DIAGNOSIS — N179 Acute kidney failure, unspecified: Secondary | ICD-10-CM | POA: Diagnosis present

## 2017-07-21 DIAGNOSIS — G8929 Other chronic pain: Secondary | ICD-10-CM | POA: Diagnosis present

## 2017-07-21 DIAGNOSIS — I10 Essential (primary) hypertension: Secondary | ICD-10-CM | POA: Diagnosis present

## 2017-07-21 DIAGNOSIS — R000 Tachycardia, unspecified: Secondary | ICD-10-CM | POA: Diagnosis not present

## 2017-07-21 DIAGNOSIS — Z8619 Personal history of other infectious and parasitic diseases: Secondary | ICD-10-CM

## 2017-07-21 DIAGNOSIS — R6521 Severe sepsis with septic shock: Secondary | ICD-10-CM | POA: Diagnosis not present

## 2017-07-21 DIAGNOSIS — R110 Nausea: Secondary | ICD-10-CM | POA: Diagnosis not present

## 2017-07-21 DIAGNOSIS — N200 Calculus of kidney: Secondary | ICD-10-CM

## 2017-07-21 DIAGNOSIS — R350 Frequency of micturition: Secondary | ICD-10-CM | POA: Diagnosis not present

## 2017-07-21 DIAGNOSIS — Z853 Personal history of malignant neoplasm of breast: Secondary | ICD-10-CM

## 2017-07-21 DIAGNOSIS — K219 Gastro-esophageal reflux disease without esophagitis: Secondary | ICD-10-CM | POA: Diagnosis not present

## 2017-07-21 DIAGNOSIS — E876 Hypokalemia: Secondary | ICD-10-CM | POA: Diagnosis present

## 2017-07-21 DIAGNOSIS — R6520 Severe sepsis without septic shock: Secondary | ICD-10-CM

## 2017-07-21 DIAGNOSIS — Z885 Allergy status to narcotic agent status: Secondary | ICD-10-CM

## 2017-07-21 DIAGNOSIS — N132 Hydronephrosis with renal and ureteral calculous obstruction: Secondary | ICD-10-CM | POA: Diagnosis not present

## 2017-07-21 DIAGNOSIS — N10 Acute tubulo-interstitial nephritis: Secondary | ICD-10-CM | POA: Diagnosis not present

## 2017-07-21 DIAGNOSIS — A419 Sepsis, unspecified organism: Secondary | ICD-10-CM | POA: Diagnosis not present

## 2017-07-21 DIAGNOSIS — R509 Fever, unspecified: Secondary | ICD-10-CM | POA: Diagnosis not present

## 2017-07-21 DIAGNOSIS — N136 Pyonephrosis: Secondary | ICD-10-CM | POA: Diagnosis present

## 2017-07-21 DIAGNOSIS — R0602 Shortness of breath: Secondary | ICD-10-CM

## 2017-07-21 DIAGNOSIS — N201 Calculus of ureter: Secondary | ICD-10-CM | POA: Diagnosis not present

## 2017-07-21 HISTORY — PX: CYSTOSCOPY WITH RETROGRADE PYELOGRAM, URETEROSCOPY AND STENT PLACEMENT: SHX5789

## 2017-07-21 HISTORY — DX: Personal history of other infectious and parasitic diseases: Z86.19

## 2017-07-21 LAB — CBC
HEMATOCRIT: 34.4 % — AB (ref 36.0–46.0)
Hemoglobin: 11.3 g/dL — ABNORMAL LOW (ref 12.0–15.0)
MCH: 29 pg (ref 26.0–34.0)
MCHC: 32.8 g/dL (ref 30.0–36.0)
MCV: 88.4 fL (ref 78.0–100.0)
PLATELETS: 224 10*3/uL (ref 150–400)
RBC: 3.89 MIL/uL (ref 3.87–5.11)
RDW: 13.4 % (ref 11.5–15.5)
WBC: 13.4 10*3/uL — ABNORMAL HIGH (ref 4.0–10.5)

## 2017-07-21 LAB — COMPREHENSIVE METABOLIC PANEL
ALT: 19 U/L (ref 14–54)
AST: 32 U/L (ref 15–41)
Albumin: 3.8 g/dL (ref 3.5–5.0)
Alkaline Phosphatase: 28 U/L — ABNORMAL LOW (ref 38–126)
Anion gap: 12 (ref 5–15)
BUN: 12 mg/dL (ref 6–20)
CHLORIDE: 105 mmol/L (ref 101–111)
CO2: 22 mmol/L (ref 22–32)
CREATININE: 1.05 mg/dL — AB (ref 0.44–1.00)
Calcium: 8.6 mg/dL — ABNORMAL LOW (ref 8.9–10.3)
GFR calc non Af Amer: 55 mL/min — ABNORMAL LOW (ref >60)
Glucose, Bld: 130 mg/dL — ABNORMAL HIGH (ref 65–99)
Potassium: 3.7 mmol/L (ref 3.5–5.1)
SODIUM: 139 mmol/L (ref 135–145)
Total Bilirubin: 0.7 mg/dL (ref 0.3–1.2)
Total Protein: 7.1 g/dL (ref 6.5–8.1)

## 2017-07-21 LAB — URINALYSIS, ROUTINE W REFLEX MICROSCOPIC
Bilirubin Urine: NEGATIVE
GLUCOSE, UA: NEGATIVE mg/dL
Ketones, ur: 5 mg/dL — AB
Nitrite: POSITIVE — AB
PH: 5 (ref 5.0–8.0)
PROTEIN: NEGATIVE mg/dL
Specific Gravity, Urine: 1.016 (ref 1.005–1.030)

## 2017-07-21 LAB — I-STAT CG4 LACTIC ACID, ED: Lactic Acid, Venous: 2.68 mmol/L (ref 0.5–1.9)

## 2017-07-21 LAB — LIPASE, BLOOD: Lipase: 22 U/L (ref 11–51)

## 2017-07-21 SURGERY — CYSTOURETEROSCOPY, WITH RETROGRADE PYELOGRAM AND STENT INSERTION
Anesthesia: General | Laterality: Left

## 2017-07-21 MED ORDER — DEXTROSE 5 % IV SOLN
2.0000 g | INTRAVENOUS | Status: DC
  Administered 2017-07-22 – 2017-07-24 (×3): 2 g via INTRAVENOUS
  Filled 2017-07-21 (×3): qty 2

## 2017-07-21 MED ORDER — DEXTROSE 5 % IV SOLN
1.0000 g | INTRAVENOUS | Status: DC
  Administered 2017-07-21: 1 g via INTRAVENOUS

## 2017-07-21 MED ORDER — SODIUM CHLORIDE 0.9 % IV SOLN
INTRAVENOUS | Status: DC
Start: 2017-07-22 — End: 2017-07-24
  Administered 2017-07-22 – 2017-07-23 (×7): via INTRAVENOUS

## 2017-07-21 MED ORDER — DEXAMETHASONE SODIUM PHOSPHATE 10 MG/ML IJ SOLN
INTRAMUSCULAR | Status: DC | PRN
  Administered 2017-07-21: 10 mg via INTRAVENOUS

## 2017-07-21 MED ORDER — SODIUM CHLORIDE 0.9 % IV SOLN
INTRAVENOUS | Status: DC | PRN
  Administered 2017-07-21: 50 mL

## 2017-07-21 MED ORDER — OXYCODONE-ACETAMINOPHEN 5-325 MG PO TABS
1.0000 | ORAL_TABLET | ORAL | Status: DC | PRN
  Administered 2017-07-22 (×2): 2 via ORAL
  Filled 2017-07-21 (×2): qty 2

## 2017-07-21 MED ORDER — ZOLPIDEM TARTRATE 5 MG PO TABS: 5.0000 mg | ORAL_TABLET | Freq: Every evening | ORAL | Status: DC | PRN

## 2017-07-21 MED ORDER — GABAPENTIN 300 MG PO CAPS
600.0000 mg | ORAL_CAPSULE | Freq: Every day | ORAL | Status: DC
  Administered 2017-07-22 – 2017-07-24 (×3): 600 mg via ORAL
  Filled 2017-07-21 (×3): qty 2

## 2017-07-21 MED ORDER — ONDANSETRON HCL 4 MG/2ML IJ SOLN
INTRAMUSCULAR | Status: DC | PRN
  Administered 2017-07-21: 4 mg via INTRAVENOUS

## 2017-07-21 MED ORDER — SODIUM CHLORIDE 0.9 % IV BOLUS (SEPSIS)
1000.0000 mL | Freq: Once | INTRAVENOUS | Status: AC
  Administered 2017-07-21: 1000 mL via INTRAVENOUS

## 2017-07-21 MED ORDER — ONDANSETRON HCL 4 MG/2ML IJ SOLN
4.0000 mg | Freq: Once | INTRAMUSCULAR | Status: AC
  Administered 2017-07-21: 4 mg via INTRAVENOUS
  Filled 2017-07-21: qty 2

## 2017-07-21 MED ORDER — PROCHLORPERAZINE EDISYLATE 5 MG/ML IJ SOLN
10.0000 mg | Freq: Once | INTRAMUSCULAR | Status: AC
  Administered 2017-07-21: 10 mg via INTRAVENOUS
  Filled 2017-07-21: qty 2

## 2017-07-21 MED ORDER — LACTATED RINGERS IV SOLN
INTRAVENOUS | Status: DC | PRN
  Administered 2017-07-21: 22:00:00 via INTRAVENOUS

## 2017-07-21 MED ORDER — PANTOPRAZOLE SODIUM 40 MG PO TBEC
40.0000 mg | DELAYED_RELEASE_TABLET | Freq: Every day | ORAL | Status: DC
  Administered 2017-07-22 – 2017-07-25 (×4): 40 mg via ORAL
  Filled 2017-07-21 (×4): qty 1

## 2017-07-21 MED ORDER — PROPOFOL 10 MG/ML IV BOLUS
INTRAVENOUS | Status: AC
  Filled 2017-07-21: qty 20

## 2017-07-21 MED ORDER — DEXTROSE 5 % IV SOLN
1.0000 g | Freq: Once | INTRAVENOUS | Status: AC
  Administered 2017-07-21: 1 g via INTRAVENOUS
  Filled 2017-07-21: qty 10

## 2017-07-21 MED ORDER — DIPHENHYDRAMINE HCL 50 MG/ML IJ SOLN: 12.5000 mg | Freq: Four times a day (QID) | INTRAMUSCULAR | Status: DC | PRN

## 2017-07-21 MED ORDER — ONDANSETRON HCL 4 MG/2ML IJ SOLN: 4.0000 mg | INTRAMUSCULAR | Status: DC | PRN

## 2017-07-21 MED ORDER — DIPHENHYDRAMINE HCL 12.5 MG/5ML PO ELIX: 12.5000 mg | ORAL_SOLUTION | Freq: Four times a day (QID) | ORAL | Status: DC | PRN

## 2017-07-21 MED ORDER — FENTANYL CITRATE (PF) 100 MCG/2ML IJ SOLN
INTRAMUSCULAR | Status: DC | PRN
  Administered 2017-07-21 (×2): 50 ug via INTRAVENOUS

## 2017-07-21 MED ORDER — PHENYLEPHRINE 40 MCG/ML (10ML) SYRINGE FOR IV PUSH (FOR BLOOD PRESSURE SUPPORT)
PREFILLED_SYRINGE | INTRAVENOUS | Status: DC | PRN
  Administered 2017-07-21 (×3): 120 ug via INTRAVENOUS

## 2017-07-21 MED ORDER — FENTANYL CITRATE (PF) 100 MCG/2ML IJ SOLN
INTRAMUSCULAR | Status: AC
  Filled 2017-07-21: qty 2

## 2017-07-21 MED ORDER — SODIUM CHLORIDE 0.9 % IR SOLN
Status: DC | PRN
  Administered 2017-07-21: 3000 mL

## 2017-07-21 MED ORDER — MIDAZOLAM HCL 2 MG/2ML IJ SOLN
INTRAMUSCULAR | Status: DC | PRN
  Administered 2017-07-21: 2 mg via INTRAVENOUS

## 2017-07-21 MED ORDER — HYOSCYAMINE SULFATE 0.125 MG SL SUBL
0.1250 mg | SUBLINGUAL_TABLET | SUBLINGUAL | Status: DC | PRN
  Filled 2017-07-21: qty 1

## 2017-07-21 MED ORDER — DIPHENHYDRAMINE HCL 50 MG/ML IJ SOLN
25.0000 mg | Freq: Once | INTRAMUSCULAR | Status: AC
  Administered 2017-07-21: 25 mg via INTRAVENOUS
  Filled 2017-07-21: qty 1

## 2017-07-21 MED ORDER — ACETAMINOPHEN 500 MG PO TABS
1000.0000 mg | ORAL_TABLET | Freq: Once | ORAL | Status: AC
  Administered 2017-07-21: 1000 mg via ORAL
  Filled 2017-07-21: qty 2

## 2017-07-21 MED ORDER — MORPHINE SULFATE (PF) 4 MG/ML IV SOLN
4.0000 mg | Freq: Once | INTRAVENOUS | Status: AC
  Administered 2017-07-21: 4 mg via INTRAVENOUS
  Filled 2017-07-21: qty 1

## 2017-07-21 MED ORDER — MORPHINE SULFATE (PF) 4 MG/ML IV SOLN
8.0000 mg | Freq: Once | INTRAVENOUS | Status: AC
  Administered 2017-07-21: 8 mg via INTRAVENOUS
  Filled 2017-07-21: qty 2

## 2017-07-21 MED ORDER — KETOROLAC TROMETHAMINE 30 MG/ML IJ SOLN: 30.0000 mg | Freq: Once | INTRAMUSCULAR | Status: DC | PRN

## 2017-07-21 MED ORDER — LIDOCAINE 2% (20 MG/ML) 5 ML SYRINGE
INTRAMUSCULAR | Status: DC | PRN
  Administered 2017-07-21: 60 mg via INTRAVENOUS

## 2017-07-21 MED ORDER — TAMOXIFEN CITRATE 10 MG PO TABS
20.0000 mg | ORAL_TABLET | Freq: Every day | ORAL | Status: DC
  Administered 2017-07-22 – 2017-07-25 (×4): 20 mg via ORAL
  Filled 2017-07-21 (×4): qty 2

## 2017-07-21 MED ORDER — FENTANYL CITRATE (PF) 100 MCG/2ML IJ SOLN: 25.0000 ug | INTRAMUSCULAR | Status: DC | PRN

## 2017-07-21 MED ORDER — PHENYLEPHRINE HCL-NACL 10-0.9 MG/250ML-% IV SOLN
INTRAVENOUS | Status: AC
  Filled 2017-07-21: qty 250

## 2017-07-21 MED ORDER — MIDAZOLAM HCL 2 MG/2ML IJ SOLN
INTRAMUSCULAR | Status: AC
  Filled 2017-07-21: qty 2

## 2017-07-21 MED ORDER — HYDROMORPHONE HCL 1 MG/ML IJ SOLN: 0.5000 mg | INTRAMUSCULAR | Status: DC | PRN

## 2017-07-21 MED ORDER — PROMETHAZINE HCL 25 MG/ML IJ SOLN: 6.2500 mg | INTRAMUSCULAR | Status: DC | PRN

## 2017-07-21 MED ORDER — DEXTROSE 5 % IV SOLN
INTRAVENOUS | Status: AC
  Filled 2017-07-21: qty 10

## 2017-07-21 MED ORDER — PROPOFOL 10 MG/ML IV BOLUS
INTRAVENOUS | Status: DC | PRN
  Administered 2017-07-21: 150 mg via INTRAVENOUS

## 2017-07-21 SURGICAL SUPPLY — 28 items
BAG URO CATCHER STRL LF (MISCELLANEOUS) ×3 IMPLANT
BASKET DAKOTA 1.9FR 11X120 (BASKET) IMPLANT
BASKET LASER NITINOL 1.9FR (BASKET) IMPLANT
CATH FOLEY LATEX FREE 20FR (CATHETERS)
CATH FOLEY LF 20FR (CATHETERS) IMPLANT
CATH INTERMIT  6FR 70CM (CATHETERS) ×3 IMPLANT
CLOTH BEACON ORANGE TIMEOUT ST (SAFETY) ×3 IMPLANT
COVER FOOTSWITCH UNIV (MISCELLANEOUS) IMPLANT
COVER SURGICAL LIGHT HANDLE (MISCELLANEOUS) ×3 IMPLANT
EXTRACTOR STONE NITINOL NGAGE (UROLOGICAL SUPPLIES) IMPLANT
FIBER LASER TRAC TIP (UROLOGICAL SUPPLIES) IMPLANT
GLOVE BIO SURGEON STRL SZ8 (GLOVE) ×3 IMPLANT
GOWN STRL REUS W/TWL XL LVL3 (GOWN DISPOSABLE) ×3 IMPLANT
GUIDEWIRE ANG ZIPWIRE 038X150 (WIRE) ×3 IMPLANT
GUIDEWIRE STR DUAL SENSOR (WIRE) ×3 IMPLANT
IV NS 1000ML (IV SOLUTION) ×2
IV NS 1000ML BAXH (IV SOLUTION) ×1 IMPLANT
MANIFOLD NEPTUNE II (INSTRUMENTS) ×3 IMPLANT
PACK CYSTO (CUSTOM PROCEDURE TRAY) ×3 IMPLANT
SHEATH ACCESS URETERAL 38CM (SHEATH) IMPLANT
STENT CONTOUR 6FRX26X.038 (STENTS) IMPLANT
STENT URET 6FRX26 CONTOUR (STENTS) ×6 IMPLANT
SYR CONTROL 10ML LL (SYRINGE) IMPLANT
SYRINGE IRR TOOMEY STRL 70CC (SYRINGE) IMPLANT
TRAY FOLEY W/METER SILVER 16FR (SET/KITS/TRAYS/PACK) ×3 IMPLANT
TUBE FEEDING 8FR 16IN STR KANG (MISCELLANEOUS) IMPLANT
TUBING CONNECTING 10 (TUBING) ×2 IMPLANT
TUBING CONNECTING 10' (TUBING) ×1

## 2017-07-21 NOTE — Transfer of Care (Signed)
Immediate Anesthesia Transfer of Care Note  Patient: Krista Armstrong  Procedure(s) Performed: Procedure(s): CYSTOSCOPY WITH RETROGRADE PYELOGRAM, URETEROSCOPY AND STENT PLACEMENT (Left)  Patient Location: PACU  Anesthesia Type:General  Level of Consciousness: sedated  Airway & Oxygen Therapy: Patient Spontanous Breathing and Patient connected to face mask oxygen  Post-op Assessment: Report given to RN and Post -op Vital signs reviewed and stable  Post vital signs: Reviewed and stable  Last Vitals:  Vitals:   07/21/17 2259 07/21/17 2300  BP: 123/77 122/78  Pulse: (!) 123 (!) 123  Resp: (!) 25 (!) 26  Temp:    SpO2: 96% 96%    Last Pain:  Vitals:   07/21/17 1637  PainSc: 10-Worst pain ever         Complications: No apparent anesthesia complications

## 2017-07-21 NOTE — H&P (Signed)
Urology Admission H&P  Chief Complaint: left flank pain  History of Present Illness: Krista Armstrong is a 66yo with a  Hx of breast cancer who presented to [facility] ER with a 12 hour history of severe, sharp, constant, nonradiating left flank pain. She has associated nausea and vomiting. This is her first stone event. She has urinary frequency, urgency and dysuria. No exacerbating/allevaiting events. No other associated symptoms. CT scan showed a 10mm Left UPJ calculus with moderate hydronephrosis. WBC count was 13. While int he Er she developed a fever of 102.9, tachycardia and hypotension.   Past Medical History:  Diagnosis Date  . Abnormal uterine bleeding   . Breast cancer ([place]) 05/03/15   Left Breast -Invasive ductal carcinoma  . Chronic back pain   . Colon polyps   . Family history of breast cancer   . GERD (gastroesophageal reflux disease)   . Hot flashes   . Hypertension   . Radiation 07/07/15-08/09/15   Left breast  . Ruptured lumbar disc   . Transfusion history   . Wears glasses    Past Surgical History:  Procedure Laterality Date  . BACK SURGERY  2000, 2009  . BACK SURGERY    . BREAST BIOPSY Left 05/03/2015   U/S Core- Benign  . BREAST BIOPSY Right    U/S Core- Benign  . BREAST LUMPECTOMY Left 05/30/2015  . BREAST SURGERY    . colon polyps    . DILATATION & CURETTAGE/HYSTEROSCOPY WITH MYOSURE N/A 02/05/2017   Procedure: DILATATION & CURETTAGE/HYSTEROSCOPY WITH MYOSURE;  Surgeon: [name], MD;  Location[facility];  Service: Gynecology;  Laterality: N/A;  . RADIOACTIVE SEED GUIDED MASTECTOMY WITH AXILLARY SENTINEL LYMPH NODE BIOPSY Left 05/30/2015   Procedure: LEFT PARTIAL MASTECTOMY WITH RADIOACTIVE SEED LOCALIZATION, LEFT AXILLARY SENTINEL LYMPH NODE BIOPSY;  Surgeon: Fanny Skates, MD;  Location: [place];  Service: General;  Laterality: Left;  . ruptured lumbar disc    . STOMACH SURGERY  2006   tumors excised    Home  Medications:   (Not in a hospital admission) Allergies:  Allergies  Allergen Reactions  . Vicodin [Hydrocodone-Acetaminophen] Other (See Comments)    Massive headache    Family History  Problem Relation Age of Onset  . Breast cancer Mother 76  . Stroke Father   . Heart disease Maternal Grandmother   . Cervical cancer Maternal Aunt   . Colon cancer Neg Hx   . Colon polyps Neg Hx   . Diabetes Neg Hx   . Kidney disease Neg Hx   . Esophageal cancer Neg Hx   . Gallbladder disease Neg Hx    Social History:  reports that she has never smoked. She has never used smokeless tobacco. She reports that she drinks about 4.2 oz of alcohol per week . She reports that she does not use drugs.  Review of Systems  Constitutional: Positive for chills, fever and malaise/fatigue.  Gastrointestinal: Positive for nausea and vomiting.  Genitourinary: Positive for dysuria, flank pain, frequency and urgency.  All other systems reviewed and are negative.   Physical Exam:  Vital signs in last 24 hours: Temp:  [98.9 F (37.2 C)-102.9 F (39.4 C)] 102.9 F (39.4 C) (09/02 1953) Pulse Rate:  [93-114] 114 (09/02 2100) Resp:  [18] 18 (09/02 1953) BP: (92-130)/(45-86) 108/65 (09/02 2100) SpO2:  [91 %-100 %] 92 % (09/02 2100) Weight:  [72.6 kg (160 lb)] 72.6 kg (160 lb) (09/02 1305) Physical Exam  Constitutional: She is  oriented to person, place, and time. She appears well-developed and well-nourished.  HENT:  Head: Normocephalic and atraumatic.  Eyes: Pupils are equal, round, and reactive to light. EOM are normal.  Neck: Normal range of motion. No thyromegaly present.  Cardiovascular: Normal rate and regular rhythm.   Respiratory: Effort normal. No respiratory distress.  GI: Soft. She exhibits no distension.  Musculoskeletal: Normal range of motion. She exhibits no edema.  Neurological: She is alert and oriented to person, place, and time.  Skin: Skin is warm and dry.  Psychiatric: She has a  normal mood and affect. Her behavior is normal. Judgment and thought content normal.    Laboratory Data:  Results for orders placed or performed during the hospital encounter of 07/21/17 (from the past 24 hour(s))  Lipase, blood     Status: None   Collection Time: 07/21/17  1:13 PM  Result Value Ref Range   Lipase 22 11 - 51 U/L  Comprehensive metabolic panel     Status: Abnormal   Collection Time: 07/21/17  1:13 PM  Result Value Ref Range   Sodium 139 135 - 145 mmol/L   Potassium 3.7 3.5 - 5.1 mmol/L   Chloride 105 101 - 111 mmol/L   CO2 22 22 - 32 mmol/L   Glucose, Bld 130 (H) 65 - 99 mg/dL   BUN 12 6 - 20 mg/dL   Creatinine, Ser 1.05 (H) 0.44 - 1.00 mg/dL   Calcium 8.6 (L) 8.9 - 10.3 mg/dL   Total Protein 7.1 6.5 - 8.1 g/dL   Albumin 3.8 3.5 - 5.0 g/dL   AST 32 15 - 41 U/L   ALT 19 14 - 54 U/L   Alkaline Phosphatase 28 (L) 38 - 126 U/L   Total Bilirubin 0.7 0.3 - 1.2 mg/dL   GFR calc non Af Amer 55 (L) >60 mL/min   GFR calc Af Amer >60 >60 mL/min   Anion gap 12 5 - 15  CBC     Status: Abnormal   Collection Time: 07/21/17  1:13 PM  Result Value Ref Range   WBC 13.4 (H) 4.0 - 10.5 K/uL   RBC 3.89 3.87 - 5.11 MIL/uL   Hemoglobin 11.3 (L) 12.0 - 15.0 g/dL   HCT 34.4 (L) 36.0 - 46.0 %   MCV 88.4 78.0 - 100.0 fL   MCH 29.0 26.0 - 34.0 pg   MCHC 32.8 30.0 - 36.0 g/dL   RDW 13.4 11.5 - 15.5 %   Platelets 224 150 - 400 K/uL  Urinalysis, Routine w reflex microscopic     Status: Abnormal   Collection Time: 07/21/17  1:20 PM  Result Value Ref Range   Color, Urine YELLOW YELLOW   APPearance HAZY (A) CLEAR   Specific Gravity, Urine 1.016 1.005 - 1.030   pH 5.0 5.0 - 8.0   Glucose, UA NEGATIVE NEGATIVE mg/dL   Hgb urine dipstick MODERATE (A) NEGATIVE   Bilirubin Urine NEGATIVE NEGATIVE   Ketones, ur 5 (A) NEGATIVE mg/dL   Protein, ur NEGATIVE NEGATIVE mg/dL   Nitrite POSITIVE (A) NEGATIVE   Leukocytes, UA LARGE (A) NEGATIVE   RBC / HPF 6-30 0 - 5 RBC/hpf   WBC, UA 6-30 0 -  5 WBC/hpf   Bacteria, UA MANY (A) NONE SEEN   Squamous Epithelial / LPF 0-5 (A) NONE SEEN   Mucus PRESENT   I-Stat CG4 Lactic Acid, ED     Status: Abnormal   Collection Time: 07/21/17  8:19 PM  Result Value Ref  Range   Lactic Acid, Venous 2.68 (HH) 0.5 - 1.9 mmol/L   Comment NOTIFIED PHYSICIAN    No results found for this or any previous visit (from the past 240 hour(s)). Creatinine:  Recent Labs  07/21/17 1313  CREATININE 1.05*   Baseline Creatinine: 1  Impression/Assessment:  66yo with left ureteral calculus, sepsis from a urinary source  Plan:  1. The patient will be taken urgently to the OR for left ureteral stent placement. The risks/benefits/alternatives to left ureteral stent placement was explained to the patient and she understands and wishes to proceed with surgery 2. She will be admitted post op for IV antibiotics and fluid rescusitation.   Nicolette Bang 07/21/2017, 9:55 PM

## 2017-07-21 NOTE — Anesthesia Procedure Notes (Signed)
Procedure Name: LMA Insertion Performed by: Cynda Familia Pre-anesthesia Checklist: Patient identified, Emergency Drugs available, Suction available and Patient being monitored Patient Re-evaluated:Patient Re-evaluated prior to induction Oxygen Delivery Method: Circle System Utilized Preoxygenation: Pre-oxygenation with 100% oxygen Induction Type: IV induction Ventilation: Mask ventilation without difficulty LMA: LMA inserted LMA Size: 4.0 Number of attempts: 1 Placement Confirmation: positive ETCO2 Tube secured with: Tape Dental Injury: Teeth and Oropharynx as per pre-operative assessment  Comments: Smooth IV induction [name]--- LMA insertion atraumatic---teeth and mouth as preop--- bilat BS

## 2017-07-21 NOTE — Anesthesia Preprocedure Evaluation (Addendum)
Anesthesia Evaluation  Patient identified by MRN, date of birth, ID band Patient awake    Reviewed: Allergy & Precautions, NPO status , Patient's Chart, lab work & pertinent test results  Airway Mallampati: II  TM Distance: >3 FB Neck ROM: Full    Dental no notable dental hx. (+) Dental Advisory Given   Pulmonary neg pulmonary ROS,    Pulmonary exam normal breath sounds clear to auscultation       Cardiovascular hypertension, Normal cardiovascular exam Rhythm:Regular Rate:Normal     Neuro/Psych negative neurological ROS  negative psych ROS   GI/Hepatic Neg liver ROS, GERD  Medicated,  Endo/Other  negative endocrine ROS  Renal/GU negative Renal ROS  negative genitourinary   Musculoskeletal negative musculoskeletal ROS (+)   Abdominal   Peds negative pediatric ROS (+)  Hematology negative hematology ROS (+)   Anesthesia Other Findings   Reproductive/Obstetrics negative OB ROS                            Anesthesia Physical Anesthesia Plan  ASA: II and emergent  Anesthesia Plan: General   Post-op Pain Management:    Induction: Intravenous  PONV Risk Score and Plan: 2 and Ondansetron and Dexamethasone  Airway Management Planned: LMA  Additional Equipment:   Intra-op Plan:   Post-operative Plan: Extubation in OR  Informed Consent: I have reviewed the patients History and Physical, chart, labs and discussed the procedure including the risks, benefits and alternatives for the proposed anesthesia with the patient or authorized representative who has indicated his/her understanding and acceptance.   Dental advisory given  Plan Discussed with: CRNA and Surgeon  Anesthesia Plan Comments:         Anesthesia Quick Evaluation

## 2017-07-21 NOTE — Anesthesia Procedure Notes (Signed)
Date/Time: 07/21/2017 10:44 PM Performed by: Cynda Familia Oxygen Delivery Method: Simple face mask Dental Injury: Teeth and Oropharynx as per pre-operative assessment  Comments: LMA removed [name] present--- good AW --- mask O 2 to PACU--- O 2 intact VSS

## 2017-07-21 NOTE — ED Provider Notes (Signed)
[facility] DEPT Provider Note   CSN: [id number] Arrival date & time: 07/21/17  1245     History   Chief Complaint Chief Complaint  Patient presents with  . Abdominal Pain  . Emesis    HPI Krista Armstrong is a 66 y.o. female.  66 yo F with a chief complaint of left flank pain. Going on since yesterday. Pain is sharp and shooting. Comes and goes. Worse with movement and palpation twisting. Has had some nausea and vomiting with this. Denies fevers or chills. Denies constipation or diarrhea. Denies vaginal bleeding or discharge. Patient never had pain like this before. She has had a prior abdominal surgery for a gist tumor. She has been treated for breast cancer but has been cleared about 2 years ago. She recently had some cysts removed from her uterus. Denies similar symptoms with this.   The history is provided by the patient and the spouse.  Abdominal Pain   This is a new problem. The current episode started yesterday. The problem occurs constantly. The problem has been gradually worsening. The pain is associated with an unknown factor. The pain is located in the LLQ. The quality of the pain is sharp and shooting. The pain is at a severity of 10/10. The pain is severe. Associated symptoms include nausea and vomiting. Pertinent negatives include fever, dysuria, headaches, arthralgias and myalgias. The symptoms are aggravated by certain positions and palpation. Nothing relieves the symptoms.    Past Medical History:  Diagnosis Date  . Abnormal uterine bleeding   . 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 Chronic back pain   . Colon polyps   . Family history of breast cancer   . GERD (gastroesophageal reflux disease)   . Hot flashes   . Hypertension   . Radiation 07/07/15-08/09/15   Left breast  . Ruptured lumbar disc   . Transfusion history   . Wears glasses     Patient Active Problem List   Diagnosis Date Noted  . Sepsis ([place]) 07/21/2017  .  Essential hypertension 11/22/2016  . Gastroesophageal reflux disease 11/22/2016  . Genetic testing 09/06/2015  . Family history of breast cancer   . Malignant neoplasm of lower-outer quadrant of left breast of female, estrogen receptor positive ([place]) 05/06/2015    Past Surgical History:  Procedure Laterality Date  . BACK SURGERY  2000, 2009  . BACK SURGERY    . BREAST BIOPSY Left 05/03/2015   U/S Core- Benign  . BREAST BIOPSY Right    U/S Core- Benign  . BREAST LUMPECTOMY Left 05/30/2015  . BREAST SURGERY    . colon polyps    . DILATATION & CURETTAGE/HYSTEROSCOPY WITH MYOSURE N/A 02/05/2017   Procedure: DILATATION & CURETTAGE/HYSTEROSCOPY WITH MYOSURE;  Surgeon: [name], MD;  Location: [facility];  Service: Gynecology;  Laterality: N/A;  . RADIOACTIVE SEED GUIDED MASTECTOMY WITH AXILLARY SENTINEL LYMPH NODE BIOPSY Left 05/30/2015   Procedure: LEFT PARTIAL MASTECTOMY WITH RADIOACTIVE SEED LOCALIZATION, LEFT AXILLARY SENTINEL LYMPH NODE BIOPSY;  Surgeon: Fanny Skates, MD;  Location: [facility];  Service: General;  Laterality: Left;  . ruptured lumbar disc    . STOMACH SURGERY  2006   tumors excised    OB History    Gravida Para Term Preterm AB Living   3 3 3     3    SAB TAB Ectopic Multiple Live Births           3  Home Medications    Prior to Admission medications   Medication Sig Start Date End Date Taking? Authorizing Provider  amLODipine (NORVASC) 5 MG tablet TAKE 1 TABLET (5 MG TOTAL) BY MOUTH DAILY. 11/23/16  Yes Colin Benton R, DO  Biotin 1 MG CAPS Take 1 mg by mouth daily.    Yes [provider]  esomeprazole (NEXIUM) 40 MG capsule Take 1 capsule (40 mg total) by mouth daily. 11/17/15  Yes Colin Benton R, DO  gabapentin (NEURONTIN) 300 MG capsule Take 2 capsules (600 mg total) by mouth at bedtime. 06/04/17  Yes Magrinat, Virgie Dad, MD  losartan (COZAAR) 50 MG tablet TAKE 1 TABLET (50 MG TOTAL) BY MOUTH DAILY. *MAX  DAY SUPPLY PER INSURANCE* 11/23/16  Yes Lucretia Kern, DO  tamoxifen (NOLVADEX) 20 MG tablet Take 1 tablet (20 mg total) by mouth daily. 05/28/17  Yes Magrinat, Virgie Dad, MD    Family History Family History  Problem Relation Age of Onset  . Breast cancer Mother 15  . Stroke Father   . Heart disease Maternal Grandmother   . Cervical cancer Maternal Aunt   . Colon cancer Neg Hx   . Colon polyps Neg Hx   . Diabetes Neg Hx   . Kidney disease Neg Hx   . Esophageal cancer Neg Hx   . Gallbladder disease Neg Hx     Social History Social History  Substance Use Topics  . Smoking status: Never Smoker  . Smokeless tobacco: Never Used  . Alcohol use 4.2 oz/week    7 Glasses of wine per week     Comment: wine every  night      Allergies   Vicodin [hydrocodone-acetaminophen]   Review of Systems Review of Systems  Constitutional: Negative for chills and fever.  HENT: Negative for congestion and rhinorrhea.   Eyes: Negative for redness and visual disturbance.  Respiratory: Negative for shortness of breath and wheezing.   Cardiovascular: Negative for chest pain and palpitations.  Gastrointestinal: Positive for abdominal pain, nausea and vomiting.  Genitourinary: Negative for dysuria and urgency.  Musculoskeletal: Negative for arthralgias and myalgias.  Skin: Negative for pallor and wound.  Neurological: Negative for dizziness and headaches.     Physical Exam Updated Vital Signs BP 122/74 (BP Location: Left Arm)   Pulse (!) 117   Temp 100.2 F (37.9 C)   Resp 17   Ht 5\' 6"  (1.676 m)   Wt 72.6 kg (160 lb)   SpO2 95%   BMI 25.82 kg/m   Physical Exam  Constitutional: She is oriented to person, place, and time. She appears well-developed and well-nourished. No distress.  HENT:  Head: Normocephalic and atraumatic.  Eyes: Pupils are equal, round, and reactive to light. EOM are normal.  Neck: Normal range of motion. Neck supple.  Cardiovascular: Normal rate and regular rhythm.   Exam reveals no gallop and no friction rub.   No murmur heard. Pulmonary/Chest: Effort normal. She has no wheezes. She has no rales.  Abdominal: Soft. She exhibits no distension and no mass. There is tenderness (diffuse). There is guarding.  Musculoskeletal: She exhibits no edema or tenderness.  Neurological: She is alert and oriented to person, place, and time.  Skin: Skin is warm and dry. She is not diaphoretic.  Psychiatric: She has a normal mood and affect. Her behavior is normal.  Nursing note and vitals reviewed.    ED Treatments / Results  Labs (all labs ordered are listed, but only abnormal results are displayed)  Labs Reviewed  COMPREHENSIVE METABOLIC PANEL - Abnormal; Notable for the following:       Result Value   Glucose, Bld 130 (*)    Creatinine, Ser 1.05 (*)    Calcium 8.6 (*)    Alkaline Phosphatase 28 (*)    GFR calc non Af Amer 55 (*)    All other components within normal limits  CBC - Abnormal; Notable for the following:    WBC 13.4 (*)    Hemoglobin 11.3 (*)    HCT 34.4 (*)    All other components within normal limits  URINALYSIS, ROUTINE W REFLEX MICROSCOPIC - Abnormal; Notable for the following:    APPearance HAZY (*)    Hgb urine dipstick MODERATE (*)    Ketones, ur 5 (*)    Nitrite POSITIVE (*)    Leukocytes, UA LARGE (*)    Bacteria, UA MANY (*)    Squamous Epithelial / LPF 0-5 (*)    All other components within normal limits  I-STAT CG4 LACTIC ACID, ED - Abnormal; Notable for the following:    Lactic Acid, Venous 2.68 (*)    All other components within normal limits  LIPASE, BLOOD  CBC  BASIC METABOLIC PANEL  CBC  BASIC METABOLIC PANEL    EKG  EKG Interpretation None       Radiology Dg C-arm 1-60 Min-no Report  Result Date: 07/21/2017 Fluoroscopy was utilized by the requesting physician.  No radiographic interpretation.   Ct Renal Stone Study  Result Date: 07/21/2017 CLINICAL DATA:  LEFT-sided flank pain which began in the night  EXAM: CT ABDOMEN AND PELVIS WITHOUT CONTRAST TECHNIQUE: Multidetector CT imaging of the abdomen and pelvis was performed following the standard protocol without IV contrast. COMPARISON:  None. FINDINGS: Lower chest: Lung bases are clear. Hepatobiliary: No focal hepatic lesion. Postcholecystectomy. No biliary dilatation. Pancreas: Pancreas is normal. No ductal dilatation. No pancreatic inflammation. Spleen: Normal spleen Adrenals/urinary tract: Adrenal glands normal. There is LEFT renal edema, perinephric stranding and mild hydronephrosis. There is an obstructing calculus in the proximal LEFT ureter measuring 9 mm in axial dimension (image 42, series 3). This calculus is seen on the CT topogram at the L3 vertebral body level measuring 13 mm. No bladder calculi. No RIGHT renal calculi. Stomach/Bowel: Post bariatric surgery. Stomach, small-bowel and cecum normal. Colon and rectosigmoid colon are normal. Vascular/Lymphatic: Abdominal aorta is normal caliber. There is no retroperitoneal or periportal lymphadenopathy. No pelvic lymphadenopathy. Reproductive: Uterus is normal.  Ovaries are normal. Other: No free fluid. Musculoskeletal: No aggressive osseous lesion. IMPRESSION: 1. Large obstructing calculus in the proximal LEFT ureter. Calculus clearly evident on the CT topogram. 2. Post bariatric surgery. Electronically Signed   By: Suzy Bouchard M.D.   On: 07/21/2017 17:11    Procedures Procedures (including critical care time)  Medications Ordered in ED Medications  phenylephrine (NEOSYNEPHRINE) 10-0.9 MG/250ML-% infusion (not administered)  gabapentin (NEURONTIN) capsule 600 mg (600 mg Oral Not Given 07/22/17 0000)  tamoxifen (NOLVADEX) tablet 20 mg (not administered)  pantoprazole (PROTONIX) EC tablet 40 mg (not administered)  0.9 %  sodium chloride infusion ( Intravenous New Bag/Given 07/22/17 0000)  cefTRIAXone (ROCEPHIN) 2 g in dextrose 5 % 50 mL IVPB (not administered)  oxyCODONE-acetaminophen  (PERCOCET/ROXICET) 5-325 MG per tablet 1-2 tablet (not administered)  HYDROmorphone (DILAUDID) injection 0.5-1 mg (not administered)  hyoscyamine (LEVSIN SL) SL tablet 0.125 mg (not administered)  zolpidem (AMBIEN) tablet 5 mg (not administered)  diphenhydrAMINE (BENADRYL) injection 12.5 mg (not administered)  Or  diphenhydrAMINE (BENADRYL) 12.5 MG/5ML elixir 12.5 mg (not administered)  ondansetron (ZOFRAN) injection 4 mg (not administered)  morphine 4 MG/ML injection 4 mg (4 mg Intravenous Given 07/21/17 1546)  ondansetron (ZOFRAN) injection 4 mg (4 mg Intravenous Given 07/21/17 1544)  sodium chloride 0.9 % bolus 1,000 mL (0 mLs Intravenous Stopped 07/21/17 1637)  prochlorperazine (COMPAZINE) injection 10 mg (10 mg Intravenous Given 07/21/17 1651)  diphenhydrAMINE (BENADRYL) injection 25 mg (25 mg Intravenous Given 07/21/17 1651)  morphine 4 MG/ML injection 8 mg (8 mg Intravenous Given 07/21/17 1759)  cefTRIAXone (ROCEPHIN) 1 g in dextrose 5 % 50 mL IVPB (0 g Intravenous Stopped 07/21/17 2047)  acetaminophen (TYLENOL) tablet 1,000 mg (1,000 mg Oral Given 07/21/17 2017)  sodium chloride 0.9 % bolus 1,000 mL (1,000 mLs Intravenous New Bag/Given 07/21/17 2017)  dextrose 5 % with cefTRIAXone (ROCEPHIN) ADS Med (  Override pull for Anesthesia 07/21/17 2228)     Initial Impression / Assessment and Plan / ED Course  I have reviewed the triage vital signs and the nursing notes.  Pertinent labs & imaging results that were available during my care of the patient were reviewed by me and considered in my medical decision making (see chart for details).     66 yo F With a chief complaints of left flank pain. Labs consistent with UTI. Suspect that this is pyelonephritis. However patient has diffuse tenderness to palpation of the abdomen. Also seems fairly severe on exam. Will obtain a CT scan to further evaluate.  CT with large obstructing stone to the left proximal ureter. Radiology estimates and 1 view at 9 mm and  another it's 13. With a likely infected urine will discuss with urology. Pain at an 8.   With patient's elevated white blood cell count, CT imaging and urine urology is concerned as well that this may be infected. We'll transfer the patient to [facility] for likely stent placement.  Patient transport delayed. At that time she became febrile and her blood pressure has dropped mildly. She is given antibiotics 2 boluses of IV fluids with some improvement.  CRITICAL CARE Performed by: Cecilio Asper   Total critical care time: 80 minutes  Critical care time was exclusive of separately billable procedures and treating other patients.  Critical care was necessary to treat or prevent imminent or life-threatening deterioration.  Critical care was time spent personally by me on the following activities: development of treatment plan with patient and/or surrogate as well as nursing, discussions with consultants, evaluation of patient's response to treatment, examination of patient, obtaining history from patient or surrogate, ordering and performing treatments and interventions, ordering and review of laboratory studies, ordering and review of radiographic studies, pulse oximetry and re-evaluation of patient's condition.  The patients results and plan were reviewed and discussed.   Any x-rays performed were independently reviewed by myself.   Differential diagnosis were considered with the presenting HPI.  Medications  phenylephrine (NEOSYNEPHRINE) 10-0.9 MG/250ML-% infusion (not administered)  gabapentin (NEURONTIN) capsule 600 mg (600 mg Oral Not Given 07/22/17 0000)  tamoxifen (NOLVADEX) tablet 20 mg (not administered)  pantoprazole (PROTONIX) EC tablet 40 mg (not administered)  0.9 %  sodium chloride infusion ( Intravenous New Bag/Given 07/22/17 0000)  cefTRIAXone (ROCEPHIN) 2 g in dextrose 5 % 50 mL IVPB (not administered)  oxyCODONE-acetaminophen (PERCOCET/ROXICET) 5-325 MG per tablet  1-2 tablet (not administered)  HYDROmorphone (DILAUDID) injection 0.5-1 mg (not administered)  hyoscyamine (LEVSIN SL) SL tablet 0.125 mg (not administered)  zolpidem (  AMBIEN) tablet 5 mg (not administered)  diphenhydrAMINE (BENADRYL) injection 12.5 mg (not administered)    Or  diphenhydrAMINE (BENADRYL) 12.5 MG/5ML elixir 12.5 mg (not administered)  ondansetron (ZOFRAN) injection 4 mg (not administered)  morphine 4 MG/ML injection 4 mg (4 mg Intravenous Given 07/21/17 1546)  ondansetron (ZOFRAN) injection 4 mg (4 mg Intravenous Given 07/21/17 1544)  sodium chloride 0.9 % bolus 1,000 mL (0 mLs Intravenous Stopped 07/21/17 1637)  prochlorperazine (COMPAZINE) injection 10 mg (10 mg Intravenous Given 07/21/17 1651)  diphenhydrAMINE (BENADRYL) injection 25 mg (25 mg Intravenous Given 07/21/17 1651)  morphine 4 MG/ML injection 8 mg (8 mg Intravenous Given 07/21/17 1759)  cefTRIAXone (ROCEPHIN) 1 g in dextrose 5 % 50 mL IVPB (0 g Intravenous Stopped 07/21/17 2047)  acetaminophen (TYLENOL) tablet 1,000 mg (1,000 mg Oral Given 07/21/17 2017)  sodium chloride 0.9 % bolus 1,000 mL (1,000 mLs Intravenous New Bag/Given 07/21/17 2017)  dextrose 5 % with cefTRIAXone (ROCEPHIN) ADS Med (  Override pull for Anesthesia 07/21/17 2228)    Vitals:   07/21/17 2315 07/21/17 2328 07/21/17 2330 07/21/17 2345  BP: 122/78 124/76 119/74 122/74  Pulse: (!) 116 (!) 118 (!) 117 (!) 117  Resp: 18 19 17    Temp:   100.2 F (37.9 C)   SpO2: 100% 96% 94% 95%  Weight:      Height:        Final diagnoses:  Nephrolithiasis  Acute pyelonephritis  Severe sepsis [facility])      Final Clinical Impressions(s) / ED Diagnoses   Final diagnoses:  Nephrolithiasis  Acute pyelonephritis  Severe sepsis [facility])    New Prescriptions Current Discharge Medication List       Deno Etienne, DO 07/22/17 0000

## 2017-07-21 NOTE — ED Notes (Signed)
ED Provider at bedside. 

## 2017-07-21 NOTE — ED Triage Notes (Signed)
Pt. Stated, I've had stomach pain that woke me up early this morning.  Ive also had N/V

## 2017-07-21 NOTE — ED Notes (Signed)
THE PT RETURNED FROM C-T  SHE REPORTS NO CHANGE IN HER HEADACHE

## 2017-07-21 NOTE — ED Notes (Signed)
Pt  C/o abd pain with n v and diarrhea  Since 0200am today  With a headache no previous history

## 2017-07-21 NOTE — ED Notes (Signed)
TO CT

## 2017-07-21 NOTE — Op Note (Signed)
.  Preoperative diagnosis: Left ureteral stone, sepsis  Postoperative diagnosis: Same  Procedure: 1 cystoscopy 2. Left retrograde pyelography 3.  Intraoperative fluoroscopy, under one hour, with interpretation 4. Left 6 x 26 JJ stent placement  Attending: Nicolette Bang  Anesthesia: General  Estimated blood loss: None  Drains: Left 6 x 26 JJ ureteral stent without tether, 16 French foley catheter  Specimens: urine for culture  Antibiotics: rocephin  Findings: left proximal ureteral stone. Moderate hydronephrosis. No masses/lesions in the bladder. Ureteral orifices in normal anatomic location.  Indications: Patient is a 66 year old female with a history of left ureteral stone and concern for sepsis.  After discussing treatment options, they decided proceed with left stent placement.  Procedure her in detail: The patient was brought to the operating room and a brief timeout was done to ensure correct patient, correct procedure, correct site.  General anesthesia was administered patient was placed in dorsal lithotomy position.  Their genitalia was then prepped and draped in usual sterile fashion.  A rigid 21 French cystoscope was passed in the urethra and the bladder.  Bladder was inspected free masses or lesions.  the ureteral orifices were in the normal orthotopic locations.  a 6 french ureteral catheter was then instilled into the left ureteral orifice.  a gentle retrograde was obtained and findings noted above.  we then placed a zip wire through the ureteral catheter and advanced up to the renal pelvis.    We then placed a 6 x 26 double-j ureteral stent over the original zip wire.  We then removed the wire and good coil was noted in the the renal pelvis under fluoroscopy and the bladder under direct vision.  A foley catheter was then placed. the bladder was then drained and this concluded the procedure which was well tolerated by patient.  Complications: None  Condition: Stable,  extubated, transferred to PACU  Plan: Patient is to be admitted for IV antibiotics. She will have her stone extraction in 2 weeks.

## 2017-07-22 ENCOUNTER — Inpatient Hospital Stay (HOSPITAL_COMMUNITY): Payer: 59

## 2017-07-22 DIAGNOSIS — A419 Sepsis, unspecified organism: Principal | ICD-10-CM

## 2017-07-22 LAB — BASIC METABOLIC PANEL
ANION GAP: 10 (ref 5–15)
ANION GAP: 8 (ref 5–15)
BUN: 15 mg/dL (ref 6–20)
BUN: 15 mg/dL (ref 6–20)
CHLORIDE: 110 mmol/L (ref 101–111)
CHLORIDE: 110 mmol/L (ref 101–111)
CO2: 20 mmol/L — ABNORMAL LOW (ref 22–32)
CO2: 21 mmol/L — AB (ref 22–32)
Calcium: 7.5 mg/dL — ABNORMAL LOW (ref 8.9–10.3)
Calcium: 7.4 mg/dL — ABNORMAL LOW (ref 8.9–10.3)
Creatinine, Ser: 1.05 mg/dL — ABNORMAL HIGH (ref 0.44–1.00)
Creatinine, Ser: 0.98 mg/dL (ref 0.44–1.00)
GFR calc Af Amer: >60 mL/min (ref >60)
GFR calc non Af Amer: 59 mL/min — ABNORMAL LOW (ref >60)
GFR, EST NON AFRICAN AMERICAN: 55 mL/min — AB (ref >60)
GLUCOSE: 114 mg/dL — AB (ref 65–99)
GLUCOSE: 142 mg/dL — AB (ref 65–99)
POTASSIUM: 3.3 mmol/L — AB (ref 3.5–5.1)
Potassium: 3 mmol/L — ABNORMAL LOW (ref 3.5–5.1)
Sodium: 140 mmol/L (ref 135–145)
Sodium: 139 mmol/L (ref 135–145)

## 2017-07-22 LAB — LACTIC ACID, PLASMA
LACTIC ACID, VENOUS: 3.7 mmol/L — AB (ref 0.5–1.9)
LACTIC ACID, VENOUS: 3.7 mmol/L — AB (ref 0.5–1.9)
Lactic Acid, Venous: 1.4 mmol/L (ref 0.5–1.9)

## 2017-07-22 LAB — MRSA PCR SCREENING: MRSA by PCR: POSITIVE — AB

## 2017-07-22 LAB — CBC
HCT: 29.4 % — ABNORMAL LOW (ref 36.0–46.0)
HEMATOCRIT: 29.5 % — AB (ref 36.0–46.0)
HEMOGLOBIN: 9.7 g/dL — AB (ref 12.0–15.0)
Hemoglobin: 9.9 g/dL — ABNORMAL LOW (ref 12.0–15.0)
MCH: 29.9 pg (ref 26.0–34.0)
MCH: 29.2 pg (ref 26.0–34.0)
MCHC: 33.6 g/dL (ref 30.0–36.0)
MCHC: 33 g/dL (ref 30.0–36.0)
MCV: 89.1 fL (ref 78.0–100.0)
MCV: 88.6 fL (ref 78.0–100.0)
PLATELETS: 136 10*3/uL — AB (ref 150–400)
Platelets: 139 10*3/uL — ABNORMAL LOW (ref 150–400)
RBC: 3.31 MIL/uL — AB (ref 3.87–5.11)
RBC: 3.32 MIL/uL — ABNORMAL LOW (ref 3.87–5.11)
RDW: 13.9 % (ref 11.5–15.5)
RDW: 13.8 % (ref 11.5–15.5)
WBC: 4.8 10*3/uL (ref 4.0–10.5)
WBC: 10 10*3/uL (ref 4.0–10.5)

## 2017-07-22 MED ORDER — MUPIROCIN 2 % EX OINT
1.0000 "application " | TOPICAL_OINTMENT | Freq: Two times a day (BID) | CUTANEOUS | Status: DC
  Administered 2017-07-22 – 2017-07-25 (×7): 1 via NASAL
  Filled 2017-07-22 (×2): qty 22

## 2017-07-22 MED ORDER — SODIUM CHLORIDE 0.9 % IV SOLN
INTRAVENOUS | Status: DC
  Administered 2017-07-22: 07:00:00 via INTRAVENOUS

## 2017-07-22 MED ORDER — CHLORHEXIDINE GLUCONATE CLOTH 2 % EX PADS
6.0000 | MEDICATED_PAD | Freq: Every day | CUTANEOUS | Status: DC
  Administered 2017-07-22 – 2017-07-25 (×4): 6 via TOPICAL

## 2017-07-22 MED ORDER — SODIUM CHLORIDE 0.9 % IV BOLUS (SEPSIS)
1000.0000 mL | Freq: Once | INTRAVENOUS | Status: AC
  Administered 2017-07-22: 1000 mL via INTRAVENOUS

## 2017-07-22 MED ORDER — POTASSIUM CHLORIDE 20 MEQ/15ML (10%) PO SOLN
40.0000 meq | Freq: Two times a day (BID) | ORAL | Status: AC
  Administered 2017-07-22 (×2): 40 meq
  Filled 2017-07-22 (×2): qty 30

## 2017-07-22 NOTE — Progress Notes (Signed)
Potassium 3.0. Notified Mckenzie. No further orders.

## 2017-07-22 NOTE — Progress Notes (Signed)
CRITICAL VALUE ALERT  Critical Value:  Lactic Acid 3.7  Date & Time Notied: 07/22/17;  10:39   Provider Notified: yes, Mckenzie   Orders Received/Actions taken: 1 NS L Bolus

## 2017-07-22 NOTE — Progress Notes (Signed)
Pt bp dropped to 85/50. MD oncall informed and ordered 1L NS bolus. Will continue to monitor.

## 2017-07-22 NOTE — Care Management Note (Signed)
Case Management Note  Patient Details  Name: Gioia Ranes MRN: [id number] Date of Birth: 12/08/1950  Subjective/Objective:        Left ureteral stone with sepsis            Action/Plan: Date:  July 22, 2017 Chart reviewed for concurrent status and case management needs. Will continue to follow patient progress. Discharge Planning: following for needs Expected discharge date: [id number] Velva Harman, BSN, [place], [place]  Expected Discharge Date:                  Expected Discharge Plan:  Home/Self Care  In-House Referral:     Discharge planning Services  CM Consult  Post Acute Care Choice:    Choice offered to:     DME Arranged:    DME Agency:     HH Arranged:    HH Agency:     Status of Service:  In process, will continue to follow  If discussed at Long Length of Stay Meetings, dates discussed:    Additional Comments:  Leeroy Cha, RN 07/22/2017, 8:53 AM

## 2017-07-22 NOTE — Progress Notes (Signed)
1 Day Post-Op Subjective: Patient reports resolution of left flank pain. S/p L stent placement for left ureteral calculus and sepsis. Pt required 1 NS bolus overnight for hypotension  Objective: Vital signs in last 24 hours: Temp:  [97.8 F (36.6 C)-102.9 F (39.4 C)] 97.8 F (36.6 C) (09/03 0751) Pulse Rate:  [87-133] 87 (09/03 0900) Resp:  [14-26] 16 (09/03 0900) BP: (79-143)/(45-87) 80/59 (09/03 0900) SpO2:  [84 %-100 %] 96 % (09/03 0900) Weight:  [72.4 kg (159 lb 9.8 oz)-72.6 kg (160 lb)] 72.4 kg (159 lb 9.8 oz) (09/02 2345)  Intake/Output from previous day: 09/02 0701 - 09/03 0700 In: 1625 [I.V.:1625] Out: 1200 [Urine:1200] Intake/Output this shift: Total I/O In: -  Out: 300 [Urine:300]  Physical Exam:  General:alert, cooperative and appears stated age GI: soft, non tender, normal bowel sounds, no palpable masses, no organomegaly, no inguinal hernia Female genitalia: not done Extremities: extremities normal, atraumatic, no cyanosis or edema  Lab Results:  Recent Labs  07/21/17 1313 07/22/17 0102 07/22/17 0316  HGB 11.3* 9.9* 9.7*  HCT 34.4* 29.5* 29.4*   BMET  Recent Labs  07/22/17 0102 07/22/17 0316  NA 140 139  K 3.3* 3.0*  CL 110 110  CO2 20* 21*  GLUCOSE 114* 142*  BUN 15 15  CREATININE 1.05* 0.98  CALCIUM 7.5* 7.4*   No results for input(s): LABPT, INR in the last 72 hours. No results for input(s): LABURIN in the last 72 hours. Results for orders placed or performed during the hospital encounter of 07/21/17  MRSA PCR Screening     Status: Abnormal   Collection Time: 07/22/17 12:00 AM  Result Value Ref Range Status   MRSA by PCR POSITIVE (A) NEGATIVE Final    Comment:        The GeneXpert MRSA Assay (FDA approved for NASAL specimens only), is one component of a comprehensive MRSA colonization surveillance program. It is not intended to diagnose MRSA infection nor to guide or monitor treatment for MRSA infections. RESULT CALLED TO, READ  BACK BY AND VERIFIED WITH: Rosaria Ferries [id number] @ 0203 BY J SCOTTON     Studies/Results: Dg C-arm 1-60 Min-no Report  Result Date: 07/21/2017 Fluoroscopy was utilized by the requesting physician.  No radiographic interpretation.   Ct Renal Stone Study  Result Date: 07/21/2017 CLINICAL DATA:  LEFT-sided flank pain which began in the night EXAM: CT ABDOMEN AND PELVIS WITHOUT CONTRAST TECHNIQUE: Multidetector CT imaging of the abdomen and pelvis was performed following the standard protocol without IV contrast. COMPARISON:  None. FINDINGS: Lower chest: Lung bases are clear. Hepatobiliary: No focal hepatic lesion. Postcholecystectomy. No biliary dilatation. Pancreas: Pancreas is normal. No ductal dilatation. No pancreatic inflammation. Spleen: Normal spleen Adrenals/urinary tract: Adrenal glands normal. There is LEFT renal edema, perinephric stranding and mild hydronephrosis. There is an obstructing calculus in the proximal LEFT ureter measuring 9 mm in axial dimension (image 42, series 3). This calculus is seen on the CT topogram at the L3 vertebral body level measuring 13 mm. No bladder calculi. No RIGHT renal calculi. Stomach/Bowel: Post bariatric surgery. Stomach, small-bowel and cecum normal. Colon and rectosigmoid colon are normal. Vascular/Lymphatic: Abdominal aorta is normal caliber. There is no retroperitoneal or periportal lymphadenopathy. No pelvic lymphadenopathy. Reproductive: Uterus is normal.  Ovaries are normal. Other: No free fluid. Musculoskeletal: No aggressive osseous lesion. IMPRESSION: 1. Large obstructing calculus in the proximal LEFT ureter. Calculus clearly evident on the CT topogram. 2. Post bariatric surgery. Electronically Signed   By: Suzy Bouchard  M.D.   On: 07/21/2017 17:11    Assessment/Plan: 66yo with left ureteral stone and sepsis from a urinary source  1. NS bolus this morning 2. Continue rocephin 3. Continue ICU   LOS: 1 day   Nicolette Bang 07/22/2017,  10:12 AM

## 2017-07-22 NOTE — Progress Notes (Signed)
CRITICAL VALUE ALERT  Critical Value:  Lactic Acid 3.7  Date & Time Notied: 07/22/17     1437  Provider Notified:  Yes; Mckenzie  Orders Received/Actions taken: Pulmonary consult with [facility]

## 2017-07-22 NOTE — Consult Note (Addendum)
PULMONARY / CRITICAL CARE MEDICINE   Name: Krista Armstrong MRN: [id number] DOB: [birth year]    ADMISSION DATE:  07/21/2017 CONSULTATION DATE:  07/22/17  REFERRING MD:  Rosie Fate MD  CHIEF COMPLAINT:  Urosepsis, elevated lactic acid  HISTORY OF PRESENT ILLNESS:   66 year old with history of breast cancer, chronic back pain, GERD, hypertension admitted with left ureteral stone, sepsis. She underwent cystoscopy and stent placement on 9/2. She had episode of hypotension on 9/3 requiring fluid boluses but has persistently elevated lactic acid and PCCM consulted for help with further management.  PAST MEDICAL HISTORY :  She  has a past medical history of Abnormal uterine bleeding; Breast cancer ([place]) (05/03/15); Chronic back pain; Colon polyps; Family history of breast cancer; GERD (gastroesophageal reflux disease); Hot flashes; Hypertension; Radiation (07/07/15-08/09/15); Ruptured lumbar disc; Transfusion history; and Wears glasses.  PAST SURGICAL HISTORY: She  has a past surgical history that includes Back surgery (2000, 2009); Stomach surgery (2006); Back surgery; Radioactive seed guided mastectomy with axillary sentinel lymph node biopsy (Left, 05/30/2015); ruptured lumbar disc; colon polyps; Breast surgery; Dilatation & curettage/hysteroscopy with myosure (N/A, 02/05/2017); Breast biopsy (Left, 05/03/2015); Breast biopsy (Right); and Breast lumpectomy (Left, 05/30/2015).  Allergies  Allergen Reactions  . Vicodin [Hydrocodone-Acetaminophen] Other (See Comments)    Massive headache    No current facility-administered medications on file prior to encounter.    Current Outpatient Prescriptions on File Prior to Encounter  Medication Sig  . amLODipine (NORVASC) 5 MG tablet TAKE 1 TABLET (5 MG TOTAL) BY MOUTH DAILY.  Marland Kitchen Biotin 1 MG CAPS Take 1 mg by mouth daily.   Marland Kitchen esomeprazole (NEXIUM) 40 MG capsule Take 1 capsule (40 mg total) by mouth daily.  Marland Kitchen gabapentin (NEURONTIN) 300 MG capsule Take 2  capsules (600 mg total) by mouth at bedtime.  Marland Kitchen losartan (COZAAR) 50 MG tablet TAKE 1 TABLET (50 MG TOTAL) BY MOUTH DAILY. *MAX DAY SUPPLY PER INSURANCE*  . tamoxifen (NOLVADEX) 20 MG tablet Take 1 tablet (20 mg total) by mouth daily.    FAMILY HISTORY:  Her indicated that her mother is deceased. She indicated that her father is deceased. She indicated that her sister is alive. She indicated that her maternal grandmother is deceased. She indicated that her maternal grandfather is deceased. She indicated that her paternal grandmother is deceased. She indicated that her paternal grandfather is deceased. She indicated that the status of her maternal aunt is unknown. She indicated that the status of her neg hx is unknown.    SOCIAL HISTORY: She  reports that she has never smoked. She has never used smokeless tobacco. She reports that she drinks about 4.2 oz of alcohol per week . She reports that she does not use drugs.  REVIEW OF SYSTEMS:     All negative; except for those that are bolded, which indicate positives.  Constitutional: weight loss, weight gain, night sweats, fevers, chills, fatigue, weakness.  HEENT: headaches, sore throat, sneezing, nasal congestion, post nasal drip, difficulty swallowing, tooth/dental problems, visual complaints, visual changes, ear aches. Neuro: difficulty with speech, weakness, numbness, ataxia. CV:  chest pain, orthopnea, PND, swelling in lower extremities, dizziness, palpitations, syncope.  Resp: cough, hemoptysis, dyspnea, wheezing. GI: heartburn, indigestion, abdominal pain, nausea, vomiting, diarrhea, constipation, change in bowel habits, loss of appetite, hematemesis, melena, hematochezia.  GU: dysuria, change in color of urine, urgency or frequency, flank pain, hematuria. MSK: joint pain or swelling, decreased range of motion. Psych: change in mood or affect, depression, anxiety, suicidal ideations, homicidal ideations.  Skin: rash, itching,  bruising.  SUBJECTIVE:    VITAL SIGNS: BP 102/66   Pulse 95   Temp 98.4 F (36.9 C) (Oral)   Resp (!) 22   Ht 5\' 6"  (1.676 m)   Wt 159 lb 9.8 oz (72.4 kg)   SpO2 96%   BMI 25.76 kg/m   HEMODYNAMICS:    VENTILATOR SETTINGS:    INTAKE / OUTPUT: I/O last 3 completed shifts: In: 4098 [I.V.:1625] Out: 1200 [Urine:1200]  PHYSICAL EXAMINATION: Blood pressure 102/66, pulse 95, temperature 98.4 F (36.9 C), temperature source Oral, resp. rate (!) 22, height 5\' 6"  (1.676 m), weight 159 lb 9.8 oz (72.4 kg), SpO2 96 %. Gen:      No acute distress HEENT:  EOMI, sclera anicteric Neck:     No masses; no thyromegaly Lungs:    Clear to auscultation bilaterally; normal respiratory effort CV:         Regular rate and rhythm; no murmurs Abd:      + bowel sounds; soft, non-tender; no palpable masses, no distension Ext:    No edema; adequate peripheral perfusion Skin:      Warm and dry; no rash Neuro: alert and oriented x 3 Psych: normal mood and affect  LABS:  BMET  Recent Labs Lab 07/21/17 1313 07/22/17 0102 07/22/17 0316  NA 139 140 139  K 3.7 3.3* 3.0*  CL 105 110 110  CO2 22 20* 21*  BUN 12 15 15   CREATININE 1.05* 1.05* 0.98  GLUCOSE 130* 114* 142*    Electrolytes  Recent Labs Lab 07/21/17 1313 07/22/17 0102 07/22/17 0316  CALCIUM 8.6* 7.5* 7.4*    CBC  Recent Labs Lab 07/21/17 1313 07/22/17 0102 07/22/17 0316  WBC 13.4* 4.8 10.0  HGB 11.3* 9.9* 9.7*  HCT 34.4* 29.5* 29.4*  PLT 224 136* 139*    Coag's No results for input(s): APTT, INR in the last 168 hours.  Sepsis Markers  Recent Labs Lab 07/21/17 2019 07/22/17 0940 07/22/17 1351  LATICACIDVEN 2.68* 3.7* 3.7*    ABG No results for input(s): PHART, PCO2ART, PO2ART in the last 168 hours.  Liver Enzymes  Recent Labs Lab 07/21/17 1313  AST 32  ALT 19  ALKPHOS 28*  BILITOT 0.7  ALBUMIN 3.8    Cardiac Enzymes No results for input(s): TROPONINI, PROBNP in the last 168  hours.  Glucose No results for input(s): GLUCAP in the last 168 hours.  Imaging Dg C-arm 1-60 Min-no Report  Result Date: 07/21/2017 Fluoroscopy was utilized by the requesting physician.  No radiographic interpretation.   Ct Renal Stone Study  Result Date: 07/21/2017 CLINICAL DATA:  LEFT-sided flank pain which began in the night EXAM: CT ABDOMEN AND PELVIS WITHOUT CONTRAST TECHNIQUE: Multidetector CT imaging of the abdomen and pelvis was performed following the standard protocol without IV contrast. COMPARISON:  None. FINDINGS: Lower chest: Lung bases are clear. Hepatobiliary: No focal hepatic lesion. Postcholecystectomy. No biliary dilatation. Pancreas: Pancreas is normal. No ductal dilatation. No pancreatic inflammation. Spleen: Normal spleen Adrenals/urinary tract: Adrenal glands normal. There is LEFT renal edema, perinephric stranding and mild hydronephrosis. There is an obstructing calculus in the proximal LEFT ureter measuring 9 mm in axial dimension (image 42, series 3). This calculus is seen on the CT topogram at the L3 vertebral body level measuring 13 mm. No bladder calculi. No RIGHT renal calculi. Stomach/Bowel: Post bariatric surgery. Stomach, small-bowel and cecum normal. Colon and rectosigmoid colon are normal. Vascular/Lymphatic: Abdominal aorta is normal caliber. There is no retroperitoneal  or periportal lymphadenopathy. No pelvic lymphadenopathy. Reproductive: Uterus is normal.  Ovaries are normal. Other: No free fluid. Musculoskeletal: No aggressive osseous lesion. IMPRESSION: 1. Large obstructing calculus in the proximal LEFT ureter. Calculus clearly evident on the CT topogram. 2. Post bariatric surgery. Electronically Signed   By: Suzy Bouchard M.D.   On: 07/21/2017 17:11     STUDIES:    CULTURES: Bcx 9/3 > Ucx 9/3 >  ANTIBIOTICS: Ceftriaxone 9/2>  SIGNIFICANT EVENTS:   LINES/TUBES:   DISCUSSION: 66 year old with left ureteral stone, hydronephrosis,  pyelonepritis, urosepsis. Still has persistent lactic acid but blood pressures has responded to fluid bolus. Mental status is good and her renal function is improving.  Urosepsis Septic shock Continue ceftriaxone (has h/o sensitive e coli infection in past) Check Ucx and Bcx Check cortisol levels 1 more lt NS bolus. Continue infusion at 125cc/hr Pressors if she becomes hypotensive. Can use peripheral neo Repeat LA in 4-6 hrs  AKI Hypokalemia Replete K Monitor urine output and cr  PCCM will follow  The patient is critically ill with multiple organ system failure and requires high complexity decision making for assessment and support, frequent evaluation and titration of therapies, advanced monitoring, review of radiographic studies and interpretation of complex data.   Critical Care Time devoted to patient care services, exclusive of separately billable procedures, described in this note is 35 minutes.   Marshell Garfinkel MD [facility] Pager [phone] If no answer or after 3pm call: [phone] 07/22/2017, 3:03 PM

## 2017-07-23 ENCOUNTER — Encounter (HOSPITAL_COMMUNITY): Payer: Self-pay | Admitting: Urology

## 2017-07-23 LAB — BASIC METABOLIC PANEL
Anion gap: 6 (ref 5–15)
BUN: 6 mg/dL (ref 6–20)
CALCIUM: 7.6 mg/dL — AB (ref 8.9–10.3)
CHLORIDE: 109 mmol/L (ref 101–111)
CO2: 23 mmol/L (ref 22–32)
CREATININE: 0.6 mg/dL (ref 0.44–1.00)
Glucose, Bld: 108 mg/dL — ABNORMAL HIGH (ref 65–99)
Potassium: 3.8 mmol/L (ref 3.5–5.1)
SODIUM: 138 mmol/L (ref 135–145)

## 2017-07-23 LAB — CBC
HCT: 27 % — ABNORMAL LOW (ref 36.0–46.0)
Hemoglobin: 9.1 g/dL — ABNORMAL LOW (ref 12.0–15.0)
MCH: 29.4 pg (ref 26.0–34.0)
MCHC: 33.7 g/dL (ref 30.0–36.0)
MCV: 87.4 fL (ref 78.0–100.0)
PLATELETS: 128 10*3/uL — AB (ref 150–400)
RBC: 3.09 MIL/uL — AB (ref 3.87–5.11)
RDW: 14.1 % (ref 11.5–15.5)
WBC: 10.5 10*3/uL (ref 4.0–10.5)

## 2017-07-23 LAB — CORTISOL: CORTISOL PLASMA: 15 ug/dL

## 2017-07-23 MED ORDER — IBUPROFEN 200 MG PO TABS
600.0000 mg | ORAL_TABLET | Freq: Four times a day (QID) | ORAL | Status: DC | PRN
Start: 2017-07-23 — End: 2017-07-24
  Administered 2017-07-23: 600 mg via ORAL
  Filled 2017-07-23: qty 3

## 2017-07-23 MED ORDER — ACETAMINOPHEN 325 MG PO TABS
650.0000 mg | ORAL_TABLET | ORAL | Status: DC | PRN
  Administered 2017-07-23: 650 mg via ORAL
  Filled 2017-07-23 (×2): qty 2

## 2017-07-23 NOTE — Progress Notes (Signed)
Patient transferred from ICU to the 4th floor. Patient settled in the room and oriented to equipment. No telemetry ordered. Vital signs stable except for a temp of 101.2. Tylenol given per orders and will re-check temperature in about one hour. Will continue to monitor patient for changes.   Krista Armstrong [facility] 07/23/2017

## 2017-07-23 NOTE — Progress Notes (Signed)
2 Days Post-Op Subjective: Patient reports resolution of left flank pain. S/p L stent placement for left ureteral calculus and sepsis. Pt had increased work of breathing due to pulmonary edema. Good urine output Objective: Vital signs in last 24 hours: Temp:  [99.2 F (37.3 C)-101.3 F (38.5 C)] 101.3 F (38.5 C) (09/04 1741) Pulse Rate:  [107-110] 110 (09/04 1223) Resp:  [19-26] 24 (09/04 1223) BP: (88-140)/(45-85) 140/85 (09/04 1223) SpO2:  [87 %-92 %] 92 % (09/04 1228)  Intake/Output from previous day: 09/03 0701 - 09/04 0700 In: 6799.5 [I.V.:3699.5; IV Piggyback:3100] Out: 2225 [Urine:2225] Intake/Output this shift: No intake/output data recorded.  Physical Exam:  General:alert, cooperative and appears stated age GI: soft, non tender, normal bowel sounds, no palpable masses, no organomegaly, no inguinal hernia Female genitalia: not done Extremities: extremities normal, atraumatic, no cyanosis or edema  Lab Results:  Recent Labs  07/22/17 0102 07/22/17 0316 07/23/17 1523  HGB 9.9* 9.7* 9.1*  HCT 29.5* 29.4* 27.0*   BMET  Recent Labs  07/22/17 0316 07/23/17 1523  NA 139 138  K 3.0* 3.8  CL 110 109  CO2 21* 23  GLUCOSE 142* 108*  BUN 15 6  CREATININE 0.98 0.60  CALCIUM 7.4* 7.6*   No results for input(s): LABPT, INR in the last 72 hours. No results for input(s): LABURIN in the last 72 hours. Results for orders placed or performed during the hospital encounter of 07/21/17  MRSA PCR Screening     Status: Abnormal   Collection Time: 07/22/17 12:00 AM  Result Value Ref Range Status   MRSA by PCR POSITIVE (A) NEGATIVE Final    Comment:        The GeneXpert MRSA Assay (FDA approved for NASAL specimens only), is one component of a comprehensive MRSA colonization surveillance program. It is not intended to diagnose MRSA infection nor to guide or monitor treatment for MRSA infections. RESULT CALLED TO, READ BACK BY AND VERIFIED WITH: Rosaria Ferries  [id number] @ 0203 BY J SCOTTON     Studies/Results: Dg Chest 2 View  Result Date: 07/22/2017 CLINICAL DATA:  Shortness of breath EXAM: CHEST  2 VIEW COMPARISON:  None. FINDINGS: Postsurgical changes over the left breast. Tiny pleural effusions. Borderline to mild cardiomegaly. Diffuse interstitial opacity. Patchy focal opacity at the left base. No pneumothorax. Surgical clips in the upper abdomen IMPRESSION: 1. Borderline to mild cardiomegaly with central congestion 2. Tiny pleural effusions. Diffuse interstitial opacities suggestive of edema. Patchy atelectasis versus infiltrate at the left base Electronically Signed   By: Donavan Foil M.D.   On: 07/22/2017 17:10   Dg C-arm 1-60 Min-no Report  Result Date: 07/21/2017 Fluoroscopy was utilized by the requesting physician.  No radiographic interpretation.    Assessment/Plan: 66yo with left ureteral stone and sepsis from a urinary source  1.ICU management per pulmonary critical care. likelty transition to floor tomorrow. 2. Broad spectrum antibiotics pending urine culture   LOS: 2 days   Nicolette Bang 07/23/2017, 8:09 PM

## 2017-07-23 NOTE — Anesthesia Postprocedure Evaluation (Signed)
Anesthesia Post Note  Patient: Bradlee Quang  Procedure(s) Performed: Procedure(s) (LRB): CYSTOSCOPY WITH RETROGRADE PYELOGRAM, URETEROSCOPY AND STENT PLACEMENT (Left)     Patient location during evaluation: PACU Anesthesia Type: General Level of consciousness: awake and alert Pain management: pain level controlled Vital Signs Assessment: post-procedure vital signs reviewed and stable Respiratory status: spontaneous breathing, nonlabored ventilation, respiratory function stable and patient connected to nasal cannula oxygen Cardiovascular status: blood pressure returned to baseline and stable Postop Assessment: no signs of nausea or vomiting Anesthetic complications: no    Last Vitals:  Vitals:   07/23/17 1228 07/23/17 1341  BP:    Pulse:    Resp:    Temp:  37.6 C  SpO2: 92%     Last Pain:  Vitals:   07/23/17 1341  TempSrc: Oral  PainSc:                  Eliette Drumwright S

## 2017-07-23 NOTE — Progress Notes (Signed)
PULMONARY / CRITICAL CARE MEDICINE   Name: Krista Armstrong MRN: [id number] DOB: 10/15/1951    ADMISSION DATE:  07/21/2017 CONSULTATION DATE:  07/22/17  REFERRING MD:  Rosie Fate MD  CHIEF COMPLAINT:  Urosepsis, elevated lactic acid  BRIEF SUMMARY:   66 year old with history of breast cancer, chronic back pain, GERD, hypertension admitted with left ureteral stone, sepsis. She underwent cystoscopy and stent placement on 9/2. She had episode of hypotension on 9/3 requiring fluid boluses but has persistently elevated lactic acid and PCCM consulted for help with further management.   SUBJECTIVE:  BP stable.  Lactic acid cleared to 1.4 on 9/3.  Pt reports caffeine headache, otherwise no complaints.    VITAL SIGNS: BP 128/67   Pulse (!) 109   Temp 99.7 F (37.6 C) (Oral)   Resp (!) 25   Ht 5\' 6"  (1.676 m)   Wt 159 lb 9.8 oz (72.4 kg)   SpO2 92%   BMI 25.76 kg/m   HEMODYNAMICS:    VENTILATOR SETTINGS:    INTAKE / OUTPUT: I/O last 3 completed shifts: In: 8424.5 [I.V.:5324.5; IV Piggyback:3100] Out: 5462 [Urine:3425]  PHYSICAL EXAMINATION: General:  Adult female in NAD HEENT: MM pink/moist PSY: calm/appropriate  Neuro: AAOx4, speech clear, MAE  CV: s1s2 rrr, no m/r/g PULM: even/non-labored, lungs bilaterally clear, diminished bases posterior VO:JJKK, non-tender, bsx4 active  Extremities: warm/dry, no edema  Skin: no rashes or lesions  LABS:  BMET  Recent Labs Lab 07/21/17 1313 07/22/17 0102 07/22/17 0316  NA 139 140 139  K 3.7 3.3* 3.0*  CL 105 110 110  CO2 22 20* 21*  BUN 12 15 15   CREATININE 1.05* 1.05* 0.98  GLUCOSE 130* 114* 142*    Electrolytes  Recent Labs Lab 07/21/17 1313 07/22/17 0102 07/22/17 0316  CALCIUM 8.6* 7.5* 7.4*    CBC  Recent Labs Lab 07/21/17 1313 07/22/17 0102 07/22/17 0316  WBC 13.4* 4.8 10.0  HGB 11.3* 9.9* 9.7*  HCT 34.4* 29.5* 29.4*  PLT 224 136* 139*    Coag's No results for input(s): APTT, INR in the last  168 hours.  Sepsis Markers  Recent Labs Lab 07/22/17 0940 07/22/17 1351 07/22/17 1947  LATICACIDVEN 3.7* 3.7* 1.4    ABG No results for input(s): PHART, PCO2ART, PO2ART in the last 168 hours.  Liver Enzymes  Recent Labs Lab 07/21/17 1313  AST 32  ALT 19  ALKPHOS 28*  BILITOT 0.7  ALBUMIN 3.8    Cardiac Enzymes No results for input(s): TROPONINI, PROBNP in the last 168 hours.  Glucose No results for input(s): GLUCAP in the last 168 hours.  Imaging Dg Chest 2 View  Result Date: 07/22/2017 CLINICAL DATA:  Shortness of breath EXAM: CHEST  2 VIEW COMPARISON:  None. FINDINGS: Postsurgical changes over the left breast. Tiny pleural effusions. Borderline to mild cardiomegaly. Diffuse interstitial opacity. Patchy focal opacity at the left base. No pneumothorax. Surgical clips in the upper abdomen IMPRESSION: 1. Borderline to mild cardiomegaly with central congestion 2. Tiny pleural effusions. Diffuse interstitial opacities suggestive of edema. Patchy atelectasis versus infiltrate at the left base Electronically Signed   By: Donavan Foil M.D.   On: 07/22/2017 17:10     STUDIES:    CULTURES: Bcx 9/4 > Ucx 9/4 >  ANTIBIOTICS: Ceftriaxone 9/2 >  SIGNIFICANT EVENTS: 9/02  Admit with obstructive uropathy / urosepsis  9/03  PCCM consulted   LINES/TUBES:   DISCUSSION: 66 year old with left ureteral stone, hydronephrosis, pyelonepritis, urosepsis. Continued to have persistently elevated lactic acid  but blood pressures has responded to fluid bolus. Mental status is good and her renal function is improving.  Urosepsis Obstructive Uropathy  Septic Shock - resolved P: Continue ceftriaxone (hx of E-Coli in past, sensitive) Obtain urine and blood cultures 9/4 Reduce NS to 74ml/hr > likely can d/c IVF tomorrow 9/5 if consistently taking PO's  Urology following  AKI Hypokalemia P: Trend BMP / urinary output Replace electrolytes as indicated Avoid nephrotoxic agents,  ensure adequate renal perfusion  Await repeat labs from 9/4.  Pt can go to med-surg.  Mobilize.  PCCM will sign off.  Please call back if new needs arise.  Will ask [facility] to consult for medical care while on floor.    Noe Gens, NP-C [facility] Pgr: [phone] or if no answer [phone] 07/23/2017, 9:12 AM

## 2017-07-24 DIAGNOSIS — N10 Acute tubulo-interstitial nephritis: Secondary | ICD-10-CM

## 2017-07-24 DIAGNOSIS — N200 Calculus of kidney: Secondary | ICD-10-CM

## 2017-07-24 LAB — BASIC METABOLIC PANEL
Anion gap: 7 (ref 5–15)
BUN: 8 mg/dL (ref 6–20)
CHLORIDE: 110 mmol/L (ref 101–111)
CO2: 23 mmol/L (ref 22–32)
Calcium: 7.6 mg/dL — ABNORMAL LOW (ref 8.9–10.3)
Creatinine, Ser: 0.6 mg/dL (ref 0.44–1.00)
GFR calc non Af Amer: >60 mL/min (ref >60)
Glucose, Bld: 113 mg/dL — ABNORMAL HIGH (ref 65–99)
POTASSIUM: 3.5 mmol/L (ref 3.5–5.1)
SODIUM: 140 mmol/L (ref 135–145)

## 2017-07-24 LAB — CBC
HEMATOCRIT: 26.7 % — AB (ref 36.0–46.0)
HEMOGLOBIN: 9 g/dL — AB (ref 12.0–15.0)
MCH: 29.4 pg (ref 26.0–34.0)
MCHC: 33.7 g/dL (ref 30.0–36.0)
MCV: 87.3 fL (ref 78.0–100.0)
Platelets: 143 10*3/uL — ABNORMAL LOW (ref 150–400)
RBC: 3.06 MIL/uL — AB (ref 3.87–5.11)
RDW: 14.2 % (ref 11.5–15.5)
WBC: 8.4 10*3/uL (ref 4.0–10.5)

## 2017-07-24 LAB — URINE CULTURE: Culture: NO GROWTH

## 2017-07-24 MED ORDER — SULFAMETHOXAZOLE-TRIMETHOPRIM 800-160 MG PO TABS
1.0000 | ORAL_TABLET | Freq: Two times a day (BID) | ORAL | Status: DC
  Administered 2017-07-24 – 2017-07-25 (×2): 1 via ORAL
  Filled 2017-07-24 (×3): qty 1

## 2017-07-24 NOTE — Progress Notes (Signed)
PROGRESS NOTE                                                                                                                                                                                                             Patient Demographics:    Krista Armstrong, is a 66 y.o. female, DOB - 04-Nov-1951, MWU:[id number]  Admit date - 07/21/2017   Admitting Physician [name], MD  Outpatient Primary MD for the patient is Lucretia Kern, DO  LOS - 3    Chief Complaint  Patient presents with  . Abdominal Pain  . Emesis       Brief Narrative   66 year old female with history of chronic back pain, breast cancer, GERD, hypertension who was admitted with septic shock and obstructive uropathy secondary to left ureteral stone. She was managed in the ICU and underwent cystoscopy with left ureteral stent placement on 9/2. Patient transferred to medical floor and hospitalist consulting.   Subjective:   Patient reports that she is continuing to feel better. Denies any flank pain, headache, dizziness or lightheadedness.   Assessment  & Plan :   Principal problem Sepsis with septic shock Secondary to GI with obstructive left ureteral stone. Currently resolved. Underwent left ureteral stent placement. Empiric Rocephin,, urine culture negative for growth. ( I suspect culture was sent much after antibiotics was initiated). -Blood cultures negative. Discontinue IV fluids.   Based on prior urine cultures growing Escherichia coli which is pansensitive, d/w Dr Alyson Ingles, will transition her to oral Bactrim to complete 2 weeks course of antibiotic. (Stop date after 9/15)  Acute kidney injury Secondary to obstructive uropathy. Resolved with left ureteral stent placement.  Hypokalemia Replenished   Will sign off. Please call for any questions.   Code Status : full code  Family Communication  : none at bedside  Disposition Plan   : per primary      DVT Prophylaxis  :  Per primary  Lab Results  Component Value Date   PLT 143 (L) 07/24/2017    Antibiotics  :    Anti-infectives    Start     Dose/Rate Route Frequency Ordered Stop   07/22/17 2000  cefTRIAXone (ROCEPHIN) 1 g in dextrose 5 % 50 mL IVPB  Status:  Discontinued     1 g 100 mL/hr over 30 Minutes Intravenous  Every 24 hours 07/21/17 2157 07/21/17 2347   07/22/17 0800  cefTRIAXone (ROCEPHIN) 2 g in dextrose 5 % 50 mL IVPB     2 g 100 mL/hr over 30 Minutes Intravenous Every 24 hours 07/21/17 2347     07/21/17 2158  dextrose 5 % with cefTRIAXone (ROCEPHIN) ADS Med    Comments:  Octavio Graves   : cabinet override      07/21/17 2158 07/21/17 2228   07/21/17 1945  cefTRIAXone (ROCEPHIN) 1 g in dextrose 5 % 50 mL IVPB     1 g 100 mL/hr over 30 Minutes Intravenous  Once 07/21/17 1933 07/21/17 2047        Objective:   Vitals:   07/23/17 2058 07/24/17 0131 07/24/17 0415 07/24/17 0821  BP: (!) 120/57  113/73 126/76  Pulse: (!) 107  81 81  Resp: 16  16   Temp: 100.1 F (37.8 C) 98.1 F (36.7 C) 97.7 F (36.5 C) 98.1 F (36.7 C)  TempSrc: Oral Oral Oral Oral  SpO2: 91%  95% 92%  Weight:      Height:        Wt Readings from Last 3 Encounters:  07/21/17 72.4 kg (159 lb 9.8 oz)  05/09/17 72.9 kg (160 lb 12.8 oz)  02/12/17 68.5 kg (151 lb)     Intake/Output Summary (Last 24 hours) at 07/24/17 1324 Last data filed at 07/24/17 1000  Gross per 24 hour  Intake          1673.33 ml  Output             4827 ml  Net         -3153.67 ml     Physical Exam  Gen: not in distress HEENT:  moist mucosa, supple neck Chest: clear b/l, no added sounds CVS: N S1&S2, no murmurs, GI: soft, NT, ND, BS+ Musculoskeletal: warm, no edema     Data Review:    CBC  Recent Labs Lab 07/21/17 1313 07/22/17 0102 07/22/17 0316 07/23/17 1523 07/24/17 0355  WBC 13.4* 4.8 10.0 10.5 8.4  HGB 11.3* 9.9* 9.7* 9.1* 9.0*  HCT 34.4* 29.5* 29.4* 27.0*  26.7*  PLT 224 136* 139* 128* 143*  MCV 88.4 89.1 88.6 87.4 87.3  MCH 29.0 29.9 29.2 29.4 29.4  MCHC 32.8 33.6 33.0 33.7 33.7  RDW 13.4 13.9 13.8 14.1 14.2    Chemistries   Recent Labs Lab 07/21/17 1313 07/22/17 0102 07/22/17 0316 07/23/17 1523 07/24/17 0355  NA 139 140 139 138 140  K 3.7 3.3* 3.0* 3.8 3.5  CL 105 110 110 109 110  CO2 22 20* 21* 23 23  GLUCOSE 130* 114* 142* 108* 113*  BUN 12 15 15 6 8   CREATININE 8.25* 1.05* 0.98 0.60 0.60  CALCIUM 8.6* 7.5* 7.4* 7.6* 7.6*  AST 32  --   --   --   --   ALT 19  --   --   --   --   ALKPHOS 28*  --   --   --   --   BILITOT 0.7  --   --   --   --    ------------------------------------------------------------------------------------------------------------------ No results for input(s): CHOL, HDL, LDLCALC, TRIG, CHOLHDL, LDLDIRECT in the last 72 hours.  Lab Results  Component Value Date   HGBA1C 5.6 11/22/2016   ------------------------------------------------------------------------------------------------------------------ No results for input(s): TSH, T4TOTAL, T3FREE, THYROIDAB in the last 72 hours.  Invalid input(s): FREET3 ------------------------------------------------------------------------------------------------------------------ No results for input(s): VITAMINB12, FOLATE, FERRITIN, TIBC,  IRON, RETICCTPCT in the last 72 hours.  Coagulation profile No results for input(s): INR, PROTIME in the last 168 hours.  No results for input(s): DDIMER in the last 72 hours.  Cardiac Enzymes No results for input(s): CKMB, TROPONINI, MYOGLOBIN in the last 168 hours.  Invalid input(s): CK ------------------------------------------------------------------------------------------------------------------ No results found for: BNP  Inpatient Medications  Scheduled Meds: . Chlorhexidine Gluconate Cloth  6 each Topical Q0600  . gabapentin  600 mg Oral QHS  . mupirocin ointment  1 application Nasal BID  . pantoprazole   40 mg Oral Daily  . tamoxifen  20 mg Oral Daily   Continuous Infusions: . cefTRIAXone (ROCEPHIN)  IV Stopped (07/24/17 0833)   PRN Meds:.acetaminophen, [DISCONTINUED] diphenhydrAMINE **OR** diphenhydrAMINE, HYDROmorphone (DILAUDID) injection, hyoscyamine, ibuprofen, ondansetron, oxyCODONE-acetaminophen  Micro Results Recent Results (from the past 240 hour(s))  MRSA PCR Screening     Status: Abnormal   Collection Time: 07/22/17 12:00 AM  Result Value Ref Range Status   MRSA by PCR POSITIVE (A) NEGATIVE Final    Comment:        The GeneXpert MRSA Assay (FDA approved for NASAL specimens only), is one component of a comprehensive MRSA colonization surveillance program. It is not intended to diagnose MRSA infection nor to guide or monitor treatment for MRSA infections. RESULT CALLED TO, READ BACK BY AND VERIFIED WITH: Rosaria Ferries [id number] @ [place]   Culture, Urine     Status: None   Collection Time: 07/23/17  9:15 AM  Result Value Ref Range Status   Specimen Description URINE, CLEAN CATCH  Final   Special Requests NONE  Final   Culture   Final    NO GROWTH Performed at [facility], [address] [place], [place] [address]    Report Status 07/24/2017 FINAL  Final  Culture, blood (Routine X 2) w Reflex to ID Panel     Status: None (Preliminary result)   Collection Time: 07/23/17  3:10 PM  Result Value Ref Range Status   Specimen Description BLOOD RIGHT ARM  Final   Special Requests   Final    BOTTLES DRAWN AEROBIC ONLY Blood Culture adequate volume   Culture   Final    NO GROWTH < 24 HOURS Performed at [facility], [place] [address] [place], [place] [address]    Report Status PENDING  Incomplete  Culture, blood (Routine X 2) w Reflex to ID Panel     Status: None (Preliminary result)   Collection Time: 07/23/17  3:20 PM  Result Value Ref Range Status   Specimen Description BLOOD RIGHT ARM  Final   Special Requests   Final    BOTTLES DRAWN  AEROBIC ONLY Blood Culture adequate volume   Culture   Final    NO GROWTH < 24 HOURS Performed at [facility], [place] [address] [place], [place] [address]    Report Status PENDING  Incomplete    Radiology Reports Dg Chest 2 View  Result Date: 07/22/2017 CLINICAL DATA:  Shortness of breath EXAM: CHEST  2 VIEW COMPARISON:  None. FINDINGS: Postsurgical changes over the left breast. Tiny pleural effusions. Borderline to mild cardiomegaly. Diffuse interstitial opacity. Patchy focal opacity at the left base. No pneumothorax. Surgical clips in the upper abdomen IMPRESSION: 1. Borderline to mild cardiomegaly with central congestion 2. Tiny pleural effusions. Diffuse interstitial opacities suggestive of edema. Patchy atelectasis versus infiltrate at the left base Electronically Signed   By: Madie Reno.D.  On: 07/22/2017 17:10   Dg C-arm 1-60 Min-no Report  Result Date: 07/21/2017 Fluoroscopy was utilized by the requesting physician.  No radiographic interpretation.   Ct Renal Stone Study  Result Date: 07/21/2017 CLINICAL DATA:  LEFT-sided flank pain which began in the night EXAM: CT ABDOMEN AND PELVIS WITHOUT CONTRAST TECHNIQUE: Multidetector CT imaging of the abdomen and pelvis was performed following the standard protocol without IV contrast. COMPARISON:  None. FINDINGS: Lower chest: Lung bases are clear. Hepatobiliary: No focal hepatic lesion. Postcholecystectomy. No biliary dilatation. Pancreas: Pancreas is normal. No ductal dilatation. No pancreatic inflammation. Spleen: Normal spleen Adrenals/urinary tract: Adrenal glands normal. There is LEFT renal edema, perinephric stranding and mild hydronephrosis. There is an obstructing calculus in the proximal LEFT ureter measuring 9 mm in axial dimension (image 42, series 3). This calculus is seen on the CT topogram at the L3 vertebral body level measuring 13 mm. No bladder calculi. No RIGHT renal calculi. Stomach/Bowel: Post bariatric surgery.  Stomach, small-bowel and cecum normal. Colon and rectosigmoid colon are normal. Vascular/Lymphatic: Abdominal aorta is normal caliber. There is no retroperitoneal or periportal lymphadenopathy. No pelvic lymphadenopathy. Reproductive: Uterus is normal.  Ovaries are normal. Other: No free fluid. Musculoskeletal: No aggressive osseous lesion. IMPRESSION: 1. Large obstructing calculus in the proximal LEFT ureter. Calculus clearly evident on the CT topogram. 2. Post bariatric surgery. Electronically Signed   By: Suzy Bouchard M.D.   On: 07/21/2017 17:11    Time Spent in minutes  25   Louellen Molder M.D on 07/24/2017 at 1:25 PM  Between 7am to 7pm - Pager - [phone]  After 7pm go to [email or web address] - password [facility]  [facility] -  Office  [phone]

## 2017-07-24 NOTE — Progress Notes (Signed)
3 Days Post-Op Subjective: Patient reports resolution of left flank pain. S/p L stent placement for left ureteral calculus and sepsis. Fever 101.8 overnight. Good urine output Objective: Vital signs in last 24 hours: Temp:  [97.7 F (36.5 C)-99.2 F (37.3 C)] 99.2 F (37.3 C) (09/05 2033) Pulse Rate:  [81-103] 95 (09/05 2033) Resp:  [16] 16 (09/05 2033) BP: (113-128)/(73-86) 128/86 (09/05 2033) SpO2:  [92 %-95 %] 92 % (09/05 2033)  Intake/Output from previous day: 09/04 0701 - 09/05 0700 In: 1623.3 [I.V.:1573.3; IV Piggyback:50] Out: 6160 [Urine:4200; Stool:2] Intake/Output this shift: No intake/output data recorded.  Physical Exam:  General:alert, cooperative and appears stated age GI: soft, non tender, normal bowel sounds, no palpable masses, no organomegaly, no inguinal hernia Female genitalia: not done Extremities: extremities normal, atraumatic, no cyanosis or edema  Lab Results:  Recent Labs  07/22/17 0316 07/23/17 1523 07/24/17 0355  HGB 9.7* 9.1* 9.0*  HCT 29.4* 27.0* 26.7*   BMET  Recent Labs  07/23/17 1523 07/24/17 0355  NA 138 140  K 3.8 3.5  CL 109 110  CO2 23 23  GLUCOSE 108* 113*  BUN 6 8  CREATININE 0.60 0.60  CALCIUM 7.6* 7.6*   No results for input(s): LABPT, INR in the last 72 hours. No results for input(s): LABURIN in the last 72 hours. Results for orders placed or performed during the hospital encounter of 07/21/17  MRSA PCR Screening     Status: Abnormal   Collection Time: 07/22/17 12:00 AM  Result Value Ref Range Status   MRSA by PCR POSITIVE (A) NEGATIVE Final    Comment:        The GeneXpert MRSA Assay (FDA approved for NASAL specimens only), is one component of a comprehensive MRSA colonization surveillance program. It is not intended to diagnose MRSA infection nor to guide or monitor treatment for MRSA infections. RESULT CALLED TO, READ BACK BY AND VERIFIED WITH: Rosaria Ferries [id number] @ [place]   Culture,  Urine     Status: None   Collection Time: 07/23/17  9:15 AM  Result Value Ref Range Status   Specimen Description URINE, CLEAN CATCH  Final   Special Requests NONE  Final   Culture   Final    NO GROWTH Performed at [facility], [address], [place], [place] [address]    Report Status 07/24/2017 FINAL  Final  Culture, blood (Routine X 2) w Reflex to ID Panel     Status: None (Preliminary result)   Collection Time: 07/23/17  3:10 PM  Result Value Ref Range Status   Specimen Description BLOOD RIGHT ARM  Final   Special Requests   Final    BOTTLES DRAWN AEROBIC ONLY Blood Culture adequate volume   Culture   Final    NO GROWTH < 24 HOURS Performed at [facility], [place] [address], [place],  [address]    Report Status PENDING  Incomplete  Culture, blood (Routine X 2) w Reflex to ID Panel     Status: None (Preliminary result)   Collection Time: 07/23/17  3:20 PM  Result Value Ref Range Status   Specimen Description BLOOD RIGHT ARM  Final   Special Requests   Final    BOTTLES DRAWN AEROBIC ONLY Blood Culture adequate volume   Culture   Final    NO GROWTH < 24 HOURS Performed at [facility], [place] [address], [place],  [address]    Report Status PENDING  Incomplete    Studies/Results: No results found.  Assessment/Plan: 66yo with left ureteral stone and sepsis from a urinary source  1. D/c foley 2. Transition to bactrim. If pt remains afebrile will d/c tomorrow   LOS: 3 days   Nicolette Bang 07/24/2017, 9:43 PM

## 2017-07-25 MED ORDER — TAMSULOSIN HCL 0.4 MG PO CAPS: 0.4000 mg | ORAL_CAPSULE | Freq: Every day | ORAL | 0 refills | Status: DC

## 2017-07-25 MED ORDER — FLUCONAZOLE 150 MG PO TABS: 150.0000 mg | ORAL_TABLET | Freq: Every day | ORAL | 0 refills | Status: DC

## 2017-07-25 MED ORDER — SULFAMETHOXAZOLE-TRIMETHOPRIM 800-160 MG PO TABS: 1.0000 | ORAL_TABLET | Freq: Two times a day (BID) | ORAL | 0 refills | Status: DC

## 2017-07-25 MED ORDER — OXYCODONE-ACETAMINOPHEN 5-325 MG PO TABS: 1.0000 | ORAL_TABLET | ORAL | 0 refills | Status: DC | PRN

## 2017-07-25 NOTE — Discharge Instructions (Signed)

## 2017-07-27 NOTE — Discharge Summary (Signed)
Physician Discharge Summary  Patient ID: Krista Armstrong MRN: [id number] DOB/AGE: 02/06/1951 66 y.o.  Admit date: 07/21/2017 Discharge date: 07/27/2017  Admission Diagnoses: Sepsis from a ureteral calculus Discharge Diagnoses:  Active Problems:   Sepsis [facility])   Discharged Condition: good  Hospital Course: The patient tolerated the procedure well and was transferred to the floor on IV pain meds, IV fluid. On POD#1 foley was removed, pt was started on clear liquid diet and they ambulated in the halls. On POD#2 the patient was transitioned to a regular diet, IVFs were discontinued, and the patient passed flatus. Prior to discharge the pt was tolerating a regular diet, pain was controlled on PO pain meds, they were ambulating without difficulty, and they had normal bowel function.   Consults: None  Significant Diagnostic Studies: none  Treatments: surgery: ureteral stent placement  Discharge Exam: Blood pressure 128/86, pulse 95, temperature 99.2 F (37.3 C), temperature source Oral, resp. rate 16, height 5\' 6"  (1.676 m), weight 72.4 kg (159 lb 9.8 oz), SpO2 92 %. General appearance: alert, cooperative and appears stated age Eyes: conjunctivae/corneas clear. PERRL, EOM's intact. Fundi benign. Nose: Nares normal. Septum midline. Mucosa normal. No drainage or sinus tenderness. Resp: clear to auscultation bilaterally Cardio: regular rate and rhythm, S1, S2 normal, no murmur, click, rub or gallop GI: soft, non-tender; bowel sounds normal; no masses,  no organomegaly Extremities: extremities normal, atraumatic, no cyanosis or edema Neurologic: Grossly normal  Disposition: 01-Home or Self Care  Discharge Instructions    Discharge patient    Complete by:  As directed    Discharge disposition:  01-Home or Self Care   Discharge patient date:  07/25/2017     Allergies as of 07/25/2017      Reactions   Vicodin [hydrocodone-acetaminophen] Other (See Comments)   Massive headache       Medication List    TAKE these medications   amLODipine 5 MG tablet Commonly known as:  NORVASC TAKE 1 TABLET (5 MG TOTAL) BY MOUTH DAILY.   Biotin 1 MG Caps Take 1 mg by mouth daily.   esomeprazole 40 MG capsule Commonly known as:  NEXIUM Take 1 capsule (40 mg total) by mouth daily.   fluconazole 150 MG tablet Commonly known as:  DIFLUCAN Take 1 tablet (150 mg total) by mouth daily.   gabapentin 300 MG capsule Commonly known as:  NEURONTIN Take 2 capsules (600 mg total) by mouth at bedtime.   losartan 50 MG tablet Commonly known as:  COZAAR TAKE 1 TABLET (50 MG TOTAL) BY MOUTH DAILY. *MAX DAY SUPPLY PER INSURANCE*   oxyCODONE-acetaminophen 5-325 MG tablet Commonly known as:  PERCOCET/ROXICET Take 1-2 tablets by mouth every 4 (four) hours as needed for moderate pain.   sulfamethoxazole-trimethoprim 800-160 MG tablet Commonly known as:  BACTRIM DS,SEPTRA DS Take 1 tablet by mouth every 12 (twelve) hours.   tamoxifen 20 MG tablet Commonly known as:  NOLVADEX Take 1 tablet (20 mg total) by mouth daily.   tamsulosin 0.4 MG Caps capsule Commonly known as:  FLOMAX Take 1 capsule (0.4 mg total) by mouth daily after supper.            Discharge Care Instructions        Start     Ordered   07/25/17 0000  oxyCODONE-acetaminophen (PERCOCET/ROXICET) 5-325 MG tablet  Every 4 hours PRN     07/25/17 0954   07/25/17 0000  sulfamethoxazole-trimethoprim (BACTRIM DS,SEPTRA DS) 800-160 MG tablet  Every 12 hours  07/25/17 0954   07/25/17 0000  tamsulosin (FLOMAX) 0.4 MG CAPS capsule  Daily after supper     07/25/17 0954   07/25/17 0000  Discharge patient    Question Answer Comment  Discharge disposition 01-Home or Self Care   Discharge patient date 07/25/2017      07/25/17 0954   07/25/17 0000  fluconazole (DIFLUCAN) 150 MG tablet  Daily     07/25/17 1157     Follow-up Information    Sophea Rackham, Candee Furbish, MD. Call in 2 week(s).   Specialty:  Urology Contact  information: [address] [place] [address] [phone]           Signed: Nicolette Bang 07/27/2017, 4:24 PM

## 2017-07-28 LAB — CULTURE, BLOOD (ROUTINE X 2)
CULTURE: NO GROWTH
CULTURE: NO GROWTH
SPECIAL REQUESTS: ADEQUATE
Special Requests: ADEQUATE

## 2017-07-29 ENCOUNTER — Telehealth: Payer: Self-pay

## 2017-07-29 NOTE — Telephone Encounter (Signed)
Admit date: 07/21/2017 Discharge date: 07/27/2017 Admission Diagnoses: Sepsis from a ureteral calculus Discharge Diagnoses:  Active Problems:   Sepsis [facility]) Discharged Condition: good  Spoke with pt and she states that she is doing well. She has some mild left-sided abdominal pain that she attributes to stent placement. She is taking pain medication as needed for this. She had not been taking Bactrim as directed, she thought it was a once a day medication. Reviewed all medications with pt and reiterated to take Bactrim BID. She voiced understanding and will start that increase today.  Appt not scheduled with Dr. Maudie Mercury as pt would like to wait until she scheduled with Urology so that she can make all her appts in one day.   Transition Care Management Follow-up Telephone Call   Date discharged? 07/27/17   How have you been since you were released from the hospital? Good, returning to work tomorrow   Do you understand why you were in the hospital? yes   Do you understand the discharge instructions? yes   Where were you discharged to? home   Items Reviewed:  Medications reviewed: yes  Allergies reviewed: yes  Dietary changes reviewed: yes  Referrals reviewed: yes   Functional Questionnaire:   Activities of Daily Living (ADLs):   She states they are independent in the following: ambulation, bathing and hygiene, feeding, continence, grooming, toileting and dressing States they require assistance with the following: none   Any transportation issues/concerns?: no   Any patient concerns? no   Confirmed importance and date/time of follow-up visits scheduled yes  Provider Appointment booked with  Confirmed with patient if condition begins to worsen call PCP or go to the ER.  Patient was given the office number and encouraged to call back with question or concerns.  : yes

## 2017-07-30 ENCOUNTER — Other Ambulatory Visit: Payer: Self-pay | Admitting: Urology

## 2017-08-01 ENCOUNTER — Ambulatory Visit (INDEPENDENT_AMBULATORY_CARE_PROVIDER_SITE_OTHER)
Admission: RE | Admit: 2017-08-01 | Discharge: 2017-08-01 | Disposition: A | Discharge status: Home / Self Care | Payer: 59 | Source: Ambulatory Visit | Attending: Family Medicine | Admitting: Family Medicine

## 2017-08-01 ENCOUNTER — Telehealth: Payer: Self-pay | Admitting: *Deleted

## 2017-08-01 ENCOUNTER — Encounter: Payer: Self-pay | Admitting: Family Medicine

## 2017-08-01 ENCOUNTER — Ambulatory Visit (INDEPENDENT_AMBULATORY_CARE_PROVIDER_SITE_OTHER): Payer: 59 | Admitting: Family Medicine

## 2017-08-01 VITALS — BP 102/78 | HR 91 | Temp 98.4°F | Ht 66.0 in | Wt 160.6 lb

## 2017-08-01 DIAGNOSIS — R059 Cough, unspecified: Secondary | ICD-10-CM

## 2017-08-01 DIAGNOSIS — N179 Acute kidney failure, unspecified: Secondary | ICD-10-CM | POA: Diagnosis not present

## 2017-08-01 DIAGNOSIS — I959 Hypotension, unspecified: Secondary | ICD-10-CM | POA: Diagnosis not present

## 2017-08-01 DIAGNOSIS — E876 Hypokalemia: Secondary | ICD-10-CM

## 2017-08-01 DIAGNOSIS — A419 Sepsis, unspecified organism: Secondary | ICD-10-CM

## 2017-08-01 DIAGNOSIS — N200 Calculus of kidney: Secondary | ICD-10-CM

## 2017-08-01 DIAGNOSIS — R05 Cough: Secondary | ICD-10-CM

## 2017-08-01 DIAGNOSIS — R0600 Dyspnea, unspecified: Secondary | ICD-10-CM | POA: Diagnosis not present

## 2017-08-01 DIAGNOSIS — R300 Dysuria: Secondary | ICD-10-CM | POA: Diagnosis not present

## 2017-08-01 LAB — POC URINALSYSI DIPSTICK (AUTOMATED)
Bilirubin, UA: NEGATIVE
Glucose, UA: NEGATIVE
Ketones, UA: NEGATIVE
NITRITE UA: NEGATIVE
PH UA: 6 (ref 5.0–8.0)
Spec Grav, UA: 1.015 (ref 1.010–1.025)
UROBILINOGEN UA: 0.2 U/dL

## 2017-08-01 MED ORDER — BENZONATATE 100 MG PO CAPS: 100.0000 mg | ORAL_CAPSULE | Freq: Three times a day (TID) | ORAL | 0 refills | Status: DC | PRN

## 2017-08-01 NOTE — Telephone Encounter (Signed)
Per Dr Maudie Mercury, I attempted to call the pt to let her know the chest x-ray was normal and the urine will be sent for further testing and I was unable to leave a message due to no voicemail being set up.

## 2017-08-01 NOTE — Telephone Encounter (Signed)
Pt did not schedule TCM as she wanted to wait and schedule multiple appts in same day. I was attempting to call her this morning to see if she was ready to schedule and found that she is now scheduled for Same Day today at 3:30pm.   Dr. Maudie Mercury - FYI. Thanks!

## 2017-08-01 NOTE — Patient Instructions (Signed)
BEFORE YOU LEAVE: -xray sheet -labs -follow up: 1 month  Get the xray today.  Tessalon for cough if needed.  Follow up with urology with any urinary concerns and we will plan to send urine results to your urology office.  Ensure plenty of fluids and regular small meals.  I hope you are feeling better soon! Seek care immediately if worsening, new concerns or you are not improving with treatment.

## 2017-08-01 NOTE — Progress Notes (Signed)
HPI:  Here for a transition of care visit. See phone note. Hospitalized for .  Krista Armstrong is a pleasant 66 y.o. with a PMH significant for  Hypertension, acid reflux, recurrent UTIs andbreast cancer here for a hospital follow up. See transitional care phone note in Epic. Per review of discharge documents and patient: Hospitalized  07/21/2017 until 07/27/2017 Primary admitting complaint(s):left flank pain, nausea, vomiting, urgency, frequency, dysuria, fever 102.9, elevated white blood cell count, tachycardia and hypertension in the emergency room Primary admitting diagnosis (es) and treatment:sepsis and ureteral calculus - treated with IV antibiotics, fluid resuscitation and left ureteral stent placement - Escherichia coli in urine was pansensitive and she was transitioned to Bactrim for a 2 week course, on flomax Other significant diagnosis (es) and treatment:persistently elevated lactic acid, hypokalemia, hypotension, AKI - PCCM consulted Follow up concerns per discharge document:follow-up with urology, reports she has surgery planned with urology next week CBC and BMP improving on discharge - but with anemia, last lactic acid normal Reports today: hasn't felt great since discharge, mild SOB, fatigue, cough the last 2 days, PND, nasal congestion, a little lightheaded on and off, mild flank pain, poor appetite Denies:fevers, vomiting, diarrhea, hematuria, pyuria, hemoptysis, chest pain, leg swelling, palpitations, wheezing, skin rash   ROS: See pertinent positives and negatives per HPI.  Past Medical History:  Diagnosis Date  . Abnormal uterine bleeding   . 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 Chronic back pain   . Colon polyps   . Family history of breast cancer   . GERD (gastroesophageal reflux disease)   . Hot flashes   . Hypertension   . Radiation 07/07/15-08/09/15   Left breast  . Ruptured lumbar disc   . Transfusion history   . Wears glasses      Past Surgical History:  Procedure Laterality Date  . BACK SURGERY  2000, 2009  . BACK SURGERY    . BREAST BIOPSY Left 05/03/2015   U/S Core- Benign  . BREAST BIOPSY Right    U/S Core- Benign  . BREAST LUMPECTOMY Left 05/30/2015  . BREAST SURGERY    . colon polyps    . CYSTOSCOPY WITH RETROGRADE PYELOGRAM, URETEROSCOPY AND STENT PLACEMENT Left 07/21/2017   Procedure: CYSTOSCOPY WITH RETROGRADE PYELOGRAM, URETEROSCOPY AND STENT PLACEMENT;  Surgeon: [name], MD;  Location: [facility];  Service: Urology;  Laterality: Left;  . DILATATION & CURETTAGE/HYSTEROSCOPY WITH MYOSURE N/A 02/05/2017   Procedure: DILATATION & CURETTAGE/HYSTEROSCOPY WITH MYOSURE;  Surgeon: [name], MD;  Location: [facility];  Service: Gynecology;  Laterality: N/A;  . RADIOACTIVE SEED GUIDED MASTECTOMY WITH AXILLARY SENTINEL LYMPH NODE BIOPSY Left 05/30/2015   Procedure: LEFT PARTIAL MASTECTOMY WITH RADIOACTIVE SEED LOCALIZATION, LEFT AXILLARY SENTINEL LYMPH NODE BIOPSY;  Surgeon: Fanny Skates, MD;  Location: [facility];  Service: General;  Laterality: Left;  . ruptured lumbar disc    . STOMACH SURGERY  2006   tumors excised    Family History  Problem Relation Age of Onset  . Breast cancer Mother 48  . Stroke Father   . Heart disease Maternal Grandmother   . Cervical cancer Maternal Aunt   . Colon cancer Neg Hx   . Colon polyps Neg Hx   . Diabetes Neg Hx   . Kidney disease Neg Hx   . Esophageal cancer Neg Hx   . Gallbladder disease Neg Hx     Social History   Social History  .  Marital status: Married    Spouse name: N/A  . Number of children: 3  . Years of education: N/A   Occupational History  . [employer] History Main Topics  . Smoking status: Never Smoker  . Smokeless tobacco: Never Used  . Alcohol use 4.2 oz/week    7 Glasses of wine per week     Comment: wine every  night   . Drug use: No  . Sexual activity: Yes     Partners: Male    Birth control/ protection: Post-menopausal   Other Topics Concern  . None   Social History Narrative  . None     Current Outpatient Prescriptions:  .  amLODipine (NORVASC) 5 MG tablet, TAKE 1 TABLET (5 MG TOTAL) BY MOUTH DAILY., Disp: 90 tablet, Rfl: 3 .  Biotin 1 MG CAPS, Take 1 mg by mouth daily. , Disp: , Rfl:  .  esomeprazole (NEXIUM) 40 MG capsule, Take 1 capsule (40 mg total) by mouth daily., Disp: 90 capsule, Rfl: 1 .  gabapentin (NEURONTIN) 300 MG capsule, Take 2 capsules (600 mg total) by mouth at bedtime., Disp: 180 capsule, Rfl: 3 .  losartan (COZAAR) 50 MG tablet, TAKE 1 TABLET (50 MG TOTAL) BY MOUTH DAILY. *MAX DAY SUPPLY PER INSURANCE*, Disp: 90 tablet, Rfl: 3 .  oxyCODONE-acetaminophen (PERCOCET/ROXICET) 5-325 MG tablet, Take 1-2 tablets by mouth every 4 (four) hours as needed for moderate pain., Disp: 30 tablet, Rfl: 0 .  sulfamethoxazole-trimethoprim (BACTRIM DS,SEPTRA DS) 800-160 MG tablet, Take 1 tablet by mouth every 12 (twelve) hours., Disp: 20 tablet, Rfl: 0 .  tamoxifen (NOLVADEX) 20 MG tablet, Take 1 tablet (20 mg total) by mouth daily., Disp: 90 tablet, Rfl: 3 .  tamsulosin (FLOMAX) 0.4 MG CAPS capsule, Take 1 capsule (0.4 mg total) by mouth daily after supper., Disp: 30 capsule, Rfl: 0 .  benzonatate (TESSALON PERLES) 100 MG capsule, Take 1 capsule (100 mg total) by mouth 3 (three) times daily as needed., Disp: 20 capsule, Rfl: 0  EXAM:  Vitals:   08/01/17 1534  BP: 102/78  Pulse: 91  Temp: 98.4 F (36.9 C)  SpO2: 98%    Body mass index is 25.92 kg/m.  GENERAL: vitals reviewed and listed above, alert, oriented, appears well hydrated and in no acute distress  HEENT: atraumatic, conjunttiva clear, no obvious abnormalities on inspection of external nose and ears, normal appearance of ear canals and TMs, clear nasal congestion, mild post oropharyngeal erythema with PND, no tonsillar edema or exudate, no sinus TTP  NECK: no obvious  masses on inspection  LUNGS: clear to auscultation bilaterally, no wheezes, rales or rhonchi, good air movement  CV: HRRR, no peripheral edema  MS: moves all extremities without noticeable abnormality  PSYCH: pleasant and cooperative, no obvious depression or anxiety  ASSESSMENT AND PLAN:  Discussed the following assessment and plan:  Dysuria - Plan: POCT Urinalysis Dipstick (Automated), Urine Microscopic Only, Culture, Urine  Sepsis, due to unspecified organism ([place]) - Plan: CBC with Differential/Platelet  Nephrolithiasis  Hypokalemia - Plan: Basic metabolic panel  Hypotension, unspecified hypotension type  AKI (acute kidney injury) ([place])  Cough - Plan: DG Chest 2 View  Dyspnea, unspecified type - Plan: D-dimer, Quantitative  -lung exam and O2 good here, multiple complaints following recent illness -will get labs, CXR, urine per orders -tessalon for cough -Patient advised to notify urologist immediately of any urological concerns, also will plan to send urine studies, labs to urology office as well, advised  prompt UCC or ER evaluation over the long weekend (clinic week shortened due to weather - clinic closed tomorrow afternoon) if symptoms worsen or persist or new concerns arise.  Patient Instructions  BEFORE YOU LEAVE: -xray sheet -labs -follow up: 1 month  Get the xray today.  Tessalon for cough if needed.  Follow up with urology with any urinary concerns and we will plan to send urine results to your urology office.  Ensure plenty of fluids and regular small meals.  I hope you are feeling better soon! Seek care immediately if worsening, new concerns or you are not improving with treatment.      Colin Benton R., DO

## 2017-08-02 ENCOUNTER — Ambulatory Visit (INDEPENDENT_AMBULATORY_CARE_PROVIDER_SITE_OTHER)
Admission: RE | Admit: 2017-08-02 | Discharge: 2017-08-02 | Disposition: A | Discharge status: Home / Self Care | Payer: 59 | Source: Ambulatory Visit | Attending: Family Medicine | Admitting: Family Medicine

## 2017-08-02 ENCOUNTER — Other Ambulatory Visit: Payer: Self-pay | Admitting: Family Medicine

## 2017-08-02 DIAGNOSIS — R0602 Shortness of breath: Secondary | ICD-10-CM

## 2017-08-02 LAB — URINE CULTURE
MICRO NUMBER: 81012282
SPECIMEN QUALITY:: ADEQUATE

## 2017-08-02 LAB — CBC WITH DIFFERENTIAL/PLATELET
BASOS ABS: 0 10*3/uL (ref 0.0–0.1)
BASOS PCT: 0.7 % (ref 0.0–3.0)
EOS ABS: 0.1 10*3/uL (ref 0.0–0.7)
Eosinophils Relative: 2.2 % (ref 0.0–5.0)
HEMATOCRIT: 32.5 % — AB (ref 36.0–46.0)
HEMOGLOBIN: 10.7 g/dL — AB (ref 12.0–15.0)
LYMPHS PCT: 30.2 % (ref 12.0–46.0)
Lymphs Abs: 1.7 10*3/uL (ref 0.7–4.0)
MCHC: 33 g/dL (ref 30.0–36.0)
MCV: 89.6 fl (ref 78.0–100.0)
Monocytes Absolute: 0.5 10*3/uL (ref 0.1–1.0)
Monocytes Relative: 9.3 % (ref 3.0–12.0)
Neutro Abs: 3.2 10*3/uL (ref 1.4–7.7)
Neutrophils Relative %: 57.6 % (ref 43.0–77.0)
RBC: 3.63 Mil/uL — ABNORMAL LOW (ref 3.87–5.11)
RDW: 14.2 % (ref 11.5–15.5)
WBC: 5.5 10*3/uL (ref 4.0–10.5)

## 2017-08-02 LAB — D-DIMER, QUANTITATIVE (NOT AT ARMC): D DIMER QUANT: 4.28 ug{FEU}/mL — AB (ref <0.50)

## 2017-08-02 LAB — BASIC METABOLIC PANEL
BUN: 10 mg/dL (ref 6–23)
CHLORIDE: 101 meq/L (ref 96–112)
CO2: 27 mEq/L (ref 19–32)
CREATININE: 0.97 mg/dL (ref 0.40–1.20)
Calcium: 9.4 mg/dL (ref 8.4–10.5)
GFR: 74.01 mL/min (ref >60.00)
Glucose, Bld: 98 mg/dL (ref 70–99)
Potassium: 4.5 mEq/L (ref 3.5–5.1)
Sodium: 137 mEq/L (ref 135–145)

## 2017-08-02 LAB — URINALYSIS, MICROSCOPIC ONLY

## 2017-08-02 MED ORDER — IOPAMIDOL (ISOVUE-370) INJECTION 76%
80.0000 mL | Freq: Once | INTRAVENOUS | Status: AC | PRN
  Administered 2017-08-02: 80 mL via INTRAVENOUS

## 2017-08-05 NOTE — Telephone Encounter (Signed)
See results note. 

## 2017-08-14 ENCOUNTER — Encounter (HOSPITAL_BASED_OUTPATIENT_CLINIC_OR_DEPARTMENT_OTHER): Payer: Self-pay | Admitting: *Deleted

## 2017-08-14 NOTE — Progress Notes (Signed)
NPO AFTER MN.  ARRIVE AT 0600.  CURRENT LAB RESULTS AND EKG IN CHART AND EPIC.  WILL TAKE AM MEDS W/ SIPS OF WATER.

## 2017-08-15 NOTE — Anesthesia Preprocedure Evaluation (Addendum)
Anesthesia Evaluation  Patient identified by MRN, date of birth, ID band Patient awake    Reviewed: Allergy & Precautions, NPO status , Patient's Chart, lab work & pertinent test results  Airway Mallampati: II  TM Distance: >3 FB Neck ROM: Full    Dental no notable dental hx. (+) Dental Advisory Given   Pulmonary neg pulmonary ROS,    Pulmonary exam normal breath sounds clear to auscultation       Cardiovascular hypertension, Pt. on medications Normal cardiovascular exam Rhythm:Regular Rate:Normal     Neuro/Psych negative neurological ROS  negative psych ROS   GI/Hepatic Neg liver ROS, GERD  Medicated,  Endo/Other  negative endocrine ROS  Renal/GU negative Renal ROS  negative genitourinary   Musculoskeletal negative musculoskeletal ROS (+)   Abdominal   Peds negative pediatric ROS (+)  Hematology negative hematology ROS (+)   Anesthesia Other Findings   Reproductive/Obstetrics negative OB ROS                             Anesthesia Physical  Anesthesia Plan  ASA: II  Anesthesia Plan: General   Post-op Pain Management:    Induction: Intravenous  PONV Risk Score and Plan: 3 and Ondansetron, Dexamethasone, Midazolam and Treatment may vary due to age or medical condition  Airway Management Planned: LMA  Additional Equipment:   Intra-op Plan:   Post-operative Plan: Extubation in OR  Informed Consent: I have reviewed the patients History and Physical, chart, labs and discussed the procedure including the risks, benefits and alternatives for the proposed anesthesia with the patient or authorized representative who has indicated his/her understanding and acceptance.   Dental advisory given  Plan Discussed with: CRNA and Surgeon  Anesthesia Plan Comments:        Anesthesia Quick Evaluation

## 2017-08-16 ENCOUNTER — Ambulatory Visit (HOSPITAL_BASED_OUTPATIENT_CLINIC_OR_DEPARTMENT_OTHER)
Admission: RE | Admit: 2017-08-16 | Discharge: 2017-08-16 | Disposition: A | Discharge status: Home / Self Care | Payer: 59 | Source: Ambulatory Visit | Attending: Urology | Admitting: Urology

## 2017-08-16 ENCOUNTER — Ambulatory Visit (HOSPITAL_BASED_OUTPATIENT_CLINIC_OR_DEPARTMENT_OTHER): Payer: 59 | Admitting: Anesthesiology

## 2017-08-16 ENCOUNTER — Encounter (HOSPITAL_BASED_OUTPATIENT_CLINIC_OR_DEPARTMENT_OTHER): Payer: Self-pay | Admitting: *Deleted

## 2017-08-16 ENCOUNTER — Encounter (HOSPITAL_BASED_OUTPATIENT_CLINIC_OR_DEPARTMENT_OTHER)
Admission: RE | Disposition: A | Discharge status: Home / Self Care | Payer: Self-pay | Source: Ambulatory Visit | Attending: Urology

## 2017-08-16 DIAGNOSIS — I10 Essential (primary) hypertension: Secondary | ICD-10-CM | POA: Diagnosis not present

## 2017-08-16 DIAGNOSIS — N201 Calculus of ureter: Secondary | ICD-10-CM | POA: Insufficient documentation

## 2017-08-16 DIAGNOSIS — K219 Gastro-esophageal reflux disease without esophagitis: Secondary | ICD-10-CM | POA: Diagnosis not present

## 2017-08-16 DIAGNOSIS — C50512 Malignant neoplasm of lower-outer quadrant of left female breast: Secondary | ICD-10-CM | POA: Diagnosis not present

## 2017-08-16 DIAGNOSIS — A419 Sepsis, unspecified organism: Secondary | ICD-10-CM | POA: Diagnosis not present

## 2017-08-16 DIAGNOSIS — R109 Unspecified abdominal pain: Secondary | ICD-10-CM | POA: Diagnosis present

## 2017-08-16 HISTORY — DX: Personal history of adenomatous and serrated colon polyps: Z86.0101

## 2017-08-16 HISTORY — DX: Personal history of colonic polyps: Z86.010

## 2017-08-16 HISTORY — DX: Diaphragmatic hernia without obstruction or gangrene: K44.9

## 2017-08-16 HISTORY — PX: HOLMIUM LASER APPLICATION: SHX5852

## 2017-08-16 HISTORY — DX: Personal history of irradiation: Z92.3

## 2017-08-16 HISTORY — DX: Calculus of ureter: N20.1

## 2017-08-16 HISTORY — DX: Personal history of other infectious and parasitic diseases: Z86.19

## 2017-08-16 HISTORY — PX: CYSTOSCOPY WITH RETROGRADE PYELOGRAM, URETEROSCOPY AND STENT PLACEMENT: SHX5789

## 2017-08-16 HISTORY — DX: Acquired absence of stomach (part of): Z90.3

## 2017-08-16 SURGERY — CYSTOURETEROSCOPY, WITH RETROGRADE PYELOGRAM AND STENT INSERTION
Anesthesia: General | Site: Renal | Laterality: Left

## 2017-08-16 MED ORDER — DEXTROSE 5 % IV SOLN
INTRAVENOUS | Status: AC
  Filled 2017-08-16: qty 50

## 2017-08-16 MED ORDER — FENTANYL CITRATE (PF) 100 MCG/2ML IJ SOLN
INTRAMUSCULAR | Status: AC
  Filled 2017-08-16: qty 2

## 2017-08-16 MED ORDER — LACTATED RINGERS IV SOLN
INTRAVENOUS | Status: DC | PRN
  Administered 2017-08-16: 09:00:00 via INTRAVENOUS

## 2017-08-16 MED ORDER — FENTANYL CITRATE (PF) 100 MCG/2ML IJ SOLN
INTRAMUSCULAR | Status: DC | PRN
  Administered 2017-08-16 (×2): 50 ug via INTRAVENOUS

## 2017-08-16 MED ORDER — DEXAMETHASONE SODIUM PHOSPHATE 4 MG/ML IJ SOLN
INTRAMUSCULAR | Status: DC | PRN
  Administered 2017-08-16: 10 mg via INTRAVENOUS

## 2017-08-16 MED ORDER — OXYCODONE-ACETAMINOPHEN 5-325 MG PO TABS: 1.0000 | ORAL_TABLET | ORAL | 0 refills | Status: DC | PRN

## 2017-08-16 MED ORDER — IOHEXOL 300 MG/ML  SOLN
INTRAMUSCULAR | Status: DC | PRN
  Administered 2017-08-16: 6 mL

## 2017-08-16 MED ORDER — PROPOFOL 10 MG/ML IV BOLUS
INTRAVENOUS | Status: AC
  Filled 2017-08-16: qty 40

## 2017-08-16 MED ORDER — ARTIFICIAL TEARS OPHTHALMIC OINT
TOPICAL_OINTMENT | OPHTHALMIC | Status: AC
  Filled 2017-08-16: qty 3.5

## 2017-08-16 MED ORDER — DEXAMETHASONE SODIUM PHOSPHATE 10 MG/ML IJ SOLN
INTRAMUSCULAR | Status: AC
  Filled 2017-08-16: qty 1

## 2017-08-16 MED ORDER — SULFAMETHOXAZOLE-TRIMETHOPRIM 800-160 MG PO TABS: 1.0000 | ORAL_TABLET | Freq: Two times a day (BID) | ORAL | 0 refills | Status: DC

## 2017-08-16 MED ORDER — EPHEDRINE SULFATE-NACL 50-0.9 MG/10ML-% IV SOSY
PREFILLED_SYRINGE | INTRAVENOUS | Status: DC | PRN
  Administered 2017-08-16: 10 mg via INTRAVENOUS
  Administered 2017-08-16: 15 mg via INTRAVENOUS
  Administered 2017-08-16: 10 mg via INTRAVENOUS

## 2017-08-16 MED ORDER — MIDAZOLAM HCL 2 MG/2ML IJ SOLN
INTRAMUSCULAR | Status: AC
  Filled 2017-08-16: qty 2

## 2017-08-16 MED ORDER — PROPOFOL 10 MG/ML IV BOLUS
INTRAVENOUS | Status: DC | PRN
  Administered 2017-08-16: 200 mg via INTRAVENOUS
  Administered 2017-08-16: 50 mg via INTRAVENOUS

## 2017-08-16 MED ORDER — KETOROLAC TROMETHAMINE 30 MG/ML IJ SOLN
INTRAMUSCULAR | Status: AC
  Filled 2017-08-16: qty 1

## 2017-08-16 MED ORDER — ONDANSETRON HCL 4 MG/2ML IJ SOLN
INTRAMUSCULAR | Status: DC | PRN
  Administered 2017-08-16: 4 mg via INTRAVENOUS

## 2017-08-16 MED ORDER — PROMETHAZINE HCL 25 MG/ML IJ SOLN
6.2500 mg | INTRAMUSCULAR | Status: DC | PRN
  Filled 2017-08-16: qty 1

## 2017-08-16 MED ORDER — KETOROLAC TROMETHAMINE 30 MG/ML IJ SOLN
INTRAMUSCULAR | Status: DC | PRN
  Administered 2017-08-16: 30 mg via INTRAVENOUS

## 2017-08-16 MED ORDER — BELLADONNA ALKALOIDS-OPIUM 16.2-60 MG RE SUPP
RECTAL | Status: AC
  Filled 2017-08-16: qty 1

## 2017-08-16 MED ORDER — ONDANSETRON HCL 4 MG/2ML IJ SOLN
INTRAMUSCULAR | Status: AC
  Filled 2017-08-16: qty 2

## 2017-08-16 MED ORDER — LIDOCAINE 2% (20 MG/ML) 5 ML SYRINGE
INTRAMUSCULAR | Status: DC | PRN
  Administered 2017-08-16: 100 mg via INTRAVENOUS

## 2017-08-16 MED ORDER — MIDAZOLAM HCL 5 MG/5ML IJ SOLN
INTRAMUSCULAR | Status: DC | PRN
  Administered 2017-08-16: 2 mg via INTRAVENOUS

## 2017-08-16 MED ORDER — FENTANYL CITRATE (PF) 100 MCG/2ML IJ SOLN
25.0000 ug | INTRAMUSCULAR | Status: DC | PRN
  Filled 2017-08-16: qty 1

## 2017-08-16 MED ORDER — SODIUM CHLORIDE 0.9 % IV SOLN
INTRAVENOUS | Status: DC
  Administered 2017-08-16: 06:00:00 via INTRAVENOUS
  Filled 2017-08-16: qty 1000

## 2017-08-16 MED ORDER — LIDOCAINE 2% (20 MG/ML) 5 ML SYRINGE
INTRAMUSCULAR | Status: AC
  Filled 2017-08-16: qty 5

## 2017-08-16 MED ORDER — SODIUM CHLORIDE 0.9 % IR SOLN
Status: DC | PRN
  Administered 2017-08-16: 4000 mL

## 2017-08-16 MED ORDER — CEFTRIAXONE SODIUM 2 G IJ SOLR
INTRAMUSCULAR | Status: AC
  Filled 2017-08-16: qty 2

## 2017-08-16 MED ORDER — EPHEDRINE 5 MG/ML INJ
INTRAVENOUS | Status: AC
  Filled 2017-08-16: qty 10

## 2017-08-16 MED ORDER — DEXTROSE 5 % IV SOLN
2.0000 g | INTRAVENOUS | Status: AC
  Administered 2017-08-16: 2 g via INTRAVENOUS
  Filled 2017-08-16: qty 2

## 2017-08-16 SURGICAL SUPPLY — 32 items
BAG DRAIN URO-CYSTO SKYTR STRL (DRAIN) ×3 IMPLANT
BASKET DAKOTA 1.9FR 11X120 (BASKET) IMPLANT
BASKET STONE 1.7 NGAGE (UROLOGICAL SUPPLIES) ×3 IMPLANT
CATH INTERMIT  6FR 70CM (CATHETERS) ×3 IMPLANT
CLOTH BEACON ORANGE TIMEOUT ST (SAFETY) ×3 IMPLANT
EVACUATOR MICROVAS BLADDER (UROLOGICAL SUPPLIES) IMPLANT
FIBER LASER FLEXIVA 1000 (UROLOGICAL SUPPLIES) IMPLANT
FIBER LASER FLEXIVA 200 (UROLOGICAL SUPPLIES) IMPLANT
FIBER LASER FLEXIVA 365 (UROLOGICAL SUPPLIES) IMPLANT
FIBER LASER FLEXIVA 550 (UROLOGICAL SUPPLIES) IMPLANT
FIBER LASER TRAC TIP (UROLOGICAL SUPPLIES) ×3 IMPLANT
GLOVE BIO SURGEON STRL SZ8 (GLOVE) ×3 IMPLANT
GLOVE BIOGEL PI IND STRL 7.5 (GLOVE) ×2 IMPLANT
GLOVE BIOGEL PI INDICATOR 7.5 (GLOVE) ×4
GOWN STRL REUS W/TWL LRG LVL3 (GOWN DISPOSABLE) IMPLANT
GOWN STRL REUS W/TWL XL LVL3 (GOWN DISPOSABLE) ×6 IMPLANT
GUIDEWIRE ANG ZIPWIRE 038X150 (WIRE) ×3 IMPLANT
GUIDEWIRE STR DUAL SENSOR (WIRE) ×3 IMPLANT
INFUSOR MANOMETER BAG 3000ML (MISCELLANEOUS) ×3 IMPLANT
IV NS 1000ML (IV SOLUTION) ×2
IV NS 1000ML BAXH (IV SOLUTION) ×1 IMPLANT
IV NS IRRIG 3000ML ARTHROMATIC (IV SOLUTION) ×3 IMPLANT
KIT RM TURNOVER CYSTO AR (KITS) ×3 IMPLANT
MANIFOLD NEPTUNE II (INSTRUMENTS) ×3 IMPLANT
NS IRRIG 500ML POUR BTL (IV SOLUTION) ×3 IMPLANT
PACK CYSTO (CUSTOM PROCEDURE TRAY) ×3 IMPLANT
SHEATH ACCESS URETERAL 38CM (SHEATH) ×3 IMPLANT
STENT URET 6FRX26 CONTOUR (STENTS) ×3 IMPLANT
SYRINGE 10CC LL (SYRINGE) ×3 IMPLANT
TUBE CONNECTING 12'X1/4 (SUCTIONS)
TUBE CONNECTING 12X1/4 (SUCTIONS) IMPLANT
TUBE FEEDING 8FR 16IN STR KANG (MISCELLANEOUS) IMPLANT

## 2017-08-16 NOTE — Transfer of Care (Signed)
Last Vitals:  Vitals:   08/16/17 0610 08/16/17 0856  BP: 104/69   Pulse: 85   Resp: 18 (P) 16  Temp: 37 C 36.4 C  SpO2: 97% (P) 100%    Last Pain:  Vitals:   08/16/17 0610  TempSrc: Oral      Patients Stated Pain Goal: 10 (08/16/17 4497)  Immediate Anesthesia Transfer of Care Note  Patient: Krista Armstrong  Procedure(s) Performed: Procedure(s) (LRB): CYSTOSCOPY WITH RETROGRADE PYELOGRAM, URETEROSCOPY , LASER LITHOTRIPSY, STONE BASKETRY AND STENT REPLACEMENT (Left) HOLMIUM LASER APPLICATION (Left)  Patient Location: PACU  Anesthesia Type: General  Level of Consciousness: awake, alert  and oriented  Airway & Oxygen Therapy: Patient Spontanous Breathing and Patient connected to nasal cannula oxygen  Post-op Assessment: Report given to PACU RN and Post -op Vital signs reviewed and stable  Post vital signs: Reviewed and stable  Complications: No apparent anesthesia complications

## 2017-08-16 NOTE — Op Note (Signed)
.  Preoperative diagnosis: Left ureteral stone  Postoperative diagnosis: Same  Procedure: 1 cystoscopy 2. Left retrograde pyelography 3.  Intraoperative fluoroscopy, under one hour, with interpretation 4.  Left ureteroscopic stone manipulation with laser lithotripsy 5.  Left 6 x 26 JJ stent exchnage  Attending: Rosie Fate  Anesthesia: General  Estimated blood loss: None  Drains: Left 6 x 26 JJ ureteral stent without tether  Specimens: stone for analysis  Antibiotics: rocephin  Findings: left UPJ calculus. No hydronephrosis. No masses/lesions in the bladder. Ureteral orifices in normal anatomic location.  Indications: Patient is a 66 year old female with a history of left renal stone and who has persistent left flank pain. She is status post left stent placement for sepsis.  After discussing treatment options, she decided proceed with left ureteroscopic stone manipulation.  Procedure her in detail: The patient was brought to the operating room and a brief timeout was done to ensure correct patient, correct procedure, correct site.  General anesthesia was administered patient was placed in dorsal lithotomy position.  Her genitalia was then prepped and draped in usual sterile fashion.  A rigid 52 French cystoscope was passed in the urethra and the bladder.  Bladder was inspected free masses or lesions.  the ureteral orifices were in the normal orthotopic locations. Using a grasper the left ureteral stent was brought to the urethral meatus. A zipwire was then advanced up to the renal pelvis. The stent was then removed a 6 french ureteral catheter was then instilled into the left ureteral orifice.  a gentle retrograde was obtained and findings noted above. we then removed the cystoscope and cannulated the left ureteral orifice with a semirigid ureteroscope.  No stone was found in the ureter. Once we reached the UPJ a sensor wire was advanced in to the renal pelvis. We then removed the  ureteroscope and advanced am 12/14 x 38cm access sheath up to the renal pelvis. We then used the flexible ureteroscope to perform nephroscopy. We encountered the stone in the lower pole.   Using a 200nm laser fivber the stone was fragmented. the pieces were then removed with a Ngage basket.    once all stone fragments were removed we then removed the access sheath under direct vision and noted no injury to the ureter. We then placed a 6 x 26 double-j ureteral stent over the original zip wire.  We then removed the wire and good coil was noted in the the renal pelvis under fluoroscopy and the bladder under direct vision. the bladder was then drained and this concluded the procedure which was well tolerated by patient.  Complications: None  Condition: Stable, extubated, transferred to PACU  Plan: Patient is to be discharged home as to follow-up in one week. She is to remove her stent by pulling the tether in 72 hours

## 2017-08-16 NOTE — Anesthesia Procedure Notes (Signed)
Procedure Name: LMA Insertion Date/Time: 08/16/2017 7:47 AM Performed by: Suzette Battiest Pre-anesthesia Checklist: Patient identified, Emergency Drugs available, Suction available and Patient being monitored Patient Re-evaluated:Patient Re-evaluated prior to induction Oxygen Delivery Method: Circle system utilized Preoxygenation: Pre-oxygenation with 100% oxygen Induction Type: IV induction Ventilation: Mask ventilation without difficulty LMA: LMA inserted LMA Size: 4.0 Number of attempts: 1 Airway Equipment and Method: Bite block Placement Confirmation: positive ETCO2 Tube secured with: Tape Dental Injury: Teeth and Oropharynx as per pre-operative assessment

## 2017-08-16 NOTE — Discharge Instructions (Signed)
Ureteral Stent Implantation, Care After Refer to this sheet in the next few weeks. These instructions provide you with information about caring for yourself after your procedure. Your health care provider may also give you more specific instructions. Your treatment has been planned according to current medical practices, but problems sometimes occur. Call your health care provider if you have any problems or questions after your procedure. What can I expect after the procedure? After the procedure, it is common to have:  Nausea.  Mild pain when you urinate. You may feel this pain in your lower back or lower abdomen. Pain should stop within a few minutes after you urinate. This may last for up to 1 week.  A small amount of blood in your urine for several days.  Follow these instructions at home:  Medicines  Take over-the-counter and prescription medicines only as told by your health care provider.  If you were prescribed an antibiotic medicine, take it as told by your health care provider. Do not stop taking the antibiotic even if you start to feel better.  Do not drive for 24 hours if you received a sedative.  Do not drive or operate heavy machinery while taking prescription pain medicines. Activity  Return to your normal activities as told by your health care provider. Ask your health care provider what activities are safe for you.  Do not lift anything that is heavier than 10 lb (4.5 kg). Follow this limit for 1 week after your procedure, or for as long as told by your health care provider. General instructions  Watch for any blood in your urine. Call your health care provider if the amount of blood in your urine increases.  If you have a catheter: ? Follow instructions from your health care provider about taking care of your catheter and collection bag. ? Do not take baths, swim, or use a hot tub until your health care provider approves.  Drink enough fluid to keep your urine  clear or pale yellow.  Keep all follow-up visits as told by your health care provider. This is important. Contact a health care provider if:  You have pain that gets worse or does not get better with medicine, especially pain when you urinate.  You have difficulty urinating.  You feel nauseous or you vomit repeatedly during a period of more than 2 days after the procedure. Get help right away if:  Your urine is dark red or has blood clots in it.  You are leaking urine (have incontinence).  The end of the stent comes out of your urethra.  You cannot urinate.  You have sudden, sharp, or severe pain in your abdomen or lower back.  You have a fever. This information is not intended to replace advice given to you by your health care provider. Make sure you discuss any questions you have with your health care provider. Document Released: 07/08/2013 Document Revised: 04/12/2016 Document Reviewed: 05/20/2015 Elsevier Interactive Patient Education  2018 [place] REMOVE THE URETERAL STENT IN 72 HOURS BY GENTLY PULLING THE STRING  CYSTOSCOPY HOME CARE INSTRUCTIONS  Activity: Rest for the remainder of the day.  Do not drive or operate equipment today.  You may resume normal activities in one to two days as instructed by your physician.   Meals: Drink plenty of liquids and eat light foods such as gelatin or soup this evening.  You may return to a normal meal plan tomorrow.  Return to Work: You may return to  work in one to two days or as instructed by your physician.  Special Instructions / Symptoms: Call your physician if any of these symptoms occur:   -persistent or heavy bleeding  -bleeding which continues after first few urination  -large blood clots that are difficult to pass  -urine stream diminishes or stops completely  -fever equal to or higher than 101 degrees Farenheit.  -cloudy urine with a strong, foul odor  -severe pain  Females should always wipe from  front to back after elimination.  You may feel some burning pain when you urinate.  This should disappear with time.  Applying moist heat to the lower abdomen or a hot tub bath may help relieve the pain. \  Follow-Up / Date of Return Visit to Your Physician:  As instructed Call for an appointment to arrange follow-up.  Patient Signature:  ________________________________________________________  Nurse's Signature:  ________________________________________________________  Post Anesthesia Home Care Instructions  Activity: Get plenty of rest for the remainder of the day. A responsible individual must stay with you for 24 hours following the procedure.  For the next 24 hours, DO NOT: -Drive a car -Paediatric nurse -Drink alcoholic beverages -Take any medication unless instructed by your physician -Make any legal decisions or sign important papers.  Meals: Start with liquid foods such as gelatin or soup. Progress to regular foods as tolerated. Avoid greasy, spicy, heavy foods. If nausea and/or vomiting occur, drink only clear liquids until the nausea and/or vomiting subsides. Call your physician if vomiting continues.  Special Instructions/Symptoms: Your throat may feel dry or sore from the anesthesia or the breathing tube placed in your throat during surgery. If this causes discomfort, gargle with warm salt water. The discomfort should disappear within 24 hours.  If you had a scopolamine patch placed behind your ear for the management of post- operative nausea and/or vomiting:  1. The medication in the patch is effective for 72 hours, after which it should be removed.  Wrap patch in a tissue and discard in the trash. Wash hands thoroughly with soap and water. 2. You may remove the patch earlier than 72 hours if you experience unpleasant side effects which may include dry mouth, dizziness or visual disturbances. 3. Avoid touching the patch. Wash your hands with soap and water after contact  with the patch.

## 2017-08-16 NOTE — Interval H&P Note (Signed)
History and Physical Interval Note:  08/16/2017 7:38 AM  Krista Armstrong  has presented today for surgery, with the diagnosis of LEFT URETERAL CALCULUS  The various methods of treatment have been discussed with the patient and family. After consideration of risks, benefits and other options for treatment, the patient has consented to  Procedure(s): CYSTOSCOPY WITH RETROGRADE PYELOGRAM, URETEROSCOPY AND STENT REPLACEMENT (Left) HOLMIUM LASER APPLICATION (Left) as a surgical intervention .  The patient's history has been reviewed, patient examined, no change in status, stable for surgery.  I have reviewed the patient's chart and labs.  Questions were answered to the patient's satisfaction.     Nicolette Bang

## 2017-08-16 NOTE — H&P (View-Only) (Signed)
Urology Admission H&P  Chief Complaint: left flank pain  History of Present Illness: Krista Armstrong is a 66yo with a  Hx of breast cancer who presented to [facility] ER with a 12 hour history of severe, sharp, constant, nonradiating left flank pain. She has associated nausea and vomiting. This is her first stone event. She has urinary frequency, urgency and dysuria. No exacerbating/allevaiting events. No other associated symptoms. CT scan showed a 65mm Left UPJ calculus with moderate hydronephrosis. WBC count was 13. While int he Er she developed a fever of 102.9, tachycardia and hypotension.   Past Medical History:  Diagnosis Date  . Abnormal uterine bleeding   . 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 Chronic back pain   . Colon polyps   . Family history of breast cancer   . GERD (gastroesophageal reflux disease)   . Hot flashes   . Hypertension   . Radiation 07/07/15-08/09/15   Left breast  . Ruptured lumbar disc   . Transfusion history   . Wears glasses    Past Surgical History:  Procedure Laterality Date  . BACK SURGERY  2000, 2009  . BACK SURGERY    . BREAST BIOPSY Left 05/03/2015   U/S Core- Benign  . BREAST BIOPSY Right    U/S Core- Benign  . BREAST LUMPECTOMY Left 05/30/2015  . BREAST SURGERY    . colon polyps    . DILATATION & CURETTAGE/HYSTEROSCOPY WITH MYOSURE N/A 02/05/2017   Procedure: DILATATION & CURETTAGE/HYSTEROSCOPY WITH MYOSURE;  Surgeon: [name], MD;  Location: [facility];  Service: Gynecology;  Laterality: N/A;  . RADIOACTIVE SEED GUIDED MASTECTOMY WITH AXILLARY SENTINEL LYMPH NODE BIOPSY Left 05/30/2015   Procedure: LEFT PARTIAL MASTECTOMY WITH RADIOACTIVE SEED LOCALIZATION, LEFT AXILLARY SENTINEL LYMPH NODE BIOPSY;  Surgeon: Fanny Skates, MD;  Location: [place];  Service: General;  Laterality: Left;  . ruptured lumbar disc    . STOMACH SURGERY  2006   tumors excised    Home  Medications:   (Not in a hospital admission) Allergies:  Allergies  Allergen Reactions  . Vicodin [Hydrocodone-Acetaminophen] Other (See Comments)    Massive headache    Family History  Problem Relation Age of Onset  . Breast cancer Mother 23  . Stroke Father   . Heart disease Maternal Grandmother   . Cervical cancer Maternal Aunt   . Colon cancer Neg Hx   . Colon polyps Neg Hx   . Diabetes Neg Hx   . Kidney disease Neg Hx   . Esophageal cancer Neg Hx   . Gallbladder disease Neg Hx    Social History:  reports that she has never smoked. She has never used smokeless tobacco. She reports that she drinks about 4.2 oz of alcohol per week . She reports that she does not use drugs.  Review of Systems  Constitutional: Positive for chills, fever and malaise/fatigue.  Gastrointestinal: Positive for nausea and vomiting.  Genitourinary: Positive for dysuria, flank pain, frequency and urgency.  All other systems reviewed and are negative.   Physical Exam:  Vital signs in last 24 hours: Temp:  [98.9 F (37.2 C)-102.9 F (39.4 C)] 102.9 F (39.4 C) (09/02 1953) Pulse Rate:  [93-114] 114 (09/02 2100) Resp:  [18] 18 (09/02 1953) BP: (92-130)/(45-86) 108/65 (09/02 2100) SpO2:  [91 %-100 %] 92 % (09/02 2100) Weight:  [72.6 kg (160 lb)] 72.6 kg (160 lb) (09/02 1305) Physical Exam  Constitutional: She is  oriented to person, place, and time. She appears well-developed and well-nourished.  HENT:  Head: Normocephalic and atraumatic.  Eyes: Pupils are equal, round, and reactive to light. EOM are normal.  Neck: Normal range of motion. No thyromegaly present.  Cardiovascular: Normal rate and regular rhythm.   Respiratory: Effort normal. No respiratory distress.  GI: Soft. She exhibits no distension.  Musculoskeletal: Normal range of motion. She exhibits no edema.  Neurological: She is alert and oriented to person, place, and time.  Skin: Skin is warm and dry.  Psychiatric: She has a  normal mood and affect. Her behavior is normal. Judgment and thought content normal.    Laboratory Data:  Results for orders placed or performed during the hospital encounter of 07/21/17 (from the past 24 hour(s))  Lipase, blood     Status: None   Collection Time: 07/21/17  1:13 PM  Result Value Ref Range   Lipase 22 11 - 51 U/L  Comprehensive metabolic panel     Status: Abnormal   Collection Time: 07/21/17  1:13 PM  Result Value Ref Range   Sodium 139 135 - 145 mmol/L   Potassium 3.7 3.5 - 5.1 mmol/L   Chloride 105 101 - 111 mmol/L   CO2 22 22 - 32 mmol/L   Glucose, Bld 130 (H) 65 - 99 mg/dL   BUN 12 6 - 20 mg/dL   Creatinine, Ser 1.05 (H) 0.44 - 1.00 mg/dL   Calcium 8.6 (L) 8.9 - 10.3 mg/dL   Total Protein 7.1 6.5 - 8.1 g/dL   Albumin 3.8 3.5 - 5.0 g/dL   AST 32 15 - 41 U/L   ALT 19 14 - 54 U/L   Alkaline Phosphatase 28 (L) 38 - 126 U/L   Total Bilirubin 0.7 0.3 - 1.2 mg/dL   GFR calc non Af Amer 55 (L) >60 mL/min   GFR calc Af Amer >60 >60 mL/min   Anion gap 12 5 - 15  CBC     Status: Abnormal   Collection Time: 07/21/17  1:13 PM  Result Value Ref Range   WBC 13.4 (H) 4.0 - 10.5 K/uL   RBC 3.89 3.87 - 5.11 MIL/uL   Hemoglobin 11.3 (L) 12.0 - 15.0 g/dL   HCT 34.4 (L) 36.0 - 46.0 %   MCV 88.4 78.0 - 100.0 fL   MCH 29.0 26.0 - 34.0 pg   MCHC 32.8 30.0 - 36.0 g/dL   RDW 13.4 11.5 - 15.5 %   Platelets 224 150 - 400 K/uL  Urinalysis, Routine w reflex microscopic     Status: Abnormal   Collection Time: 07/21/17  1:20 PM  Result Value Ref Range   Color, Urine YELLOW YELLOW   APPearance HAZY (A) CLEAR   Specific Gravity, Urine 1.016 1.005 - 1.030   pH 5.0 5.0 - 8.0   Glucose, UA NEGATIVE NEGATIVE mg/dL   Hgb urine dipstick MODERATE (A) NEGATIVE   Bilirubin Urine NEGATIVE NEGATIVE   Ketones, ur 5 (A) NEGATIVE mg/dL   Protein, ur NEGATIVE NEGATIVE mg/dL   Nitrite POSITIVE (A) NEGATIVE   Leukocytes, UA LARGE (A) NEGATIVE   RBC / HPF 6-30 0 - 5 RBC/hpf   WBC, UA 6-30 0 -  5 WBC/hpf   Bacteria, UA MANY (A) NONE SEEN   Squamous Epithelial / LPF 0-5 (A) NONE SEEN   Mucus PRESENT   I-Stat CG4 Lactic Acid, ED     Status: Abnormal   Collection Time: 07/21/17  8:19 PM  Result Value Ref  Range   Lactic Acid, Venous 2.68 (HH) 0.5 - 1.9 mmol/L   Comment NOTIFIED PHYSICIAN    No results found for this or any previous visit (from the past 240 hour(s)). Creatinine:  Recent Labs  07/21/17 1313  CREATININE 1.05*   Baseline Creatinine: 1  Impression/Assessment:  66yo with left ureteral calculus, sepsis from a urinary source  Plan:  1. The patient will be taken urgently to the OR for left ureteral stent placement. The risks/benefits/alternatives to left ureteral stent placement was explained to the patient and she understands and wishes to proceed with surgery 2. She will be admitted post op for IV antibiotics and fluid rescusitation.   Krista Armstrong 07/21/2017, 9:55 PM

## 2017-08-16 NOTE — Anesthesia Postprocedure Evaluation (Signed)
Anesthesia Post Note  Patient: [name]  Procedure(s) Performed: Procedure(s) (LRB): CYSTOSCOPY WITH RETROGRADE PYELOGRAM, URETEROSCOPY , LASER LITHOTRIPSY, STONE BASKETRY AND STENT REPLACEMENT (Left) HOLMIUM LASER APPLICATION (Left)     Patient location during evaluation: PACU Anesthesia Type: General Level of consciousness: awake and alert Pain management: pain level controlled Vital Signs Assessment: post-procedure vital signs reviewed and stable Respiratory status: spontaneous breathing, nonlabored ventilation, respiratory function stable and patient connected to nasal cannula oxygen Cardiovascular status: blood pressure returned to baseline and stable Postop Assessment: no apparent nausea or vomiting Anesthetic complications: no    Last Vitals:  Vitals:   08/16/17 0856 08/16/17 0900  BP: 110/69 112/65  Pulse: (!) 105 (!) 104  Resp: 16 15  Temp: 36.4 C   SpO2: 100% 100%    Last Pain:  Vitals:   08/16/17 0856  TempSrc:   PainSc: Tyler Deis

## 2017-08-19 ENCOUNTER — Encounter (HOSPITAL_BASED_OUTPATIENT_CLINIC_OR_DEPARTMENT_OTHER): Payer: Self-pay | Admitting: Urology

## 2017-11-24 NOTE — Progress Notes (Signed)
Medicare Annual Preventive Care Visit  (initial annual wellness or annual wellness exam)  Concerns and/or follow up today: PMH breast cancer (sees oncology for management), nephrolithiasis (sees urology), hypertension. She reports her oncologist has also prescribe gabapentin for sleep - denies neuropathy. Reports used to take xanax.  Due for labs, pneumonia and flu vaccines.  See HM section in Epic for other details of completed HM. See scanned documentation under Media Tab for further documentation HPI, health risk assessment. See Media Tab and Care Teams sections in Epic for other providers.  ROS: negative for report of fevers, unintentional weight loss, vision changes, vision loss, hearing loss or change, chest pain, sob, hemoptysis, melena, hematochezia, hematuria, genital discharge or lesions, falls, bleeding or bruising, loc, thoughts of suicide or self harm, memory loss  1.) Patient-completed health risk assessment  - completed and reviewed, see scanned documentation  2.) Review of Medical History: -PMH, PSH, Family History and current specialty and care providers reviewed and updated and listed below  - see scanned in document in chart and below  Past Medical History:  Diagnosis Date  . Breast cancer ([facility]) DX 05/03/15---  oncologiist-  dr [name]--- per LOV note -- no recurrence   Stage 1A ( T1b, N0, M0), Grade 1,  ER/PR +,  invasive DCIS left outer quadrant--  05-30-2015  s/p  left partial mastectomy and snl dissection x1/  completed radiation 08-09-2015   . Family history of breast cancer   . GERD (gastroesophageal reflux disease)   . Hiatal hernia   . History of adenomatous polyp of colon    03-04-2015  tubular adenom's  . History of radiation therapy 07-07-2015  to 08-09-2015   left breast 42.72Gy @ 2.67Gy per 16 fractions and boost 10Gy @ 2Gy per 5 fractions  . 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 History  of sepsis 07/21/2017   left hydronephrosis due to left ureter stone obstruction  . Hot flashes   . Hypertension   . Left ureteral stone   . Wears glasses     Past Surgical History:  Procedure Laterality Date  . BREAST BIOPSY Left 05/03/2015   U/S Core- Benign  . BREAST BIOPSY Right    U/S Core- Benign  . CHOLECYSTECTOMY OPEN  2005  . COLONOSCOPY WITH ESOPHAGOGASTRODUODENOSCOPY (EGD)  03-04-2015   dr [name]  . CYSTOSCOPY WITH RETROGRADE PYELOGRAM, URETEROSCOPY AND STENT PLACEMENT Left 07/21/2017   Procedure: CYSTOSCOPY WITH RETROGRADE PYELOGRAM, URETEROSCOPY AND STENT PLACEMENT;  Surgeon: [name], MD;  Location: [facility];  Service: Urology;  Laterality: Left;  . CYSTOSCOPY WITH RETROGRADE PYELOGRAM, URETEROSCOPY AND STENT PLACEMENT Left 08/16/2017   Procedure: CYSTOSCOPY WITH RETROGRADE PYELOGRAM, URETEROSCOPY , LASER LITHOTRIPSY, STONE BASKETRY AND STENT REPLACEMENT;  Surgeon: [name], MD;  Location: [facility];  Service: Urology;  Laterality: Left;  . DILATATION & CURETTAGE/HYSTEROSCOPY WITH MYOSURE N/A 02/05/2017   Procedure: DILATATION & CURETTAGE/HYSTEROSCOPY WITH MYOSURE;  Surgeon: [name], MD;  Location: [facility];  Service: Gynecology;  Laterality: N/A;  . GASTRIC RESECTION  2006   wedge resection for two GIST tumors (benign)  . HOLMIUM LASER APPLICATION Left 4/62/7035   Procedure: HOLMIUM LASER APPLICATION;  Surgeon: [name], MD;  Location: [facility];  Service: Urology;  Laterality: Left;  . LUMBAR LAMINECTOMY  2000;  2009  . RADIOACTIVE SEED GUIDED PARTIAL MASTECTOMY WITH AXILLARY SENTINEL LYMPH  NODE BIOPSY Left 05/30/2015   Procedure: LEFT PARTIAL MASTECTOMY WITH RADIOACTIVE SEED LOCALIZATION, LEFT AXILLARY SENTINEL LYMPH NODE BIOPSY;  Surgeon: Fanny Skates, MD;  Location: [place];  Service: General;  Laterality: Left;  . TRANSTHORACIC ECHOCARDIOGRAM  07/06/2010    normal echo; ef 70%    Social History   Socioeconomic History  . Marital status: Married    Spouse name: Not on file  . Number of children: 3  . Years of education: Not on file  . Highest education level: Not on file  Social Needs  . Financial resource strain: Not on file  . Food insecurity - worry: Not on file  . Food insecurity - inability: Not on file  . Transportation needs - medical: Not on file  . Transportation needs - non-medical: Not on file  Occupational History  . Occupation: Surveyor, mining  Tobacco Use  . Smoking status: Never Smoker  . Smokeless tobacco: Never Used  Substance and Sexual Activity  . Alcohol use: Yes    Alcohol/week: 4.2 oz    Types: 7 Glasses of wine per week    Comment: wine every  night   . Drug use: No  . Sexual activity: Yes    Partners: Male    Birth control/protection: Post-menopausal  Other Topics Concern  . Not on file  Social History Narrative  . Not on file    Family History  Problem Relation Age of Onset  . Breast cancer Mother 10  . Stroke Father   . Heart disease Maternal Grandmother   . Cervical cancer Maternal Aunt   . Colon cancer Neg Hx   . Colon polyps Neg Hx   . Diabetes Neg Hx   . Kidney disease Neg Hx   . Esophageal cancer Neg Hx   . Gallbladder disease Neg Hx     Current Outpatient Medications on File Prior to Visit  Medication Sig Dispense Refill  . amLODipine (NORVASC) 5 MG tablet TAKE 1 TABLET (5 MG TOTAL) BY MOUTH DAILY. 90 tablet 3  . esomeprazole (NEXIUM) 40 MG capsule Take 1 capsule (40 mg total) by mouth daily. 90 capsule 1  . gabapentin (NEURONTIN) 300 MG capsule Take 2 capsules (600 mg total) by mouth at bedtime. 180 capsule 3  . losartan (COZAAR) 50 MG tablet TAKE 1 TABLET (50 MG TOTAL) BY MOUTH DAILY. *MAX DAY SUPPLY PER INSURANCE* 90 tablet 3  . tamoxifen (NOLVADEX) 20 MG tablet Take 1 tablet (20 mg total) by mouth daily. 90 tablet 3   No current facility-administered medications on file prior  to visit.    I do not prescribe opioids for this patient.   3.) Review of functional ability and level of safety:  Any difficulty hearing?  See scanned documentation  History of falling?  See scanned documentation  Any trouble with IADLs - using a phone, using transportation, grocery shopping, preparing meals, doing housework, doing laundry, taking medications and managing money?  See scanned documentation  Advance Directives? Yes, she does not wish to change  See summary of recommendations in Patient Instructions below.  4.) Physical Exam Vitals:   11/25/17 0934  BP: 116/80  Pulse: 72  Temp: 98.6 F (37 C)   Estimated body mass index is 25.51 kg/m as calculated from the following:   Height as of this encounter: '5\' 7"'  (1.702 m).   Weight as of this encounter: 162 lb 14.4 oz (73.9 kg).  EKG (optional): deferred  General: alert, appear well hydrated and in  no acute distress  HEENT: visual acuity grossly intact, normal inspection of your canals and oropharynx  CV: HRRR, no LE edema  Lungs: CTA bilaterally  ABD soft, NTTP  Psych/NEURO: pleasant and cooperative, no obvious depression or anxiety, processing grossly intact, gait is normal  MS: Normal functioning of all extremities on gross exam  Cognitive function grossly intact  See patient instructions for recommendations.  Education and counseling regarding the above review of health provided with a plan for the following: -see scanned patient completed form for further details -fall prevention strategies discussed  -healthy lifestyle discussed -importance and resources for completing advanced directives discussed -see patient instructions below for any other recommendations provided  4)The following written screening schedule of preventive measures were reviewed with assessment and plan made per below, orders and patient instructions:      AAA screening done if applicable     Alcohol screening done      Obesity Screening and counseling done     STI screening (Hep C if born 1944-65) offered and per pt wishes     Tobacco Screening done        Pneumococcal (PPSV23 -one dose after 64, one before if risk factors), influenza yearly and hepatitis B vaccines (if high risk - end stage renal disease, IV drugs, homosexual men, live in home for mentally retarded, hemophilia receiving factors) ASSESSMENT/PLAN: Prevnar 29 today, PPS V 23 next year, flu shot today      Screening mammograph (yearly if >40) ASSESSMENT/PLAN: utd -sees oncologist for breast care and mammograms      Screening Pap smear/pelvic exam (q2 years) ASSESSMENT/PLAN: n/a, declined, sees GYN      Colorectal cancer screening (FOBT yearly or flex sig q4y or colonoscopy q10y or barium enema q4y) ASSESSMENT/PLAN: utd       Diabetes outpatient self-management training services ASSESSMENT/PLAN: utd or done      Bone mass measurements(covered q2y if indicated - estrogen def, osteoporosis, hyperparathyroid, vertebral abnormalities, osteoporosis or steroids) ASSESSMENT/PLAN: utd or discussed and ordered per pt wishes      Screening for glaucoma(q1y if high risk - diabetes, FH, AA and > 50 or hispanic and > 65) ASSESSMENT/PLAN: utd or advised      Medical nutritional therapy for individuals with diabetes or renal disease ASSESSMENT/PLAN: see orders      Cardiovascular screening blood tests (lipids q5y) ASSESSMENT/PLAN: see orders and labs      Diabetes screening tests ASSESSMENT/PLAN: see orders and labs   7.) Summary:   Welcome to Medicare preventive visit - Plan: Hemoglobin A1c, Lipid panel -risk factors and conditions per above assessment were discussed and treatment, recommendations and referrals were offered per documentation above and orders and patient instructions.  Screening for depression -negative  Essential hypertension - Plan: Basic metabolic panel, CBC  Malignant neoplasm of lower-outer quadrant of left breast  of female, estrogen receptor positive ([place]) -sees oncologist for management  Poor sleep -sees oncologist for management, reports oncologist rxd Neurontin for this -we discussed other tx options for sleep and side effects, she plans to discuss with onc  Patient Instructions   BEFORE YOU LEAVE: -Prevnar 13 and flu shot -Labs -Annual wellness visit questionnaire -follow up: 3-4 months   Ms. Skora , Thank you for taking time to come for your Medicare Wellness Visit. I appreciate your ongoing commitment to your health goals. Please review the following plan we discussed and let me know if I can assist you in the future.   These are the goals  we discussed: Goals    Update vaccines Labs      This is a list of the screening recommended for you and due dates:  Health Maintenance  Topic Date Due  . HIV Screening  Deferred on prior  . Colon Cancer Screening  03/03/2018  . Pneumonia vaccines (2 of 2 - PPSV23) 11/25/2018  . Mammogram  04/24/2019  . DEXA scan (bone density measurement)  04/24/2019  . Pap Smear  01/09/2020  . Tetanus Vaccine  08/18/2022  . Flu Shot  Completed  .  Hepatitis C: One time screening is recommended by Center for Disease Control  (CDC) for  adults born from [birth year] through 1964.   Completed  *Topic was postponed. The date shown is not the original due date.    Health Maintenance for Postmenopausal Women Menopause is a normal process in which your reproductive ability comes to an end. This process happens gradually over a span of months to years, usually between the ages of 68 and 110. Menopause is complete when you have missed 12 consecutive menstrual periods. It is important to talk with your health care provider about some of the most common conditions that affect postmenopausal women, such as heart disease, cancer, and bone loss (osteoporosis). Adopting a healthy lifestyle and getting preventive care can help to promote your health and wellness. Those actions can  also lower your chances of developing some of these common conditions. What should I know about menopause? During menopause, you may experience a number of symptoms, such as:  Moderate-to-severe hot flashes.  Night sweats.  Decrease in sex drive.  Mood swings.  Headaches.  Tiredness.  Irritability.  Memory problems.  Insomnia.  Choosing to treat or not to treat menopausal changes is an individual decision that you make with your health care provider. What should I know about hormone replacement therapy and supplements? Hormone therapy products are effective for treating symptoms that are associated with menopause, such as hot flashes and night sweats. Hormone replacement carries certain risks, especially as you become older. If you are thinking about using estrogen or estrogen with progestin treatments, discuss the benefits and risks with your health care provider. What should I know about heart disease and stroke? Heart disease, heart attack, and stroke become more likely as you age. This may be due, in part, to the hormonal changes that your body experiences during menopause. These can affect how your body processes dietary fats, triglycerides, and cholesterol. Heart attack and stroke are both medical emergencies. There are many things that you can do to help prevent heart disease and stroke:  Have your blood pressure checked at least every 1-2 years. High blood pressure causes heart disease and increases the risk of stroke.  If you are 46-83 years old, ask your health care provider if you should take aspirin to prevent a heart attack or a stroke.  Do not use any tobacco products, including cigarettes, chewing tobacco, or electronic cigarettes. If you need help quitting, ask your health care provider.  It is important to eat a healthy diet and maintain a healthy weight. ? Be sure to include plenty of vegetables, fruits, low-fat dairy products, and lean protein. ? Avoid eating  foods that are high in solid fats, added sugars, or salt (sodium).  Get regular exercise. This is one of the most important things that you can do for your health. ? Try to exercise for at least 150 minutes each week. The type of exercise that you do should increase  your heart rate and make you sweat. This is known as moderate-intensity exercise. ? Try to do strengthening exercises at least twice each week. Do these in addition to the moderate-intensity exercise.  Know your numbers.Ask your health care provider to check your cholesterol and your blood glucose. Continue to have your blood tested as directed by your health care provider.  What should I know about cancer screening? There are several types of cancer. Take the following steps to reduce your risk and to catch any cancer development as early as possible. Breast Cancer  Practice breast self-awareness. ? This means understanding how your breasts normally appear and feel. ? It also means doing regular breast self-exams. Let your health care provider know about any changes, no matter how small.  If you are 55 or older, have a clinician do a breast exam (clinical breast exam or CBE) every year. Depending on your age, family history, and medical history, it may be recommended that you also have a yearly breast X-ray (mammogram).  If you have a family history of breast cancer, talk with your health care provider about genetic screening.  If you are at high risk for breast cancer, talk with your health care provider about having an MRI and a mammogram every year.  Breast cancer (BRCA) gene test is recommended for women who have family members with BRCA-related cancers. Results of the assessment will determine the need for genetic counseling and BRCA1 and for BRCA2 testing. BRCA-related cancers include these types: ? Breast. This occurs in males or females. ? Ovarian. ? Tubal. This may also be called fallopian tube cancer. ? Cancer of the  abdominal or pelvic lining (peritoneal cancer). ? Prostate. ? Pancreatic.  Cervical, Uterine, and Ovarian Cancer Your health care provider may recommend that you be screened regularly for cancer of the pelvic organs. These include your ovaries, uterus, and vagina. This screening involves a pelvic exam, which includes checking for microscopic changes to the surface of your cervix (Pap test).  For women ages 21-65, health care providers may recommend a pelvic exam and a Pap test every three years. For women ages 59-65, they may recommend the Pap test and pelvic exam, combined with testing for human papilloma virus (HPV), every five years. Some types of HPV increase your risk of cervical cancer. Testing for HPV may also be done on women of any age who have unclear Pap test results.  Other health care providers may not recommend any screening for nonpregnant women who are considered low risk for pelvic cancer and have no symptoms. Ask your health care provider if a screening pelvic exam is right for you.  If you have had past treatment for cervical cancer or a condition that could lead to cancer, you need Pap tests and screening for cancer for at least 20 years after your treatment. If Pap tests have been discontinued for you, your risk factors (such as having a new sexual partner) need to be reassessed to determine if you should start having screenings again. Some women have medical problems that increase the chance of getting cervical cancer. In these cases, your health care provider may recommend that you have screening and Pap tests more often.  If you have a family history of uterine cancer or ovarian cancer, talk with your health care provider about genetic screening.  If you have vaginal bleeding after reaching menopause, tell your health care provider.  There are currently no reliable tests available to screen for ovarian cancer.  Lung  Cancer Lung cancer screening is recommended for adults  64-55 years old who are at high risk for lung cancer because of a history of smoking. A yearly low-dose CT scan of the lungs is recommended if you:  Currently smoke.  Have a history of at least 30 pack-years of smoking and you currently smoke or have quit within the past 15 years. A pack-year is smoking an average of one pack of cigarettes per day for one year.  Yearly screening should:  Continue until it has been 15 years since you quit.  Stop if you develop a health problem that would prevent you from having lung cancer treatment.  Colorectal Cancer  This type of cancer can be detected and can often be prevented.  Routine colorectal cancer screening usually begins at age 25 and continues through age 25.  If you have risk factors for colon cancer, your health care provider may recommend that you be screened at an earlier age.  If you have a family history of colorectal cancer, talk with your health care provider about genetic screening.  Your health care provider may also recommend using home test kits to check for hidden blood in your stool.  A small camera at the end of a tube can be used to examine your colon directly (sigmoidoscopy or colonoscopy). This is done to check for the earliest forms of colorectal cancer.  Direct examination of the colon should be repeated every 5-10 years until age 32. However, if early forms of precancerous polyps or small growths are found or if you have a family history or genetic risk for colorectal cancer, you may need to be screened more often.  Skin Cancer  Check your skin from head to toe regularly.  Monitor any moles. Be sure to tell your health care provider: ? About any new moles or changes in moles, especially if there is a change in a mole's shape or color. ? If you have a mole that is larger than the size of a pencil eraser.  If any of your family members has a history of skin cancer, especially at a young age, talk with your health  care provider about genetic screening.  Always use sunscreen. Apply sunscreen liberally and repeatedly throughout the day.  Whenever you are outside, protect yourself by wearing long sleeves, pants, a wide-brimmed hat, and sunglasses.  What should I know about osteoporosis? Osteoporosis is a condition in which bone destruction happens more quickly than new bone creation. After menopause, you may be at an increased risk for osteoporosis. To help prevent osteoporosis or the bone fractures that can happen because of osteoporosis, the following is recommended:  If you are 2-42 years old, get at least 1,000 mg of calcium and at least 600 mg of vitamin D per day.  If you are older than age 56 but younger than age 23, get at least 1,200 mg of calcium and at least 600 mg of vitamin D per day.  If you are older than age 62, get at least 1,200 mg of calcium and at least 800 mg of vitamin D per day.  Smoking and excessive alcohol intake increase the risk of osteoporosis. Eat foods that are rich in calcium and vitamin D, and do weight-bearing exercises several times each week as directed by your health care provider. What should I know about how menopause affects my mental health? Depression may occur at any age, but it is more common as you become older. Common symptoms of depression  include:  Low or sad mood.  Changes in sleep patterns.  Changes in appetite or eating patterns.  Feeling an overall lack of motivation or enjoyment of activities that you previously enjoyed.  Frequent crying spells.  Talk with your health care provider if you think that you are experiencing depression. What should I know about immunizations? It is important that you get and maintain your immunizations. These include:  Tetanus, diphtheria, and pertussis (Tdap) booster vaccine.  Influenza every year before the flu season begins.  Pneumonia vaccine.  Shingles vaccine.  Your health care provider may also  recommend other immunizations. This information is not intended to replace advice given to you by your health care provider. Make sure you discuss any questions you have with your health care provider. Document Released: 12/28/2005 Document Revised: 05/25/2016 Document Reviewed: 08/09/2015 Elsevier Interactive Patient Education  2018 Ben Avon., DO

## 2017-11-25 ENCOUNTER — Telehealth: Payer: Self-pay | Admitting: *Deleted

## 2017-11-25 ENCOUNTER — Ambulatory Visit (INDEPENDENT_AMBULATORY_CARE_PROVIDER_SITE_OTHER): Payer: 59 | Admitting: Family Medicine

## 2017-11-25 ENCOUNTER — Encounter: Payer: Self-pay | Admitting: Family Medicine

## 2017-11-25 VITALS — BP 116/80 | HR 72 | Temp 98.6°F | Ht 67.0 in | Wt 162.9 lb

## 2017-11-25 DIAGNOSIS — Z23 Encounter for immunization: Secondary | ICD-10-CM

## 2017-11-25 DIAGNOSIS — Z1331 Encounter for screening for depression: Secondary | ICD-10-CM | POA: Diagnosis not present

## 2017-11-25 DIAGNOSIS — Z72820 Sleep deprivation: Secondary | ICD-10-CM

## 2017-11-25 DIAGNOSIS — C50512 Malignant neoplasm of lower-outer quadrant of left female breast: Secondary | ICD-10-CM

## 2017-11-25 DIAGNOSIS — Z170 Estrogen receptor positive status [ER+]: Secondary | ICD-10-CM

## 2017-11-25 DIAGNOSIS — Z0000 Encounter for general adult medical examination without abnormal findings: Secondary | ICD-10-CM

## 2017-11-25 DIAGNOSIS — I10 Essential (primary) hypertension: Secondary | ICD-10-CM | POA: Diagnosis not present

## 2017-11-25 LAB — BASIC METABOLIC PANEL
BUN: 18 mg/dL (ref 6–23)
CALCIUM: 9.5 mg/dL (ref 8.4–10.5)
CO2: 27 mEq/L (ref 19–32)
Chloride: 103 mEq/L (ref 96–112)
Creatinine, Ser: 0.67 mg/dL (ref 0.40–1.20)
GFR: 113.32 mL/min (ref >60.00)
GLUCOSE: 93 mg/dL (ref 70–99)
Potassium: 4.1 mEq/L (ref 3.5–5.1)
SODIUM: 140 meq/L (ref 135–145)

## 2017-11-25 LAB — CBC
HCT: 38.2 % (ref 36.0–46.0)
Hemoglobin: 12.6 g/dL (ref 12.0–15.0)
MCHC: 33 g/dL (ref 30.0–36.0)
MCV: 90.3 fl (ref 78.0–100.0)
PLATELETS: 238 10*3/uL (ref 150.0–400.0)
RBC: 4.23 Mil/uL (ref 3.87–5.11)
RDW: 13.3 % (ref 11.5–15.5)
WBC: 5.2 10*3/uL (ref 4.0–10.5)

## 2017-11-25 LAB — LIPID PANEL
CHOLESTEROL: 157 mg/dL (ref 0–200)
HDL: 57 mg/dL (ref >39.00)
LDL CALC: 80 mg/dL (ref 0–99)
NonHDL: 99.69
TRIGLYCERIDES: 97 mg/dL (ref 0.0–149.0)
Total CHOL/HDL Ratio: 3
VLDL: 19.4 mg/dL (ref 0.0–40.0)

## 2017-11-25 LAB — HEMOGLOBIN A1C: HEMOGLOBIN A1C: 6.1 % (ref 4.6–6.5)

## 2017-11-25 NOTE — Addendum Note (Signed)
Addended by: Agnes Lawrence on: 11/25/2017 10:36 AM   Modules accepted: Orders

## 2017-11-25 NOTE — Telephone Encounter (Signed)
-----   Message from Lucretia Kern, DO sent at 11/14/2017  9:24 PM EST ----- Please check on the status of these overdue results. Schedule if needed.Thanks.   ----- Message ----- From: SYSTEM Sent: 11/13/2017  12:05 AM To: Lucretia Kern, DO

## 2017-11-25 NOTE — Patient Instructions (Addendum)
BEFORE YOU LEAVE: -Prevnar 13 and flu shot -Labs -Annual wellness visit questionnaire -follow up: 3-4 months   Krista Armstrong , Thank you for taking time to come for your Medicare Wellness Visit. I appreciate your ongoing commitment to your health goals. Please review the following plan we discussed and let me know if I can assist you in the future.   These are the goals we discussed: Goals    Update vaccines Labs      This is a list of the screening recommended for you and due dates:  Health Maintenance  Topic Date Due  . HIV Screening  Deferred on prior  . Colon Cancer Screening  03/03/2018  . Pneumonia vaccines (2 of 2 - PPSV23) 11/25/2018  . Mammogram  04/24/2019  . DEXA scan (bone density measurement)  04/24/2019  . Pap Smear  01/09/2020  . Tetanus Vaccine  08/18/2022  . Flu Shot  Completed  .  Hepatitis C: One time screening is recommended by Center for Disease Control  (CDC) for  adults born from [birth year] through 1964.   Completed  *Topic was postponed. The date shown is not the original due date.    Health Maintenance for Postmenopausal Women Menopause is a normal process in which your reproductive ability comes to an end. This process happens gradually over a span of months to years, usually between the ages of 38 and 72. Menopause is complete when you have missed 12 consecutive menstrual periods. It is important to talk with your health care provider about some of the most common conditions that affect postmenopausal women, such as heart disease, cancer, and bone loss (osteoporosis). Adopting a healthy lifestyle and getting preventive care can help to promote your health and wellness. Those actions can also lower your chances of developing some of these common conditions. What should I know about menopause? During menopause, you may experience a number of symptoms, such as:  Moderate-to-severe hot flashes.  Night sweats.  Decrease in sex drive.  Mood  swings.  Headaches.  Tiredness.  Irritability.  Memory problems.  Insomnia.  Choosing to treat or not to treat menopausal changes is an individual decision that you make with your health care provider. What should I know about hormone replacement therapy and supplements? Hormone therapy products are effective for treating symptoms that are associated with menopause, such as hot flashes and night sweats. Hormone replacement carries certain risks, especially as you become older. If you are thinking about using estrogen or estrogen with progestin treatments, discuss the benefits and risks with your health care provider. What should I know about heart disease and stroke? Heart disease, heart attack, and stroke become more likely as you age. This may be due, in part, to the hormonal changes that your body experiences during menopause. These can affect how your body processes dietary fats, triglycerides, and cholesterol. Heart attack and stroke are both medical emergencies. There are many things that you can do to help prevent heart disease and stroke:  Have your blood pressure checked at least every 1-2 years. High blood pressure causes heart disease and increases the risk of stroke.  If you are 18-59 years old, ask your health care provider if you should take aspirin to prevent a heart attack or a stroke.  Do not use any tobacco products, including cigarettes, chewing tobacco, or electronic cigarettes. If you need help quitting, ask your health care provider.  It is important to eat a healthy diet and maintain a healthy weight. ? Be  sure to include plenty of vegetables, fruits, low-fat dairy products, and lean protein. ? Avoid eating foods that are high in solid fats, added sugars, or salt (sodium).  Get regular exercise. This is one of the most important things that you can do for your health. ? Try to exercise for at least 150 minutes each week. The type of exercise that you do should  increase your heart rate and make you sweat. This is known as moderate-intensity exercise. ? Try to do strengthening exercises at least twice each week. Do these in addition to the moderate-intensity exercise.  Know your numbers.Ask your health care provider to check your cholesterol and your blood glucose. Continue to have your blood tested as directed by your health care provider.  What should I know about cancer screening? There are several types of cancer. Take the following steps to reduce your risk and to catch any cancer development as early as possible. Breast Cancer  Practice breast self-awareness. ? This means understanding how your breasts normally appear and feel. ? It also means doing regular breast self-exams. Let your health care provider know about any changes, no matter how small.  If you are 20 or older, have a clinician do a breast exam (clinical breast exam or CBE) every year. Depending on your age, family history, and medical history, it may be recommended that you also have a yearly breast X-ray (mammogram).  If you have a family history of breast cancer, talk with your health care provider about genetic screening.  If you are at high risk for breast cancer, talk with your health care provider about having an MRI and a mammogram every year.  Breast cancer (BRCA) gene test is recommended for women who have family members with BRCA-related cancers. Results of the assessment will determine the need for genetic counseling and BRCA1 and for BRCA2 testing. BRCA-related cancers include these types: ? Breast. This occurs in males or females. ? Ovarian. ? Tubal. This may also be called fallopian tube cancer. ? Cancer of the abdominal or pelvic lining (peritoneal cancer). ? Prostate. ? Pancreatic.  Cervical, Uterine, and Ovarian Cancer Your health care provider may recommend that you be screened regularly for cancer of the pelvic organs. These include your ovaries, uterus,  and vagina. This screening involves a pelvic exam, which includes checking for microscopic changes to the surface of your cervix (Pap test).  For women ages 21-65, health care providers may recommend a pelvic exam and a Pap test every three years. For women ages 12-65, they may recommend the Pap test and pelvic exam, combined with testing for human papilloma virus (HPV), every five years. Some types of HPV increase your risk of cervical cancer. Testing for HPV may also be done on women of any age who have unclear Pap test results.  Other health care providers may not recommend any screening for nonpregnant women who are considered low risk for pelvic cancer and have no symptoms. Ask your health care provider if a screening pelvic exam is right for you.  If you have had past treatment for cervical cancer or a condition that could lead to cancer, you need Pap tests and screening for cancer for at least 20 years after your treatment. If Pap tests have been discontinued for you, your risk factors (such as having a new sexual partner) need to be reassessed to determine if you should start having screenings again. Some women have medical problems that increase the chance of getting cervical cancer. In  these cases, your health care provider may recommend that you have screening and Pap tests more often.  If you have a family history of uterine cancer or ovarian cancer, talk with your health care provider about genetic screening.  If you have vaginal bleeding after reaching menopause, tell your health care provider.  There are currently no reliable tests available to screen for ovarian cancer.  Lung Cancer Lung cancer screening is recommended for adults 34-49 years old who are at high risk for lung cancer because of a history of smoking. A yearly low-dose CT scan of the lungs is recommended if you:  Currently smoke.  Have a history of at least 30 pack-years of smoking and you currently smoke or have quit  within the past 15 years. A pack-year is smoking an average of one pack of cigarettes per day for one year.  Yearly screening should:  Continue until it has been 15 years since you quit.  Stop if you develop a health problem that would prevent you from having lung cancer treatment.  Colorectal Cancer  This type of cancer can be detected and can often be prevented.  Routine colorectal cancer screening usually begins at age 89 and continues through age 30.  If you have risk factors for colon cancer, your health care provider may recommend that you be screened at an earlier age.  If you have a family history of colorectal cancer, talk with your health care provider about genetic screening.  Your health care provider may also recommend using home test kits to check for hidden blood in your stool.  A small camera at the end of a tube can be used to examine your colon directly (sigmoidoscopy or colonoscopy). This is done to check for the earliest forms of colorectal cancer.  Direct examination of the colon should be repeated every 5-10 years until age 48. However, if early forms of precancerous polyps or small growths are found or if you have a family history or genetic risk for colorectal cancer, you may need to be screened more often.  Skin Cancer  Check your skin from head to toe regularly.  Monitor any moles. Be sure to tell your health care provider: ? About any new moles or changes in moles, especially if there is a change in a mole's shape or color. ? If you have a mole that is larger than the size of a pencil eraser.  If any of your family members has a history of skin cancer, especially at a young age, talk with your health care provider about genetic screening.  Always use sunscreen. Apply sunscreen liberally and repeatedly throughout the day.  Whenever you are outside, protect yourself by wearing long sleeves, pants, a wide-brimmed hat, and sunglasses.  What should I know  about osteoporosis? Osteoporosis is a condition in which bone destruction happens more quickly than new bone creation. After menopause, you may be at an increased risk for osteoporosis. To help prevent osteoporosis or the bone fractures that can happen because of osteoporosis, the following is recommended:  If you are 75-63 years old, get at least 1,000 mg of calcium and at least 600 mg of vitamin D per day.  If you are older than age 22 but younger than age 55, get at least 1,200 mg of calcium and at least 600 mg of vitamin D per day.  If you are older than age 26, get at least 1,200 mg of calcium and at least 800 mg of vitamin D  per day.  Smoking and excessive alcohol intake increase the risk of osteoporosis. Eat foods that are rich in calcium and vitamin D, and do weight-bearing exercises several times each week as directed by your health care provider. What should I know about how menopause affects my mental health? Depression may occur at any age, but it is more common as you become older. Common symptoms of depression include:  Low or sad mood.  Changes in sleep patterns.  Changes in appetite or eating patterns.  Feeling an overall lack of motivation or enjoyment of activities that you previously enjoyed.  Frequent crying spells.  Talk with your health care provider if you think that you are experiencing depression. What should I know about immunizations? It is important that you get and maintain your immunizations. These include:  Tetanus, diphtheria, and pertussis (Tdap) booster vaccine.  Influenza every year before the flu season begins.  Pneumonia vaccine.  Shingles vaccine.  Your health care provider may also recommend other immunizations. This information is not intended to replace advice given to you by your health care provider. Make sure you discuss any questions you have with your health care provider. Document Released: 12/28/2005 Document Revised: 05/25/2016  Document Reviewed: 08/09/2015 Elsevier Interactive Patient Education  2018 [employer].

## 2017-11-25 NOTE — Telephone Encounter (Signed)
Urine culture was ordered in the phone message prior to the appt.  Patient was seen in the office on 12/22 and diagnosed with possible yeast infection.

## 2017-11-27 ENCOUNTER — Other Ambulatory Visit: Payer: Self-pay | Admitting: Family Medicine

## 2017-11-28 ENCOUNTER — Encounter: Payer: 59 | Admitting: Family Medicine

## 2017-12-07 ENCOUNTER — Other Ambulatory Visit: Payer: Self-pay | Admitting: Family Medicine

## 2018-02-11 ENCOUNTER — Encounter: Payer: Self-pay | Admitting: Gastroenterology

## 2018-02-17 ENCOUNTER — Other Ambulatory Visit: Payer: Self-pay | Admitting: Family Medicine

## 2018-02-17 DIAGNOSIS — Z853 Personal history of malignant neoplasm of breast: Secondary | ICD-10-CM

## 2018-04-17 ENCOUNTER — Telehealth: Payer: Self-pay | Admitting: Oncology

## 2018-04-17 NOTE — Telephone Encounter (Signed)
GM out of office, resch.  Moved appt from 6/20 to 8/8.  Called pt; not able to leave message.  Mailed calendar.

## 2018-04-18 ENCOUNTER — Telehealth: Payer: Self-pay | Admitting: Oncology

## 2018-04-24 NOTE — Telephone Encounter (Signed)
Erroneous encounter

## 2018-05-05 ENCOUNTER — Ambulatory Visit: Payer: 59 | Admitting: Family Medicine

## 2018-05-05 ENCOUNTER — Encounter: Payer: Self-pay | Admitting: Family Medicine

## 2018-05-05 VITALS — BP 128/88 | HR 82 | Temp 99.0°F | Wt 163.0 lb

## 2018-05-05 DIAGNOSIS — M533 Sacrococcygeal disorders, not elsewhere classified: Secondary | ICD-10-CM | POA: Diagnosis not present

## 2018-05-05 MED ORDER — CYCLOBENZAPRINE HCL 5 MG PO TABS: 5.0000 mg | ORAL_TABLET | Freq: Three times a day (TID) | ORAL | 1 refills | Status: DC | PRN

## 2018-05-05 MED ORDER — MELOXICAM 7.5 MG PO TABS: 7.5000 mg | ORAL_TABLET | Freq: Every day | ORAL | 0 refills | Status: DC

## 2018-05-05 NOTE — Patient Instructions (Signed)
Tailbone Injury The tailbone (coccyx) is the small bone at the lower end of the spine. A tailbone injury may involve stretched ligaments, bruising, or a broken bone (fracture). Tailbone injuries can be painful, and some may take a long time to heal. What are the causes? This condition is often caused by falling and landing on the tailbone. Other causes include:  Repeated strain or friction from actions such as rowing and bicycling.  Childbirth.  In some cases, the cause may not be known. What increases the risk? This condition is more common in women than in men. What are the signs or symptoms? Symptoms of this condition include:  Pain in the lower back, especially when sitting.  Pain or difficulty when standing up from a sitting position.  Bruising in the tailbone area.  Painful bowel movements.  In women, pain during intercourse.  How is this diagnosed? This condition may be diagnosed based on your symptoms and a physical exam. X-rays may be taken if a fracture is suspected. You may also have other tests, such as a CT scan or MRI. How is this treated? This condition may be treated with medicines to help relieve your pain. Most tailbone injuries heal on their own in 4-6 weeks. However, recovery time may be longer if the injury involves a fracture. Follow these instructions at home:  Take medicines only as directed by your health care provider.  If directed, apply ice to the injured area: ? Put ice in a plastic bag. ? Place a towel between your skin and the bag. ? Leave the ice on for 20 minutes, 2-3 times per day for the first 1-2 days.  Sit on a large, rubber or inflated ring or cushion to ease your pain. Lean forward when you are sitting to help decrease discomfort.  Avoid sitting for long periods of time.  Increase your activity as the pain allows. Perform any exercises that are recommended by your health care provider or physical therapist.  If you have pain during  bowel movements, use stool softeners as directed by your health care provider.  Eat a diet that includes plenty of fiber to help prevent constipation.  Keep all follow-up visits as directed by your health care provider. This is important. How is this prevented? Wear appropriate padding and sports gear when bicycling and rowing. This can help to prevent developing an injury that is caused by repeated strain or friction. Contact a health care provider if:  Your pain becomes worse.  Your bowel movements cause a great deal of discomfort.  You are unable to have a bowel movement.  You have uncontrolled urine loss (urinary incontinence).  You have a fever. This information is not intended to replace advice given to you by your health care provider. Make sure you discuss any questions you have with your health care provider. Document Released: 11/02/2000 Document Revised: 07/05/2016 Document Reviewed: 11/01/2014 Elsevier Interactive Patient Education  2018 [employer] Inc[employer]  

## 2018-05-05 NOTE — Progress Notes (Signed)
Subjective:    Patient ID: Krista Armstrong, female    DOB: 01-30-1951, 67 y.o.   MRN: [id number]  No chief complaint on file.   HPI Patient was seen today for acute injury.  Pt endorses falling down the last 3 steps in her home.  Pt states she fell when her dog abruptly stopped in front of her.  The pt slipped and fell down the remaining steps on her butt.  She did not hit her head or her back.  Pt has been taking advil 600 mg, tylenol with codeine, and sitting on a pillow.  Pt states she is ok once she gets moving, but having a bm or getting up causes pain.  Pt has a h/o back surgery x2 for herniated disc.  She also endorses injuring her tailbone in her early 50s after falling down concrete steps.  Pt upset she can't wear heels at this time. Past Medical History:  Diagnosis Date  . Breast cancer ([facility]) DX 05/03/15---  oncologiist-  dr [name]--- per LOV note -- no recurrence   Stage 1A ( T1b, N0, M0), Grade 1,  ER/PR +,  invasive DCIS left outer quadrant--  05-30-2015  s/p  left partial mastectomy and snl dissection x1/  completed radiation 08-09-2015   . Family history of breast cancer   . GERD (gastroesophageal reflux disease)   . Hiatal hernia   . History of adenomatous polyp of colon    03-04-2015  tubular adenom's  . History of radiation therapy 07-07-2015  to 08-09-2015   left breast 42.72Gy @ 2.67Gy per 16 fractions and boost 10Gy @ 2Gy per 5 fractions  . 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 History of sepsis 07/21/2017   left hydronephrosis due to left ureter stone obstruction  . Hot flashes   . Hypertension   . Left ureteral stone   . Wears glasses     Allergies  Allergen Reactions  . Vicodin [Hydrocodone-Acetaminophen] Other (See Comments)    Massive headache    ROS General: Denies fever, chills, night sweats, changes in weight, changes in appetite HEENT: Denies headaches, ear pain, changes in vision,  rhinorrhea, sore throat CV: Denies CP, palpitations, SOB, orthopnea Pulm: Denies SOB, cough, wheezing GI: Denies abdominal pain, nausea, vomiting, diarrhea, constipation GU: Denies dysuria, hematuria, frequency, vaginal discharge Msk: Denies muscle cramps, joint pains  +pain in tailbone. Neuro: Denies weakness, numbness, tingling Skin: Denies rashes, bruising Psych: Denies depression, anxiety, hallucinations     Objective:    Blood pressure 128/88, pulse 82, temperature 99 F (37.2 C), temperature source Oral, weight 163 lb (73.9 kg), SpO2 97 %.   Gen. Pleasant, well-nourished, in no distress, normal affect   HEENT: [employer]/AT, face symmetric, no scleral icterus, PERRLA, nares patent without drainage Lungs: no accessory muscle use, CTAB, no wheezes or rales Cardiovascular: RRR, no peripheral edema Musculoskeletal: No deformities, no cyanosis or clubbing, normal tone.  No TTP of spine or paraspinal muscles.   Moves slowly when going from sitting to standing or standing to sitting.  Sitting in chair leaning to one side to avoid putting pressure on coccyx. Neuro:  A&Ox3, CN II-XII intact, normal gait wearing flats.   Wt Readings from Last 3 Encounters:  05/05/18 163 lb (73.9 kg)  11/25/17 162 lb 14.4 oz (73.9 kg)  08/16/17 161 lb (73 kg)    Lab Results  Component Value Date   WBC 5.2  11/25/2017   HGB 12.6 11/25/2017   HCT 38.2 11/25/2017   PLT 238.0 11/25/2017   GLUCOSE 93 11/25/2017   CHOL 157 11/25/2017   TRIG 97.0 11/25/2017   HDL 57.00 11/25/2017   LDLCALC 80 11/25/2017   ALT 19 07/21/2017   AST 32 07/21/2017   NA 140 11/25/2017   K 4.1 11/25/2017   CL 103 11/25/2017   CREATININE 0.67 11/25/2017   BUN 18 11/25/2017   CO2 27 11/25/2017   HGBA1C 6.1 11/25/2017    Assessment/Plan:  Coccyx pain  -discussed may take 6 wks for injury to resolve -discussed supportive care -given handout - Plan: cyclobenzaprine (FLEXERIL) 5 MG tablet, meloxicam (MOBIC) 7.5 MG  tablet -pt not to take other NASAIDs or pain relievers if taking mobic.  Advised to take flexeril at night as may make her sleepy -given RTC precautions.  Grier Mitts, MD

## 2018-05-08 ENCOUNTER — Ambulatory Visit: Payer: Commercial Managed Care - HMO | Admitting: Oncology

## 2018-05-08 ENCOUNTER — Other Ambulatory Visit: Payer: Commercial Managed Care - HMO

## 2018-05-08 ENCOUNTER — Telehealth: Payer: Self-pay | Admitting: *Deleted

## 2018-05-08 ENCOUNTER — Ambulatory Visit
Admission: RE | Admit: 2018-05-08 | Discharge: 2018-05-08 | Disposition: A | Discharge status: Home / Self Care | Payer: 59 | Source: Ambulatory Visit | Attending: Family Medicine | Admitting: Family Medicine

## 2018-05-08 DIAGNOSIS — Z853 Personal history of malignant neoplasm of breast: Secondary | ICD-10-CM

## 2018-05-08 HISTORY — DX: Personal history of irradiation: Z92.3

## 2018-05-08 NOTE — Telephone Encounter (Signed)
Copied from [place] [phone]. Topic: General - Other >> May 08, 2018  3:01 PM Mcneil, Ja-Kwan wrote: Reason for CRM: Pt states she needs a doctor's note as she has requested a desk riser so she does not have to sit the entire time while working. Pt requests a return call or note can be e-mailed to [email or web address]  Cb# [phone]

## 2018-05-09 NOTE — Telephone Encounter (Signed)
Okay for letter

## 2018-05-12 ENCOUNTER — Encounter: Payer: Self-pay | Admitting: *Deleted

## 2018-05-12 NOTE — Telephone Encounter (Signed)
Yes, it is for coccyx pain. Patient would like to be called when letter is complete and she will pick up in office.

## 2018-05-12 NOTE — Telephone Encounter (Signed)
Is this for the coccyx pain? I did not see her for this, but can write note that standing desk may be more comfortable for her given her medical condition. Recommend follow up here if pain persists of next 2-4 weeks.

## 2018-05-12 NOTE — Telephone Encounter (Signed)
Letter filed at front desk for pickup.  Pt notified of results/instructions and verbalized understanding.

## 2018-05-12 NOTE — Telephone Encounter (Signed)
Ok to write letter. Thanks. 

## 2018-06-01 ENCOUNTER — Other Ambulatory Visit: Payer: Self-pay | Admitting: Family Medicine

## 2018-06-01 DIAGNOSIS — M533 Sacrococcygeal disorders, not elsewhere classified: Secondary | ICD-10-CM

## 2018-06-17 ENCOUNTER — Encounter: Payer: Self-pay | Admitting: Family Medicine

## 2018-06-17 ENCOUNTER — Ambulatory Visit (INDEPENDENT_AMBULATORY_CARE_PROVIDER_SITE_OTHER): Payer: 59 | Admitting: Family Medicine

## 2018-06-17 ENCOUNTER — Ambulatory Visit: Payer: 59 | Admitting: Family Medicine

## 2018-06-17 VITALS — BP 120/80 | HR 92 | Temp 98.6°F | Ht 67.0 in | Wt 165.5 lb

## 2018-06-17 DIAGNOSIS — N3000 Acute cystitis without hematuria: Secondary | ICD-10-CM | POA: Diagnosis not present

## 2018-06-17 DIAGNOSIS — R300 Dysuria: Secondary | ICD-10-CM | POA: Diagnosis not present

## 2018-06-17 DIAGNOSIS — N898 Other specified noninflammatory disorders of vagina: Secondary | ICD-10-CM | POA: Diagnosis not present

## 2018-06-17 LAB — POCT URINALYSIS DIPSTICK
BILIRUBIN UA: NEGATIVE
Blood, UA: NEGATIVE
Glucose, UA: NEGATIVE
KETONES UA: NEGATIVE
Nitrite, UA: POSITIVE
PROTEIN UA: NEGATIVE
Spec Grav, UA: 1.025 (ref 1.010–1.025)
UROBILINOGEN UA: 0.2 U/dL
pH, UA: 5 (ref 5.0–8.0)

## 2018-06-17 MED ORDER — FLUCONAZOLE 150 MG PO TABS: 150.0000 mg | ORAL_TABLET | Freq: Once | ORAL | 0 refills | Status: AC

## 2018-06-17 MED ORDER — NITROFURANTOIN MONOHYD MACRO 100 MG PO CAPS: 100.0000 mg | ORAL_CAPSULE | Freq: Two times a day (BID) | ORAL | 0 refills | Status: DC

## 2018-06-17 NOTE — Progress Notes (Signed)
HPI:  Using dictation device. Unfortunately this device frequently misinterprets words/phrases.  Acute visit for vulvovaginal pruritus: -Started about 5 days ago -Mild vaginal itching -Mild burning with urination -She tried Monistat over-the-counter without relief -Denies fevers: Significant discharge, fevers, frequency, urgency, flank pain, nausea, vomiting, pelvic pain, abdominal pain   ROS: See pertinent positives and negatives per HPI.  Past Medical History:  Diagnosis Date  . Breast cancer ([place]) DX 05/03/15---  oncologiist-  dr [name]--- per LOV note -- no recurrence   Stage 1A ( T1b, N0, M0), Grade 1,  ER/PR +,  invasive DCIS left outer quadrant--  05-30-2015  s/p  left partial mastectomy and snl dissection x1/  completed radiation 08-09-2015   . Family history of breast cancer   . GERD (gastroesophageal reflux disease)   . Hiatal hernia   . History of adenomatous polyp of colon    03-04-2015  tubular adenom's  . History of radiation therapy 07-07-2015  to 08-09-2015   left breast 42.72Gy @ 2.67Gy per 16 fractions and boost 10Gy @ 2Gy per 5 fractions  . 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 History of sepsis 07/21/2017   left hydronephrosis due to left ureter stone obstruction  . Hot flashes   . Hypertension   . Left ureteral stone   . Personal history of radiation therapy   . Wears glasses     Past Surgical History:  Procedure Laterality Date  . BREAST BIOPSY Left 05/03/2015   U/S Core- Benign  . BREAST BIOPSY Right    U/S Core- Benign  . BREAST LUMPECTOMY    . CHOLECYSTECTOMY OPEN  2005  . COLONOSCOPY WITH ESOPHAGOGASTRODUODENOSCOPY (EGD)  03-04-2015   dr [name]  . CYSTOSCOPY WITH RETROGRADE PYELOGRAM, URETEROSCOPY AND STENT PLACEMENT Left 07/21/2017   Procedure: CYSTOSCOPY WITH RETROGRADE PYELOGRAM, URETEROSCOPY AND STENT PLACEMENT;  Surgeon: [name], MD;  Location: [facility];  Service:  Urology;  Laterality: Left;  . CYSTOSCOPY WITH RETROGRADE PYELOGRAM, URETEROSCOPY AND STENT PLACEMENT Left 08/16/2017   Procedure: CYSTOSCOPY WITH RETROGRADE PYELOGRAM, URETEROSCOPY , LASER LITHOTRIPSY, STONE BASKETRY AND STENT REPLACEMENT;  Surgeon: [name], MD;  Location: [facility];  Service: Urology;  Laterality: Left;  . DILATATION & CURETTAGE/HYSTEROSCOPY WITH MYOSURE N/A 02/05/2017   Procedure: DILATATION & CURETTAGE/HYSTEROSCOPY WITH MYOSURE;  Surgeon: [name], MD;  Location: [facility];  Service: Gynecology;  Laterality: N/A;  . GASTRIC RESECTION  2006   wedge resection for two GIST tumors (benign)  . HOLMIUM LASER APPLICATION Left 0/99/8338   Procedure: HOLMIUM LASER APPLICATION;  Surgeon: [name], MD;  Location: [facility];  Service: Urology;  Laterality: Left;  . LUMBAR LAMINECTOMY  2000;  2009  . RADIOACTIVE SEED GUIDED PARTIAL MASTECTOMY WITH AXILLARY SENTINEL LYMPH NODE BIOPSY Left 05/30/2015   Procedure: LEFT PARTIAL MASTECTOMY WITH RADIOACTIVE SEED LOCALIZATION, LEFT AXILLARY SENTINEL LYMPH NODE BIOPSY;  Surgeon: Fanny Skates, MD;  Location: [facility];  Service: General;  Laterality: Left;  . TRANSTHORACIC ECHOCARDIOGRAM  07/06/2010   normal echo; ef 70%    Family History  Problem Relation Age of Onset  . Breast cancer Mother 57  . Stroke Father   . Heart disease Maternal Grandmother   . Cervical cancer Maternal Aunt   . Colon cancer Neg Hx   . Colon polyps Neg Hx   . Diabetes Neg Hx   .  Kidney disease Neg Hx   . Esophageal cancer Neg Hx   . Gallbladder disease Neg Hx     SOCIAL HX: See HPI    Current Outpatient Medications:  .  amLODipine (NORVASC) 5 MG tablet, TAKE 1 TABLET (5 MG TOTAL) BY MOUTH DAILY., Disp: 90 tablet, Rfl: 2 .  esomeprazole (NEXIUM) 40 MG capsule, Take 1 capsule (40 mg total) by mouth daily., Disp: 90 capsule, Rfl: 1 .  gabapentin (NEURONTIN) 300  MG capsule, Take 2 capsules (600 mg total) by mouth at bedtime., Disp: 180 capsule, Rfl: 3 .  losartan (COZAAR) 50 MG tablet, TAKE 1 TABLET (50 MG TOTAL) BY MOUTH DAILY. *MAX DAY SUPPLY PER INSURANCE*, Disp: 90 tablet, Rfl: 3 .  tamoxifen (NOLVADEX) 20 MG tablet, Take 1 tablet (20 mg total) by mouth daily., Disp: 90 tablet, Rfl: 3 .  fluconazole (DIFLUCAN) 150 MG tablet, Take 1 tablet (150 mg total) by mouth once for 1 dose. Repeat in 3 days if needed., Disp: 2 tablet, Rfl: 0 .  nitrofurantoin, macrocrystal-monohydrate, (MACROBID) 100 MG capsule, Take 1 capsule (100 mg total) by mouth 2 (two) times daily., Disp: 14 capsule, Rfl: 0  EXAM:  Vitals:   06/17/18 1558  BP: 120/80  Pulse: 92  Temp: 98.6 F (37 C)    Body mass index is 25.92 kg/m.  GENERAL: vitals reviewed and listed above, alert, oriented, appears well hydrated and in no acute distress  HEENT: atraumatic, conjunttiva clear, no obvious abnormalities on inspection of external nose and ears  NECK: no obvious masses on inspection  CV: HRRR  MS: moves all extremities without noticeable abnormality  OV:FIEPPIRJ today  PSYCH: pleasant and cooperative, no obvious depression or anxiety  ASSESSMENT AND PLAN:  Discussed the following assessment and plan:  Vaginal pruritus  Dysuria - Plan: POC Urinalysis Dipstick  Acute cystitis without hematuria  -udip c/w likely UTI - opted to treat with macrobid after discussion -she declined pelvic exam today - feels is yeast and responded well to diflucan in the past per her report - wants to try this first, rx sent -advised to follow up for pelvic if any worsening or symptoms persist in 1 week -Patient advised to return or notify a doctor immediately if symptoms worsen or persist or new concerns arise.  Patient Instructions  I hope you are feeling better soon! Follow up if any symptoms remain in 1 week. Sooner if worsening or other concerns.  Call your gastroenterology office  about your colon cancer screening.   Lucretia Kern, DO

## 2018-06-17 NOTE — Patient Instructions (Addendum)
I hope you are feeling better soon! Follow up if any symptoms remain in 1 week. Sooner if worsening or other concerns.  Call your gastroenterology office about your colon cancer screening.

## 2018-06-19 ENCOUNTER — Encounter: Payer: Self-pay | Admitting: Gastroenterology

## 2018-06-23 NOTE — Progress Notes (Signed)
[facility]  [phone] Fax:([phone]) [phone]     ID: Verlon Au DOB: [birth year]  MR#: [id number]  JYN#:[id number]  Patient Care Team: Lucretia Kern, DO as PCP - General (Family Medicine) Fanny Skates, MD as Consulting Physician (General Surgery) Magrinat, Virgie Dad, MD as Consulting Physician (Oncology) Thea Silversmith, MD as Consulting Physician (Radiation Oncology) Rockwell Germany, RN as Registered Nurse Mauro Kaufmann, RN as Registered Nurse Holley Bouche, NP as Nurse Practitioner (Nurse Practitioner) Sylvan Cheese, NP as Nurse Practitioner (Nurse Practitioner) Berle Mull, MD as Consulting Physician (Sports Medicine) McKenzie, Candee Furbish, MD as Consulting Physician (Urology) PCP: Lucretia Kern, DO OTHER MD: Owens Loffler MD  CHIEF COMPLAINT: estrogen receptor positive breast cancer  CURRENT TREATMENT: tamoxifen  BREAST CANCER HISTORY: From the original intake note:  Barbarann (pronounced "ROZ-mah") had routine screening mammography at the [facility] Center 04/12/2015, showing a possible asymmetry in the left breast. Bilateral diagnostic mammography with tomosynthesis the left breast ultrasonography 04/26/2015, showed the breast density to be category B. In the left breast there was a persistent area of focal asymmetry in the lower outer quadrant. On physical exam there was a 1 cm firm nodular area at 3:30 o'clock 7 cm from the nipple. Ultrasound confirmed an irregular hypoechoic mass measuring 7 mm corresponding to the palpable abnormality. There were no abnormal appearing axillary lymph nodes.  Biopsy of the left breast mass in question 05/03/2015 showed ([id number]) and invasive ductal carcinoma, grade 1, estrogen receptor 95% positive, progesterone receptor 1% positive, both with strong staining intensity, with an MIB-1 of 5%, and no HER-2 amplification, the signals ratio being 1.43 and the number per cell 2.50.  The patient's  subsequent history is as detailed below.  INTERVAL HISTORY: Preslyn returns today for follow-up and treatment of her estrogen receptor positive breast cancer. She continues on tamoxifen, with good tolerance. She has rare hot flashes and has mild increase in vaginal discharge.  Despite the discharge she has dyspareunia problems.  She notes that the gabapentin aids with her nighttime hot flashes.   Since her last visit to the office, she underwent a bilateral diagnostic mammogram with TOMO at [facility] on 05/08/2018 showed: Breast density category B. No evidence of malignancy.  REVIEW OF SYSTEMS: Yareliz reports that she is moving to [place] to be closer to her husband's family--they are leaving on November 07, 2018.  She denies unusual headaches, visual changes, nausea, vomiting, or dizziness. There has been no unusual cough, phlegm production, or pleurisy. This been no change in bowel or bladder habits. She denies unexplained fatigue or unexplained weight loss, bleeding, rash, or fever. A detailed review of systems was otherwise stable.    PAST MEDICAL HISTORY: Past Medical History:  Diagnosis Date  . Breast cancer ([place]) DX 05/03/15---  oncologiist-  dr [name]--- per LOV note -- no recurrence   Stage 1A ( T1b, N0, M0), Grade 1,  ER/PR +,  invasive DCIS left outer quadrant--  05-30-2015  s/p  left partial mastectomy and snl dissection x1/  completed radiation 08-09-2015   . Family history of breast cancer   . GERD (gastroesophageal reflux disease)   . Hiatal hernia   . History of adenomatous polyp of colon    03-04-2015  tubular adenom's  . History of radiation therapy 07-07-2015  to 08-09-2015   left breast 42.72Gy @ 2.67Gy per 16 fractions and boost 10Gy @ 2Gy per 5 fractions  . 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 History of sepsis 07/21/2017   left hydronephrosis due to left ureter stone obstruction  . Hot  flashes   . Hypertension   . Left ureteral stone   . Personal history of radiation therapy   . Wears glasses     PAST SURGICAL HISTORY: Past Surgical History:  Procedure Laterality Date  . BREAST BIOPSY Left 05/03/2015   U/S Core- Benign  . BREAST BIOPSY Right    U/S Core- Benign  . BREAST LUMPECTOMY    . CHOLECYSTECTOMY OPEN  2005  . COLONOSCOPY WITH ESOPHAGOGASTRODUODENOSCOPY (EGD)  03-04-2015   dr [name]  . CYSTOSCOPY WITH RETROGRADE PYELOGRAM, URETEROSCOPY AND STENT PLACEMENT Left 07/21/2017   Procedure: CYSTOSCOPY WITH RETROGRADE PYELOGRAM, URETEROSCOPY AND STENT PLACEMENT;  Surgeon: [name], MD;  [facility];  Service: Urology;  Laterality: Left;  . CYSTOSCOPY WITH RETROGRADE PYELOGRAM, URETEROSCOPY AND STENT PLACEMENT Left 08/16/2017   Procedure: CYSTOSCOPY WITH RETROGRADE PYELOGRAM, URETEROSCOPY , LASER LITHOTRIPSY, STONE BASKETRY AND STENT REPLACEMENT;  Surgeon: [name], MD;  Location: [facility];  Service: Urology;  Laterality: Left;  . DILATATION & CURETTAGE/HYSTEROSCOPY WITH MYOSURE N/A 02/05/2017   Procedure: DILATATION & CURETTAGE/HYSTEROSCOPY WITH MYOSURE;  Surgeon: [name], MD;  Location: [facility];  Service: Gynecology;  Laterality: N/A;  . GASTRIC RESECTION  2006   wedge resection for two GIST tumors (benign)  . HOLMIUM LASER APPLICATION Left 2/84/1324   Procedure: HOLMIUM LASER APPLICATION;  Surgeon: [name], MD;  Location: [facility];  Service: Urology;  Laterality: Left;  . LUMBAR LAMINECTOMY  2000;  2009  . RADIOACTIVE SEED GUIDED PARTIAL MASTECTOMY WITH AXILLARY SENTINEL LYMPH NODE BIOPSY Left 05/30/2015   Procedure: LEFT PARTIAL MASTECTOMY WITH RADIOACTIVE SEED LOCALIZATION, LEFT AXILLARY SENTINEL LYMPH NODE BIOPSY;  Surgeon: Fanny Skates, MD;  Location: [facility];  Service: General;  Laterality: Left;  . TRANSTHORACIC ECHOCARDIOGRAM  07/06/2010    normal echo; ef 70%    FAMILY HISTORY Family History  Problem Relation Age of Onset  . Breast cancer Mother 55  . Stroke Father   . Heart disease Maternal Grandmother   . Cervical cancer Maternal Aunt   . Colon cancer Neg Hx   . Colon polyps Neg Hx   . Diabetes Neg Hx   . Kidney disease Neg Hx   . Esophageal cancer Neg Hx   . Gallbladder disease Neg Hx   the patient's father died at the age of 65 from a ruptured aortic aneurysm. The patient's mother was diagnosed with breast cancer at the age of 56 and died within a year from that problem. The patient had no brothers, one sister. There is no other history of breast or ovarian cancer in the family.  GYNECOLOGIC HISTORY:  No LMP recorded. Patient is postmenopausal. Menarche age 52, first live birth age 19. The patient is GX P3. She stopped having periods in the late 1990s. She did not take hormone replacement. She did take oral contraceptives remotely for approximately 10 years with no complications.  SOCIAL HISTORY: (Updated June 2018). 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. She Has remarried, her husband is a Administrator and he is gone 2 or 3 weeks out of every month. At home she lives with her daughter [name], who  works as an Web designer out of her home. The patient's 2 other children are [name],  who lives in [place] and works as a Futures trader, who lives in [place] and works as an Web designer for an [employer]. The patient has 4 grandchildren. She attends [employer].     ADVANCED DIRECTIVES:   HEALTH MAINTENANCE: Social History   Tobacco Use  . Smoking status: Never Smoker  . Smokeless tobacco: Never Used  Substance Use Topics  . Alcohol use: Yes    Alcohol/week: 7.0 standard drinks    Types: 7 Glasses of wine per week    Comment: wine every  night   . Drug use: No     Colonoscopy:03/16/2015  PAP:  Bone  density:never  Lipid panel:  Allergies  Allergen Reactions  . Vicodin [Hydrocodone-Acetaminophen] Other (See Comments)    Massive headache    Current Outpatient Medications  Medication Sig Dispense Refill  . amLODipine (NORVASC) 5 MG tablet TAKE 1 TABLET (5 MG TOTAL) BY MOUTH DAILY. 90 tablet 2  . esomeprazole (NEXIUM) 40 MG capsule Take 1 capsule (40 mg total) by mouth daily. 90 capsule 1  . estradiol (ESTRING) 2 MG vaginal ring Place 2 mg vaginally every 3 (three) months. follow package directions 2 each 12  . gabapentin (NEURONTIN) 300 MG capsule Take 2 capsules (600 mg total) by mouth at bedtime. 90 capsule 4  . losartan (COZAAR) 50 MG tablet TAKE 1 TABLET (50 MG TOTAL) BY MOUTH DAILY. *MAX DAY SUPPLY PER INSURANCE* 90 tablet 3  . tamoxifen (NOLVADEX) 20 MG tablet Take 1 tablet (20 mg total) by mouth daily. 90 tablet 3   No current facility-administered medications for this visit.     OBJECTIVE: middle-aged African-American woman in no acute distress  Vitals:   06/26/18 1442  BP: 134/86  Pulse: 84  Resp: 19  Temp: 98.7 F (37.1 C)  SpO2: 100%     Body mass index is 25.78 kg/m.    ECOG FS:0 - Asymptomatic  Sclerae unicteric, pupils round and equal Oropharynx clear and moist No cervical or supraclavicular adenopathy Lungs no rales or rhonchi Heart regular rate and rhythm Abd soft, nontender, positive bowel sounds MSK no focal spinal tenderness, no upper extremity lymphedema Neuro: nonfocal, well oriented, appropriate affect Breasts: The right breast is status post lumpectomy and radiation.  There is no evidence of local recurrence.  The left breast is benign.  Both axillae are benign.  LAB RESULTS:  CMP     Component Value Date/Time   NA 140 11/25/2017 1039   NA 143 05/09/2017 1454   K 4.1 11/25/2017 1039   K 3.6 05/09/2017 1454   CL 103 11/25/2017 1039   CO2 27 11/25/2017 1039   CO2 27 05/09/2017 1454   GLUCOSE 93 11/25/2017 1039   GLUCOSE 93 05/09/2017  1454   BUN 18 11/25/2017 1039   BUN 13.5 05/09/2017 1454   CREATININE 0.67 11/25/2017 1039   CREATININE 0.7 05/09/2017 1454   CALCIUM 9.5 11/25/2017 1039   CALCIUM 9.4 05/09/2017 1454   PROT 7.1 07/21/2017 1313   PROT 6.9 05/09/2017 1454   ALBUMIN 3.8 07/21/2017 1313   ALBUMIN 3.6 05/09/2017 1454   AST 32 07/21/2017 1313   AST 11 05/09/2017 1454   ALT 19 07/21/2017 1313   ALT 11 05/09/2017 1454   ALKPHOS 28 (L) 07/21/2017 1313   ALKPHOS 37 (L) 05/09/2017 1454   BILITOT 0.7 07/21/2017 1313   BILITOT 0.31 05/09/2017 1454   GFRNONAA >60 07/24/2017 0355  GFRAA >60 07/24/2017 0355    INo results found for: SPEP, UPEP  Lab Results  Component Value Date   WBC 6.8 06/26/2018   NEUTROABS 3.7 06/26/2018   HGB 11.6 06/26/2018   HCT 35.6 06/26/2018   MCV 89.9 06/26/2018   PLT 233 06/26/2018      Chemistry      Component Value Date/Time   NA 140 11/25/2017 1039   NA 143 05/09/2017 1454   K 4.1 11/25/2017 1039   K 3.6 05/09/2017 1454   CL 103 11/25/2017 1039   CO2 27 11/25/2017 1039   CO2 27 05/09/2017 1454   BUN 18 11/25/2017 1039   BUN 13.5 05/09/2017 1454   CREATININE 0.67 11/25/2017 1039   CREATININE 0.7 05/09/2017 1454      Component Value Date/Time   CALCIUM 9.5 11/25/2017 1039   CALCIUM 9.4 05/09/2017 1454   ALKPHOS 28 (L) 07/21/2017 1313   ALKPHOS 37 (L) 05/09/2017 1454   AST 32 07/21/2017 1313   AST 11 05/09/2017 1454   ALT 19 07/21/2017 1313   ALT 11 05/09/2017 1454   BILITOT 0.7 07/21/2017 1313   BILITOT 0.31 05/09/2017 1454      No results found for: LABCA2  No components found for: LABCA125  No results for input(s): INR in the last 168 hours.  Urinalysis    Component Value Date/Time   COLORURINE YELLOW 07/21/2017 1320   APPEARANCEUR HAZY (A) 07/21/2017 1320   LABSPEC 1.016 07/21/2017 1320   PHURINE 5.0 07/21/2017 1320   GLUCOSEU NEGATIVE 07/21/2017 1320   HGBUR MODERATE (A) 07/21/2017 1320   BILIRUBINUR negative 06/17/2018 1613    KETONESUR 5 (A) 07/21/2017 1320   PROTEINUR Negative 06/17/2018 1613   PROTEINUR NEGATIVE 07/21/2017 1320   UROBILINOGEN 0.2 06/17/2018 1613   NITRITE positive 06/17/2018 1613   NITRITE POSITIVE (A) 07/21/2017 1320   LEUKOCYTESUR Moderate (2+) (A) 06/17/2018 1613    STUDIES: Bilateral diagnostic mammogram with TOMO at [facility] on 05/08/2018 showed: Breast density category B. No evidence of malignancy  ASSESSMENT: 67 y.o. [place] woman status post left breast lower outer quadrant biopsy 05/03/2015 for a clinical T1b N0, stage IA invasive ductal carcinoma, grade 1, estrogen receptor strongly positive, progesterone receptor minimally positive, with an MIB-1 of 5% and no HER-2 amplification.  (1) left lumpectomy and sentinel lymph node sampling 05/30/2015 showed a pT1b pN0, stage IA invasive ductal carcinoma, grade 1, with close but negative margins, and repeat HER-2 again negative  (2) Oncotype score of 20 (intermediate risk) predicts a 10 year risk of recurrence outside the breast of 13% if the patient's only systemic therapy is tamoxifen for 5 years.   (a) the patient opted against chemotherapy given the very marginal benefits in her situation  (3) adjuvant radiation 07/07/2015-08/09/2015:  Left breast/ 42.72 Gy at 2.67 Gy per fraction x 16 fractions.  Left breast boost/ 10 Gy at 2 Gy per fraction x 5 fractions  (4) tamoxifen started 11/20/2015  (5) genetics testing 09/05/2015 through the [facility] gene panel performed by [facility] offers found no deleterious mutations in BRCA1 c.68_69delAG (also known as 185delAG or 187delAGE), BRCA1 c.5266dupC (also known as 5382insC or 5385insC) and BRCA2 c.5946delT (also known as 6174delT).   PLAN: Geroldine is now just about 3 years out from definitive surgery for her breast cancer with no evidence of disease recurrence.  This is very favorable.  She is tolerating tamoxifen well and the plan will be to continue that a minimum of 5  years.  She is having some vaginal dryness issues.  She understands that while on tamoxifen I think it is okay for her to use some vaginal estrogens.  We discussed several options.  She is going to give Estring a try.  I gave her the appropriate prescriptions.  I also referred her to our pelvic health program.  I also refilled her tamoxifen and gabapentin since she is planning to move to [facility] in December  We will be glad to copy her records before she goes--she did not want to do it today because she thought she would lose them between now and then--and also I gave her my card so that she can get her local oncologist in [place] to give a call to our office and we can fax any records necessary at that time  I will be glad to see her at any point in the future if and when the need arises but as of now are making no further routine appointments for her here  Chauncey Cruel, MD  06/26/18 3:16 PM Medical Oncology and Hematology [facility] [place],  [address] Tel. [phone]    Fax. [phone]    I, Soijett Blue am acting as scribe for Dr. Sarajane Jews C. Magrinat.  I, Lurline Del MD, have reviewed the above documentation for accuracy and completeness, and I agree with the above.

## 2018-06-25 ENCOUNTER — Other Ambulatory Visit: Payer: Self-pay

## 2018-06-25 DIAGNOSIS — Z170 Estrogen receptor positive status [ER+]: Principal | ICD-10-CM

## 2018-06-25 DIAGNOSIS — C50512 Malignant neoplasm of lower-outer quadrant of left female breast: Secondary | ICD-10-CM

## 2018-06-26 ENCOUNTER — Inpatient Hospital Stay: Payer: 59

## 2018-06-26 ENCOUNTER — Inpatient Hospital Stay: Payer: 59 | Attending: Oncology | Admitting: Oncology

## 2018-06-26 VITALS — BP 134/86 | HR 84 | Temp 98.7°F | Resp 19 | Ht 67.0 in | Wt 164.6 lb

## 2018-06-26 DIAGNOSIS — N951 Menopausal and female climacteric states: Secondary | ICD-10-CM | POA: Diagnosis not present

## 2018-06-26 DIAGNOSIS — Z170 Estrogen receptor positive status [ER+]: Principal | ICD-10-CM

## 2018-06-26 DIAGNOSIS — C50512 Malignant neoplasm of lower-outer quadrant of left female breast: Secondary | ICD-10-CM | POA: Diagnosis not present

## 2018-06-26 DIAGNOSIS — N898 Other specified noninflammatory disorders of vagina: Secondary | ICD-10-CM | POA: Insufficient documentation

## 2018-06-26 LAB — CBC WITH DIFFERENTIAL (CANCER CENTER ONLY)
Basophils Absolute: 0 10*3/uL (ref 0.0–0.1)
Basophils Relative: 1 %
EOS ABS: 0.1 10*3/uL (ref 0.0–0.5)
Eosinophils Relative: 2 %
HEMATOCRIT: 35.6 % (ref 34.8–46.6)
HEMOGLOBIN: 11.6 g/dL (ref 11.6–15.9)
LYMPHS ABS: 2.6 10*3/uL (ref 0.9–3.3)
Lymphocytes Relative: 38 %
MCH: 29.3 pg (ref 25.1–34.0)
MCHC: 32.6 g/dL (ref 31.5–36.0)
MCV: 89.9 fL (ref 79.5–101.0)
MONOS PCT: 6 %
Monocytes Absolute: 0.4 10*3/uL (ref 0.1–0.9)
NEUTROS ABS: 3.7 10*3/uL (ref 1.5–6.5)
NEUTROS PCT: 53 %
Platelet Count: 233 10*3/uL (ref 145–400)
RBC: 3.96 MIL/uL (ref 3.70–5.45)
RDW: 13.4 % (ref 11.2–14.5)
WBC: 6.8 10*3/uL (ref 3.9–10.3)

## 2018-06-26 LAB — CMP (CANCER CENTER ONLY)
ALK PHOS: 46 U/L (ref 38–126)
ALT: 18 U/L (ref 0–44)
ANION GAP: 10 (ref 5–15)
AST: 22 U/L (ref 15–41)
Albumin: 4.1 g/dL (ref 3.5–5.0)
BUN: 12 mg/dL (ref 8–23)
CO2: 25 mmol/L (ref 22–32)
CREATININE: 0.77 mg/dL (ref 0.44–1.00)
Calcium: 9 mg/dL (ref 8.9–10.3)
Chloride: 104 mmol/L (ref 98–111)
Glucose, Bld: 93 mg/dL (ref 70–99)
Potassium: 4.1 mmol/L (ref 3.5–5.1)
SODIUM: 139 mmol/L (ref 135–145)
TOTAL PROTEIN: 7.6 g/dL (ref 6.5–8.1)
Total Bilirubin: 0.4 mg/dL (ref 0.3–1.2)

## 2018-06-26 MED ORDER — GABAPENTIN 300 MG PO CAPS: 600.0000 mg | ORAL_CAPSULE | Freq: Every day | ORAL | 3 refills | Status: DC

## 2018-06-26 MED ORDER — GABAPENTIN 300 MG PO CAPS: 600.0000 mg | ORAL_CAPSULE | Freq: Every day | ORAL | 4 refills | Status: DC

## 2018-06-26 MED ORDER — ESTRADIOL 2 MG VA RING: 2.0000 mg | VAGINAL_RING | VAGINAL | 12 refills | Status: DC

## 2018-06-26 MED ORDER — TAMOXIFEN CITRATE 20 MG PO TABS: 20.0000 mg | ORAL_TABLET | Freq: Every day | ORAL | 3 refills | Status: DC

## 2018-06-27 ENCOUNTER — Other Ambulatory Visit: Payer: Self-pay | Admitting: Family Medicine

## 2018-06-27 ENCOUNTER — Telehealth: Payer: Self-pay | Admitting: Oncology

## 2018-06-27 NOTE — Telephone Encounter (Signed)
No 8/8 orders or referrals.

## 2018-07-04 ENCOUNTER — Telehealth: Payer: Self-pay | Admitting: Oncology

## 2018-07-04 ENCOUNTER — Other Ambulatory Visit: Payer: Self-pay | Admitting: Oncology

## 2018-07-04 DIAGNOSIS — Z170 Estrogen receptor positive status [ER+]: Principal | ICD-10-CM

## 2018-07-04 DIAGNOSIS — C50512 Malignant neoplasm of lower-outer quadrant of left female breast: Secondary | ICD-10-CM

## 2018-07-04 NOTE — Telephone Encounter (Signed)
Per 8/8 schedule message referral to physical therapy at [facility]. Message to [facility] re entering referral. Once referral entered office will contact patient.

## 2018-07-04 NOTE — Telephone Encounter (Signed)
Referral entered. Office will call patient.

## 2018-07-11 ENCOUNTER — Ambulatory Visit: Payer: Medicare Other | Admitting: Hematology

## 2018-07-16 ENCOUNTER — Ambulatory Visit: Payer: 59 | Attending: Oncology | Admitting: Physical Therapy

## 2018-07-16 ENCOUNTER — Encounter: Payer: Self-pay | Admitting: Physical Therapy

## 2018-07-16 ENCOUNTER — Other Ambulatory Visit: Payer: Self-pay

## 2018-07-16 DIAGNOSIS — R278 Other lack of coordination: Secondary | ICD-10-CM | POA: Insufficient documentation

## 2018-07-16 DIAGNOSIS — M6281 Muscle weakness (generalized): Secondary | ICD-10-CM | POA: Diagnosis not present

## 2018-07-16 NOTE — Patient Instructions (Addendum)
Adduction: Hip - Knees Together With Pelvic Floor (Hook-Lying)    Lie with hips and knees bent, towel roll between knees. Squeeze pelvic floor while pushing knees together. Hold for __5_ seconds. Rest for _5__ seconds. Repeat __10_ times. Do _2__ times a day.   Copyright  VHI. All rights reserved.  Moisturizers . They are used in the vagina to hydrate the mucous membrane that make up the vaginal canal. . Designed to keep a more normal acid balance (ph) . Once placed in the vagina, it will last between two to three days.  . Use 2-3 times per week at bedtime and last longer than 60 min. . Ingredients to avoid is glycerin and fragrance, can increase chance of infection . Should not be used just before sex due to causing irritation . Most are gels administered either in a tampon-shaped applicator or as a vaginal suppository. They are non-hormonal.   Types of Moisturizers . Samul Dada- drug store . Vitamin E vaginal suppositories- Whole foods, Amazon . Moist Again . Coconut oil- can break down condoms . Julva- (Do no use if on Tamoxifen) amazon . Yes moisturizer- amazon . NeuEve Silk , NeuEve Silver for menopausal or over 65 (if have severe vaginal atrophy or cancer treatments use NeuEve Silk for  1 month than move to The Pepsi)- [employer], MapleFlower.dk . Olive and Bee intimate cream- www.oliveandbee.com.au . Mae vaginal moisturizer- Amazon  Creams to use externally on the Vulva area  Albertson's (good for for cancer patients that had radiation to the area)- [place] or Danaher Corporation.FlyingBasics.com.br  V-magic cream - amazon  Julva-amazon  Vital "V [place] Yam salve ( help moisturize and help with thinning vulvar area, does have [place] by Damiva labial moisturizer ([place],    Things to avoid in the vaginal area . Do not use things to irritate the vulvar area . No lotions just specialized creams for the  vulva area- Neogyn, V-magic, No soaps; can use Aveeno or Calendula cleanser if needed. Must be gentle . No deodorants . No douches . Good to sleep without underwear to let the vaginal area to air out . No scrubbing: spread the lips to let warm water rinse over labias and pat dry   Lubrication . Used for intercourse to reduce friction . Avoid ones that have glycerin, warming gels, tingling gels, icing or cooling gel, scented . Avoid parabens due to a preservative similar to female sex hormone . May need to be reapplied once or several times during sexual activity . Can be applied to both partners genitals prior to vaginal penetration to minimize friction or irritation . Prevent irritation and mucosal tears that cause post coital pain and increased the risk of vaginal and urinary tract infections . Oil-based lubricants cannot be used with condoms due to breaking them down.  Least likely to irritate vaginal tissue.  . Plant based-lubes are safe . Silicone-based lubrication are thicker and last long and used for post-menopausal women  Vaginal Lubricators Here is a list of some suggested lubricators you can use for intercourse. Use the most hypoallergenic product.  You can place on you or your partner.   Slippery Stuff  Sylk or Sliquid Natural H2O ( good  if frequent UTI's)  Blossom Organics (www.blossom-organics.com)  Luvena   Coconut oil  PJur Woman Nude- water based lubricant, amazon  Uberlube- Amazon  Aloe Vera  Yes lubricant- [employer] Platinum-Silicone, SLM Corporation, Eaton Corporation  Olive and Bee intimate cream-  www.oliveandbee.com.au Things to avoid in lubricants are glycerin, warming gels, tingling gels, icing or cooling  gels, and scented gels.  Also avoid Vaseline. KY jelly, Replens, and Astroglide kills good bacteria(lactobacilli)  Things to avoid in the vaginal area . Do not use things to irritate the vulvar area . No lotions- see below . No soaps; can use Aveeno or  Calendula cleanser if needed. Must be gentle . No deodorants . No douches . Good to sleep without underwear to let the vaginal area to air out . No scrubbing: spread the lips to let warm water rinse over labias and pat dry  Creams that can be used on the [place] Releveum or [facility] [address] [place], [place] [address] Phone # [phone] Fax [phone]

## 2018-07-16 NOTE — Therapy (Signed)
[facility] [address], [place], [place], [address] Phone: [phone]   Fax:  [phone]  Physical Therapy Evaluation  Patient Details  Name: Krista Armstrong MRN: [id number] Date of Birth: 02-Mar-1951 No data recorded  Encounter Date: 07/16/2018  PT End of Session - 07/16/18 1627    Visit Number  1    Date for PT Re-Evaluation  09/10/18    PT Start Time  1615    PT Stop Time  1655    PT Time Calculation (min)  40 min    Activity Tolerance  Patient tolerated treatment well    Behavior During Therapy  [facility] for tasks assessed/performed       Past Medical History:  Diagnosis Date  . Breast cancer (Portal) DX 05/03/15---  oncologiist-  dr [name]--- per LOV note -- no recurrence   Stage 1A ( T1b, N0, M0), Grade 1,  ER/PR +,  invasive DCIS left outer quadrant--  05-30-2015  s/p  left partial mastectomy and snl dissection x1/  completed radiation 08-09-2015   . Family history of breast cancer   . GERD (gastroesophageal reflux disease)   . Hiatal hernia   . History of adenomatous polyp of colon    03-04-2015  tubular adenom's  . History of radiation therapy 07-07-2015  to 08-09-2015   left breast 42.72Gy @ 2.67Gy per 16 fractions and boost 10Gy @ 2Gy per 5 fractions  . 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 History of sepsis 07/21/2017   left hydronephrosis due to left ureter stone obstruction  . Hot flashes   . Hypertension   . Left ureteral stone   . Personal history of radiation therapy   . Wears glasses     Past Surgical History:  Procedure Laterality Date  . BREAST BIOPSY Left 05/03/2015   U/S Core- Benign  . BREAST BIOPSY Right    U/S Core- Benign  . BREAST LUMPECTOMY    . CHOLECYSTECTOMY OPEN  2005  . COLONOSCOPY WITH ESOPHAGOGASTRODUODENOSCOPY (EGD)  03-04-2015   dr [name]  . CYSTOSCOPY WITH RETROGRADE PYELOGRAM, URETEROSCOPY AND STENT  PLACEMENT Left 07/21/2017   Procedure: CYSTOSCOPY WITH RETROGRADE PYELOGRAM, URETEROSCOPY AND STENT PLACEMENT;  Surgeon: [name], MD;  Location: [facility];  Service: Urology;  Laterality: Left;  . CYSTOSCOPY WITH RETROGRADE PYELOGRAM, URETEROSCOPY AND STENT PLACEMENT Left 08/16/2017   Procedure: CYSTOSCOPY WITH RETROGRADE PYELOGRAM, URETEROSCOPY , LASER LITHOTRIPSY, STONE BASKETRY AND STENT REPLACEMENT;  Surgeon: [name], MD;  Location[facility];  Service: Urology;  Laterality: Left;  . DILATATION & CURETTAGE/HYSTEROSCOPY WITH MYOSURE N/A 02/05/2017   Procedure: DILATATION & CURETTAGE/HYSTEROSCOPY WITH MYOSURE;  Surgeon: [name], MD;  Location: [facility];  Service: Gynecology;  Laterality: N/A;  . GASTRIC RESECTION  2006   wedge resection for two GIST tumors (benign)  . HOLMIUM LASER APPLICATION Left 9/67/5916   Procedure: HOLMIUM LASER APPLICATION;  Surgeon: [name], MD;  Location: [facility];  Service: Urology;  Laterality: Left;  . LUMBAR LAMINECTOMY  2000;  2009  . RADIOACTIVE SEED GUIDED PARTIAL MASTECTOMY WITH AXILLARY SENTINEL LYMPH NODE BIOPSY Left 05/30/2015   Procedure: LEFT PARTIAL MASTECTOMY WITH RADIOACTIVE SEED LOCALIZATION, LEFT AXILLARY SENTINEL LYMPH NODE BIOPSY;  Surgeon: Fanny Skates, MD;  Location: [facility];  Service: General;  Laterality: Left;  . TRANSTHORACIC ECHOCARDIOGRAM  07/06/2010  normal echo; ef 70%    There were no vitals filed for this visit.   Subjective Assessment - 07/16/18 1605    Subjective  Patient started to have vaginal dryness started in 2017 when she was having intercourse. No pain with intercourse.     Patient Stated Goals  reduce vaginal dryness    Currently in Pain?  No/denies         [facility] PT Assessment - 07/16/18 0001      Precautions   Precautions  Other (comment)    Precaution Comments  breast cancer      Restrictions    Weight Bearing Restrictions  No      Balance Screen   Has the patient fallen in the past 6 months  No    Has the patient had a decrease in activity level because of a fear of falling?   No    Is the patient reluctant to leave their home because of a fear of falling?   No      Home Film/video editor residence      Prior Function   Level of Independence  Independent    Vocation  Full time employment    Vocation Requirements  sitting, stand up desk    Leisure  walking      Cognition   Overall Cognitive Status  Within Functional Limits for tasks assessed      Posture/Postural Control   Posture/Postural Control  No significant limitations      ROM / Strength   AROM / PROM / Strength  AROM;PROM;Strength      AROM   Overall AROM   Within functional limits for tasks performed      PROM   Overall PROM   Within functional limits for tasks performed    Right Hip External Rotation   40    Left Hip External Rotation   45      Strength   Overall Strength  Within functional limits for tasks performed                Objective measurements completed on examination: See above findings.    Pelvic Floor Special Questions - 07/16/18 0001    Prior Pregnancies  Yes    Number of Vaginal Deliveries  3    Currently Sexually Active  Yes    Marinoff Scale  discomfort that does not affect completion    Urinary Leakage  Yes    Pad use  1 pad for fecal leakage    Activities that cause leaking  Coughing;Sneezing    Fecal incontinence  Yes   randomly; started 1 year ago   Skin Integrity  Intact   dry ness   Pelvic Floor Internal Exam  Patient confirms identification and approves PT to assess pelvic floor muscles and treatment    Exam Type  Vaginal;Rectal    Palpation  decreased mobility of the introitus    Strength  Flicker   for rectum and vagina   Tone  low tone               PT Education - 07/16/18 1651    Education Details  instruction on  vaginal moisturizers and lubricants; pelvic floor exercise    Person(s) Educated  Patient    Methods  Explanation;Demonstration;Verbal cues;Handout    Comprehension  Returned demonstration;Verbalized understanding       PT Short Term Goals - 07/16/18 1658      PT SHORT  TERM GOAL #1   Title  independent with initial HEP    Time  4    Period  Weeks    Status  New    Target Date  08/13/18      PT SHORT TERM GOAL #2   Title  understand how to use vaginal moisturizers and lubricants to improve vaginal health    Time  4    Period  Weeks    Status  New    Target Date  08/13/18      PT SHORT TERM GOAL #3   Title  understand toileting technique to fully empty her stools    Time  4    Period  Weeks    Status  New    Target Date  09/10/18      PT SHORT TERM GOAL #4   Title  pelvic floor strength >/= 2/5 holding for 5 seconds to reduce urinary and fecal leakage    Time  4    Period  Weeks    Status  New    Target Date  08/13/18        PT Long Term Goals - 07/16/18 1644      PT LONG TERM GOAL #1   Title  independent with HEP and understand how to progress herself    Time  8    Period  Weeks    Status  New    Target Date  09/10/18      PT LONG TERM GOAL #2   Title  minimal to no  urinary leakage with laughing, sneezing and coughing due to pelvic floor strength >/= 3/5    Time  8    Period  Weeks    Status  New    Target Date  09/10/18      PT LONG TERM GOAL #3   Title   reduction of fecal leakage with minmal tto none during the day due to pelvic floor strength >/= 3/5    Time  8    Period  Weeks    Status  New    Target Date  09/10/18      PT LONG TERM GOAL #4   Title  discomfort with penile penetration decreased >/= 75% due to improved vaginal health    Time  8    Period  Weeks    Status  New    Target Date  09/10/18             Plan - 07/16/18 1651    Clinical Impression Statement  Patient is a 67 year old female with vaginal dryness since she has  had breast cancer. Patient als reports urinary leakage with lauhing, coughing, and sneezing. Patient has fecal incontinence during the day and has to wear a pad.  Pelvic floor strength for vaginal and anal is 1/5.  When patient contracts the vaginal there is a flicker on the sides.  Dryness is noted on the outside of the vaginal.  Patient would benefit from skilled therapy to improve tissue mobility and strength to redue vaginal and fecal leakage.     History and Personal Factors relevant to plan of care:  left  breast cancer 05/02/2018; masectomy 08/09/2015; radiation 07/06/2018-08/08/2018; presently taking Tamoxifen; lumbar lamenectomy    Clinical Presentation  Stable    Clinical Presentation due to:  stable condition    Clinical Decision Making  Low    Rehab Potential  Excellent    Clinical Impairments Affecting Rehab Potential  left  breast cancer 05/02/2018; masectomy 08/09/2015; radiation 07/06/2018-08/08/2018; presently taking Tamoxifen; lumbar lamenectomy    PT Frequency  1x / week    PT Duration  8 weeks    PT Treatment/Interventions  Biofeedback;Therapeutic exercise;Therapeutic activities;Neuromuscular re-education;Patient/family education;Manual techniques;Dry needling    PT Next Visit Plan  instruction on toileting technique; abdominal massage; abdominal bracing; pelvic floor EMG; tapping of pelvic floor muscles    Consulted and Agree with Plan of Care  Patient       Patient will benefit from skilled therapeutic intervention in order to improve the following deficits and impairments:  Increased fascial restricitons, Increased muscle spasms, Impaired tone, Decreased activity tolerance, Decreased endurance, Decreased strength  Visit Diagnosis: Muscle weakness (generalized) - Plan: PT plan of care cert/re-cert  Other lack of coordination - Plan: PT plan of care cert/re-cert     Problem List Patient Active Problem List   Diagnosis Date Noted  . Poor sleep 11/25/2017  . Essential  hypertension 11/22/2016  . Gastroesophageal reflux disease 11/22/2016  . Genetic testing 09/06/2015  . Family history of breast cancer   . Malignant neoplasm of lower-outer quadrant of left breast of female, estrogen receptor positive ([place]) 05/06/2015    Earlie Counts, PT 07/16/18 5:03 PM   [facility] [address] [place], [place], [address] Phone: [phone]   Fax:  [phone]  Name: Krista Armstrong MRN: [id number] Date of Birth: [birth year]

## 2018-07-25 ENCOUNTER — Encounter

## 2018-07-30 ENCOUNTER — Encounter: Payer: Medicare Other | Admitting: Physical Therapy

## 2018-08-08 ENCOUNTER — Ambulatory Visit (AMBULATORY_SURGERY_CENTER): Payer: Self-pay

## 2018-08-08 VITALS — Ht 66.0 in | Wt 166.8 lb

## 2018-08-08 DIAGNOSIS — Z86010 Personal history of colonic polyps: Secondary | ICD-10-CM

## 2018-08-08 MED ORDER — PEG 3350-KCL-NA BICARB-NACL 420 G PO SOLR: 4000.0000 mL | Freq: Once | ORAL | 0 refills | Status: AC

## 2018-08-08 NOTE — Progress Notes (Signed)
No egg or soy allergy known to patient  No issues with past sedation with any surgeries  or procedures, no intubation problems  No diet pills per patient No home 02 use per patient  No blood thinners per patient  Pt denies issues with constipation  No A fib or A flutter  EMMI video sent to pt's e mail  

## 2018-08-22 ENCOUNTER — Ambulatory Visit (AMBULATORY_SURGERY_CENTER): Payer: 59 | Admitting: Gastroenterology

## 2018-08-22 ENCOUNTER — Encounter: Payer: Self-pay | Admitting: Gastroenterology

## 2018-08-22 VITALS — BP 135/69 | HR 72 | Temp 98.0°F | Resp 17 | Ht 67.0 in | Wt 164.0 lb

## 2018-08-22 DIAGNOSIS — Z85038 Personal history of other malignant neoplasm of large intestine: Secondary | ICD-10-CM

## 2018-08-22 DIAGNOSIS — Z853 Personal history of malignant neoplasm of breast: Secondary | ICD-10-CM | POA: Diagnosis not present

## 2018-08-22 DIAGNOSIS — I10 Essential (primary) hypertension: Secondary | ICD-10-CM | POA: Diagnosis not present

## 2018-08-22 DIAGNOSIS — Z86010 Personal history of colonic polyps: Secondary | ICD-10-CM | POA: Diagnosis not present

## 2018-08-22 MED ORDER — SODIUM CHLORIDE 0.9 % IV SOLN: 500.0000 mL | Freq: Once | INTRAVENOUS | Status: AC

## 2018-08-22 NOTE — Op Note (Signed)
[facility] Patient Name: Krista Armstrong Procedure Date: 08/22/2018 1:31 PM MRN: [id number] Endoscopist: Milus Banister , MD Age: 67 Referring MD:  Date of Birth: 1950/12/23 Gender: Female Account #: [id number] Procedure:                Colonoscopy Indications:              High risk colon cancer surveillance: Personal                            history of colonic polyps; colonoscopy 2016 found                            three subCM adenomas Medicines:                Monitored Anesthesia Care Procedure:                Pre-Anesthesia Assessment:                           - Prior to the procedure, a History and Physical                            was performed, and patient medications and                            allergies were reviewed. The patient's tolerance of                            previous anesthesia was also reviewed. The risks                            and benefits of the procedure and the sedation                            options and risks were discussed with the patient.                            All questions were answered, and informed consent                            was obtained. Prior Anticoagulants: The patient has                            taken no previous anticoagulant or antiplatelet                            agents. ASA Grade Assessment: II - A patient with                            mild systemic disease. After reviewing the risks                            and benefits, the patient was deemed in  satisfactory condition to undergo the procedure.                           After obtaining informed consent, the colonoscope                            was passed under direct vision. Throughout the                            procedure, the patient's blood pressure, pulse, and                            oxygen saturations were monitored continuously. The                            Colonoscope was introduced through the anus  and                            advanced to the the cecum, identified by                            appendiceal orifice and ileocecal valve. The                            colonoscopy was performed without difficulty. The                            patient tolerated the procedure well. The quality                            of the bowel preparation was good. The ileocecal                            valve, appendiceal orifice, and rectum were                            photographed. Scope In: 1:34:56 PM Scope Out: 1:45:01 PM Scope Withdrawal Time: 0 hours 5 minutes 34 seconds  Total Procedure Duration: 0 hours 10 minutes 5 seconds  Findings:                 The entire examined colon appeared normal on direct                            and retroflexion views. Complications:            No immediate complications. Estimated blood loss:                            None. Estimated Blood Loss:     Estimated blood loss: none. Impression:               - The entire examined colon is normal on direct and                            retroflexion views.                           -  No specimens collected. Recommendation:           - Patient has a contact number available for                            emergencies. The signs and symptoms of potential                            delayed complications were discussed with the                            patient. Return to normal activities tomorrow.                            Written discharge instructions were provided to the                            patient.                           - Resume previous diet.                           - Continue present medications.                           - Repeat colonoscopy in 5 years for surveillance                            (HRA 3 years ago). Milus Banister, MD 08/22/2018 1:47:19 PM This report has been signed electronically.

## 2018-08-22 NOTE — Progress Notes (Signed)
No changes in medical or surgical hx since PV per pt 

## 2018-08-22 NOTE — Patient Instructions (Signed)
YOU HAD AN ENDOSCOPIC PROCEDURE TODAY AT THE [facility]:   Refer to the procedure report that was given to you for any specific questions about what was found during the examination.  If the procedure report does not answer your questions, please call your gastroenterologist to clarify.  If you requested that your care partner not be given the details of your procedure findings, then the procedure report has been included in a sealed envelope for you to review at your convenience later.  YOU SHOULD EXPECT: Some feelings of bloating in the abdomen. Passage of more gas than usual.  Walking can help get rid of the air that was put into your GI tract during the procedure and reduce the bloating. If you had a lower endoscopy (such as a colonoscopy or flexible sigmoidoscopy) you may notice spotting of blood in your stool or on the toilet paper. If you underwent a bowel prep for your procedure, you may not have a normal bowel movement for a few days.  Please Note:  You might notice some irritation and congestion in your nose or some drainage.  This is from the oxygen used during your procedure.  There is no need for concern and it should clear up in a day or so.  SYMPTOMS TO REPORT IMMEDIATELY:   Following lower endoscopy (colonoscopy or flexible sigmoidoscopy):  Excessive amounts of blood in the stool  Significant tenderness or worsening of abdominal pains  Swelling of the abdomen that is new, acute  Fever of 100F or higher  For urgent or emergent issues, a gastroenterologist can be reached at any hour by calling [phone].   DIET:  We do recommend a small meal at first, but then you may proceed to your regular diet.  Drink plenty of fluids but you should avoid alcoholic beverages for 24 hours.  ACTIVITY:  You should plan to take it easy for the rest of today and you should NOT DRIVE or use heavy machinery until tomorrow (because of the sedation medicines used during the test).     FOLLOW UP: Our staff will call the number listed on your records the next business day following your procedure to check on you and address any questions or concerns that you may have regarding the information given to you following your procedure. If we do not reach you, we will leave a message.  However, if you are feeling well and you are not experiencing any problems, there is no need to return our call.  We will assume that you have returned to your regular daily activities without incident.  If any biopsies were taken you will be contacted by phone or by letter within the next 1-3 weeks.  Please call us at [phone] if you have not heard about the biopsies in 3 weeks.    SIGNATURES/CONFIDENTIALITY: You and/or your care partner have signed paperwork which will be entered into your electronic medical record.  These signatures attest to the fact that that the information above on your After Visit Summary has been reviewed and is understood.  Full responsibility of the confidentiality of this discharge information lies with you and/or your care-partner. 

## 2018-08-25 ENCOUNTER — Telehealth: Payer: Self-pay

## 2018-08-25 NOTE — Telephone Encounter (Signed)
  Follow up Call-  Call back number 08/22/2018  Post procedure Call Back phone  # [phone]  Permission to leave phone message No  Some recent data might be hidden     Patient questions:  Do you have a fever, pain , or abdominal swelling? No. Pain Score  0 *  Have you tolerated food without any problems? Yes.    Have you been able to return to your normal activities? Yes.    Do you have any questions about your discharge instructions: Diet   No. Medications  No. Follow up visit  No.  Do you have questions or concerns about your Care? No.  Actions: * If pain score is 4 or above: No action needed, pain <4.

## 2018-09-05 ENCOUNTER — Other Ambulatory Visit: Payer: Self-pay | Admitting: Family Medicine

## 2018-09-22 ENCOUNTER — Other Ambulatory Visit: Payer: Self-pay | Admitting: Family Medicine

## 2018-11-10 ENCOUNTER — Encounter: Payer: Self-pay | Admitting: Family Medicine

## 2018-11-10 ENCOUNTER — Ambulatory Visit (INDEPENDENT_AMBULATORY_CARE_PROVIDER_SITE_OTHER): Payer: 59 | Admitting: Family Medicine

## 2018-11-10 VITALS — BP 124/86 | HR 84 | Temp 98.7°F | Resp 12 | Ht 67.0 in | Wt 169.4 lb

## 2018-11-10 DIAGNOSIS — N898 Other specified noninflammatory disorders of vagina: Secondary | ICD-10-CM

## 2018-11-10 MED ORDER — TERCONAZOLE 0.4 % VA CREA: 1.0000 | TOPICAL_CREAM | Freq: Every day | VAGINAL | 0 refills | Status: AC

## 2018-11-10 MED ORDER — FLUCONAZOLE 150 MG PO TABS: 150.0000 mg | ORAL_TABLET | ORAL | 0 refills | Status: AC

## 2018-11-10 NOTE — Progress Notes (Signed)
ACUTE VISIT   HPI:  Chief Complaint  Patient presents with  . Vaginitis    Sx started Thursday    KristaKrista Armstrong is a 67 y.o. female, who is here today complaining of 4 days of vaginal discharge,whitish, and vaginal pruritus. She has not noted vaginal bleeding.  She was seen on 06/17/2017 due to vaginal pruritus, according to pt, she was diagnosed with vaginal yeast and treated with oral medication.  She is sexually active. Denies risk factors for STD's.  This is her second episode this year. According to pt, she has already had pelvic exam last time she had symptoms. She denies fever,chills,abdominal pain,rash,or associated arthralgias.  She has not identified exacerbating or alleviating symptoms.  She denies urinary symptoms. She tried OTC Vagisil and Monistat, did not help.  Problem has been stable.  She has Hx of breast cancer in 2016 and has been on Tamoxifen for a couple year. She follows with oncologist, Dr [name].   Review of Systems  Constitutional: Negative for activity change, appetite change, fatigue and fever.  Gastrointestinal: Negative for abdominal pain, nausea and vomiting.       No changes in bowel habits.  Genitourinary: Positive for vaginal discharge. Negative for decreased urine volume, dysuria, pelvic pain and vaginal bleeding.  Musculoskeletal: Negative for arthralgias and joint swelling.  Skin: Negative for rash.     Current Outpatient Medications on File Prior to Visit  Medication Sig Dispense Refill  . amLODipine (NORVASC) 5 MG tablet TAKE ONE TABLET BY MOUTH DAIYL 90 tablet 0  . Biotin 1000 MCG CHEW Chew 5,000 mg by mouth daily.    Marland Kitchen esomeprazole (NEXIUM) 40 MG capsule Take 1 capsule (40 mg total) by mouth daily. 90 capsule 1  . gabapentin (NEURONTIN) 300 MG capsule Take 2 capsules (600 mg total) by mouth at bedtime. 90 capsule 4  . losartan (COZAAR) 50 MG tablet TAKE 1 TABLET BY MOUTH DAILY 90 tablet 1  . tamoxifen (NOLVADEX)  20 MG tablet TAKE ONE TABLET BY MOUTH DAILY 90 tablet 0   Current Facility-Administered Medications on File Prior to Visit  Medication Dose Route Frequency Provider Last Rate Last Dose  . 0.9 %  sodium chloride infusion  500 mL Intravenous Once Milus Banister, MD         Past Medical History:  Diagnosis Date  . Breast cancer ([place]) DX 05/03/15---  oncologiist-  dr [name]--- per LOV note -- no recurrence   Stage 1A ( T1b, N0, M0), Grade 1,  ER/PR +,  invasive DCIS left outer quadrant--  05-30-2015  s/p  left partial mastectomy and snl dissection x1/  completed radiation 08-09-2015   . Family history of breast cancer   . GERD (gastroesophageal reflux disease)   . Hiatal hernia   . History of adenomatous polyp of colon    03-04-2015  tubular adenom's  . History of radiation therapy 07-07-2015  to 08-09-2015   left breast 42.72Gy @ 2.67Gy per 16 fractions and boost 10Gy @ 2Gy per 5 fractions  . 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 History of sepsis 07/21/2017   left hydronephrosis due to left ureter stone obstruction  . Hot flashes   . Hypertension   . Left ureteral stone   . Personal history of radiation therapy   . Wears glasses    Allergies  Allergen Reactions  . Vicodin [Hydrocodone-Acetaminophen] Other (  See Comments)    Massive headache    Social History   Socioeconomic History  . Marital status: Married    Spouse name: Not on file  . Number of children: 3  . Years of education: Not on file  . Highest education level: Not on file  Occupational History  . Occupation: [employer]  . Financial resource strain: Not on file  . Food insecurity:    Worry: Not on file    Inability: Not on file  . Transportation needs:    Medical: Not on file    Non-medical: Not on file  Tobacco Use  . Smoking status: Never Smoker  . Smokeless tobacco: Never Used  Substance and Sexual Activity  .  Alcohol use: Yes    Alcohol/week: 7.0 standard drinks    Types: 7 Glasses of wine per week    Comment: wine every  night   . Drug use: No  . Sexual activity: Yes    Partners: Male    Birth control/protection: Post-menopausal  Lifestyle  . Physical activity:    Days per week: Not on file    Minutes per session: Not on file  . Stress: Not on file  Relationships  . Social connections:    Talks on phone: Not on file    Gets together: Not on file    Attends religious service: Not on file    Active member of club or organization: Not on file    Attends meetings of clubs or organizations: Not on file    Relationship status: Not on file  Other Topics Concern  . Not on file  Social History Narrative  . Not on file    Vitals:   11/10/18 0922  BP: 124/86  Pulse: 84  Resp: 12  Temp: 98.7 F (37.1 C)  SpO2: 96%   Body mass index is 26.53 kg/m.   Physical Exam  Nursing note and vitals reviewed. Constitutional: She is oriented to person, place, and time. She appears well-developed and well-nourished. No distress.  HENT:  Head: Normocephalic and atraumatic.  Eyes: Conjunctivae are normal.  Cardiovascular: Normal rate and regular rhythm.  Respiratory: Effort normal and breath sounds normal. No respiratory distress.  GI: Soft. She exhibits no mass. There is no hepatomegaly. There is no abdominal tenderness.  Genitourinary:    Genitourinary Comments: Deferred to gyn.   Musculoskeletal:        General: No edema.  Neurological: She is alert and oriented to person, place, and time. She has normal strength. Gait normal.  Skin: Skin is warm. No erythema.  Psychiatric: She has a normal mood and affect.  Well groomed,good eye contact.      ASSESSMENT AND PLAN:  Krista Armstrong was seen today for vaginitis.  Diagnoses and all orders for this visit:  Vaginal discharge We discussed possible etiologies. She is not interested in pelvic exam today,states that it has been done for  similar symptoms,so I will treat empirically as fungal vulvovaginitis. Some side effects from meds discussed. She is established with gyn,recommend arranging appt if symptoms are persistent.   -     fluconazole (DIFLUCAN) 150 MG tablet; Take 1 tablet (150 mg total) by mouth once a week for 2 doses. -     terconazole (TERAZOL 7) 0.4 % vaginal cream; Place 1 applicator vaginally at bedtime for 10 days.  Vaginal pruritus  Vaginal atrophy could also cause or contribute to problem. Other possible causes discussed. F/U with PCP or gyn  if needed.  -     fluconazole (DIFLUCAN) 150 MG tablet; Take 1 tablet (150 mg total) by mouth once a week for 2 doses. -     terconazole (TERAZOL 7) 0.4 % vaginal cream; Place 1 applicator vaginally at bedtime for 10 days.   Return if symptoms worsen or fail to improve.        G. Martinique, MD  [facility]. [place] office.

## 2018-11-10 NOTE — Patient Instructions (Addendum)
  [name] I have seen you today for an acute visit.  A few things to remember from today's visit:   Vaginal discharge - Plan: fluconazole (DIFLUCAN) 150 MG tablet, terconazole (TERAZOL 7) 0.4 % vaginal cream  Vaginal pruritus - Plan: fluconazole (DIFLUCAN) 150 MG tablet, terconazole (TERAZOL 7) 0.4 % vaginal cream   If medications prescribed today, they will not be refill upon request, a follow up appointment with PCP will be necessary to discuss continuation of of treatment if appropriate.   Today I will treat empirically has just infection. Tamoxifen can also cause similar symptoms due to vaginal atrophy.  In general please monitor for signs of worsening symptoms and seek immediate medical attention if any concerning.  Recommend following with your gynecologist if problem is persistent.  I hope you get better soon!

## 2018-11-21 IMAGING — CT CT ANGIO CHEST
2 of 7 series · 19 of 46 positions shown · IV contrast (ISOVUE 370)
Comparison: Chest x-ray 08/01/2017

CLINICAL DATA: Elevated D-dimer.  Shortness of breath

EXAM:
CT ANGIOGRAPHY CHEST WITH CONTRAST
TECHNIQUE: Multidetector CT imaging of the chest was performed using the
standard protocol during bolus administration of intravenous
contrast. Multiplanar CT image reconstructions and MIPs were
obtained to evaluate the vascular anatomy.
CONTRAST:  80 cc Isovue 370 IV

[Series 5: thins · axial · 0.62mm/px · z∈[-345,-72]mm · 16 of 300 slices shown]
[im 14/300  lung]
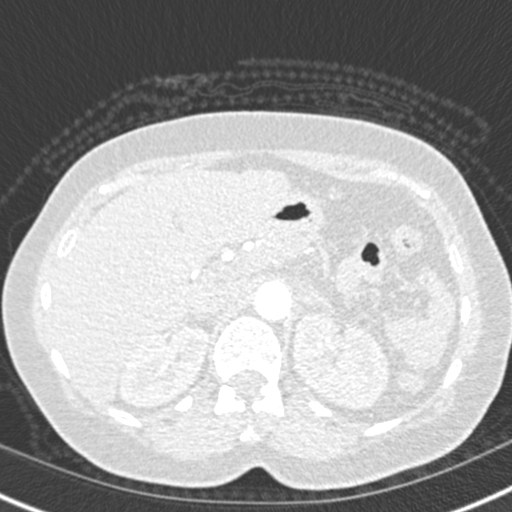
[im 40/300  soft-tissue]
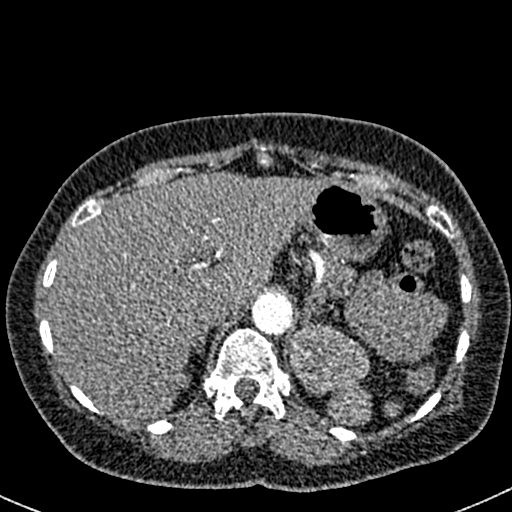
[im 53/300  lung]
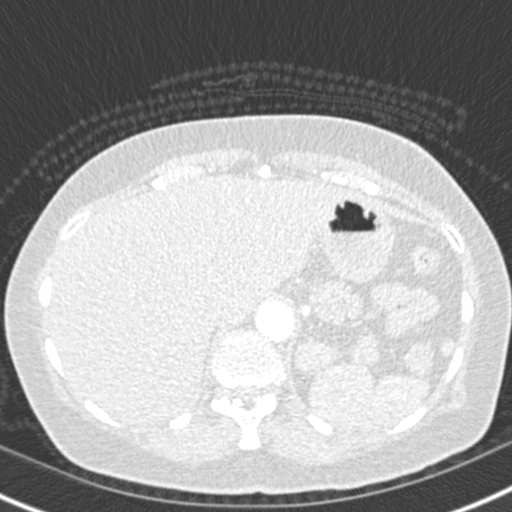
[im 66/300  soft-tissue]
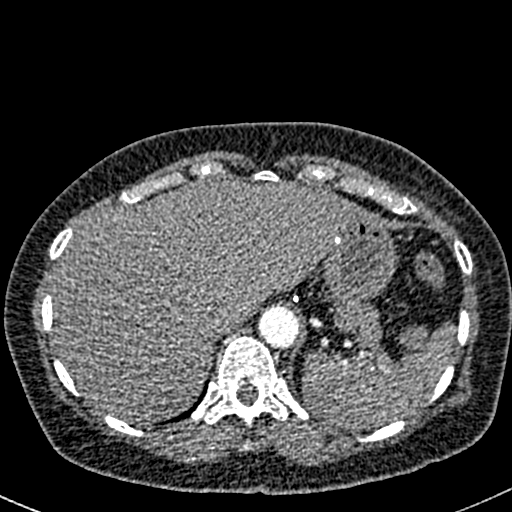
[im 92/300  lung]
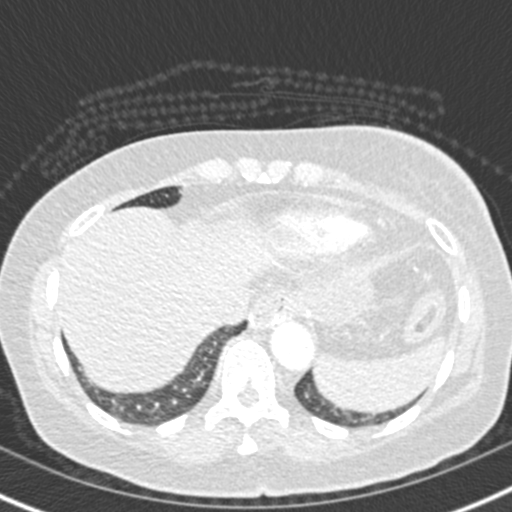
[im 105/300  soft-tissue]
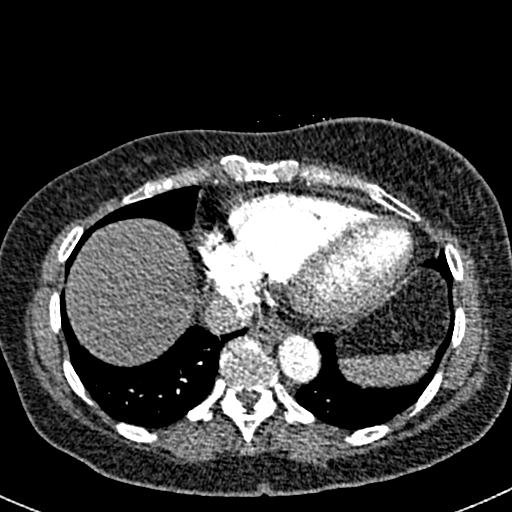
[im 118/300  lung]
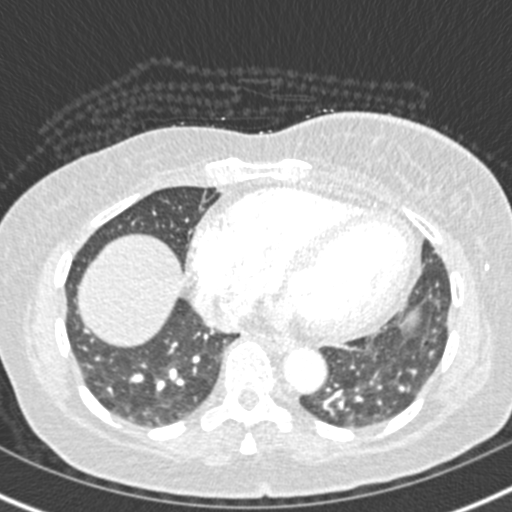
[im 144/300  soft-tissue]
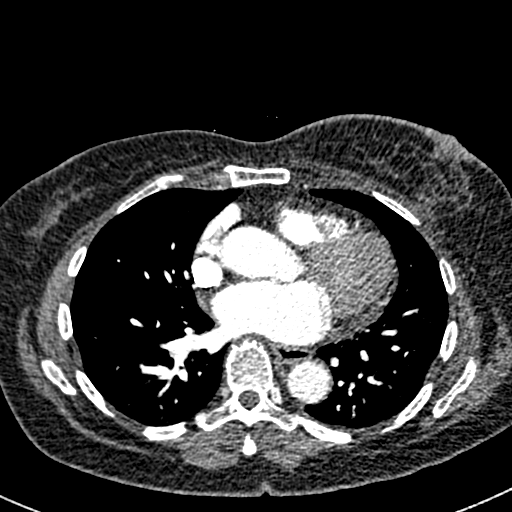
[im 157/300  lung]
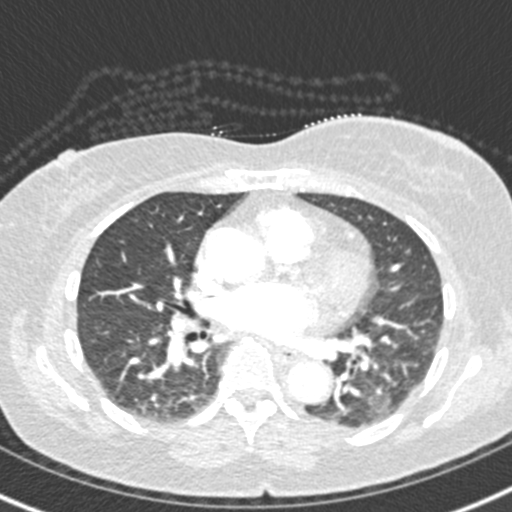
[im 183/300  soft-tissue]
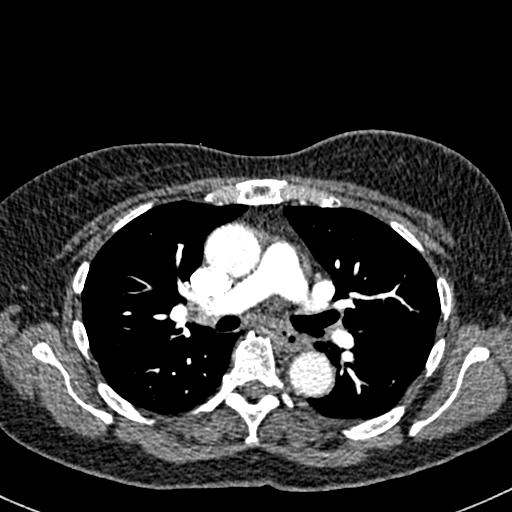
[im 196/300  lung]
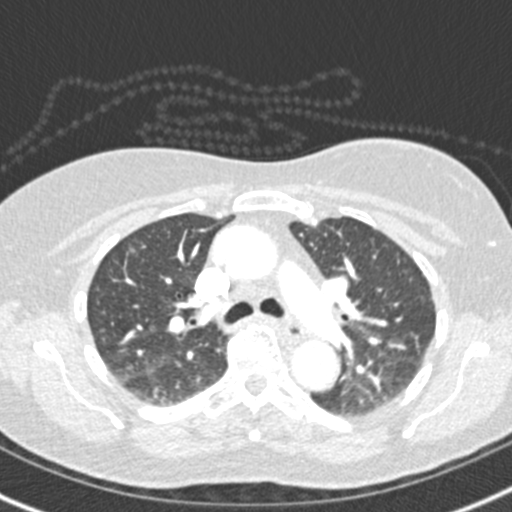
[im 209/300  soft-tissue]
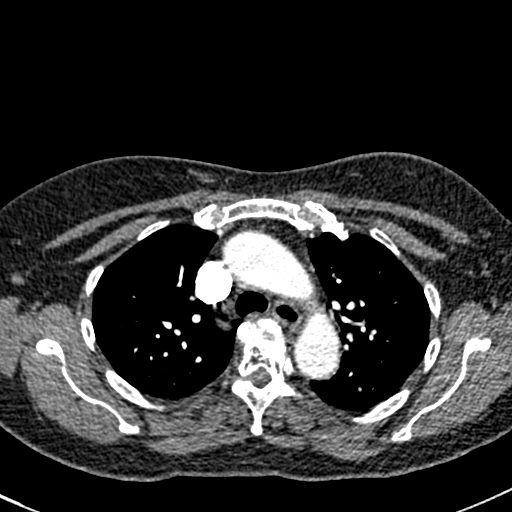
[im 235/300  lung]
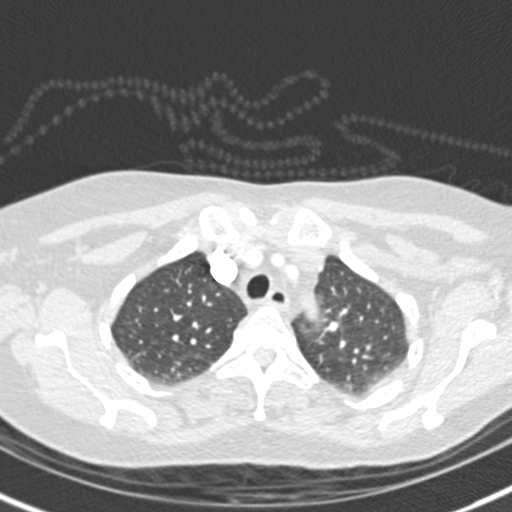
[im 248/300  soft-tissue]
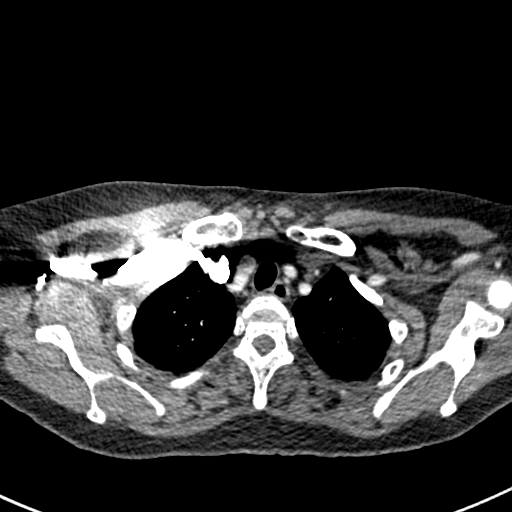
[im 261/300  lung]
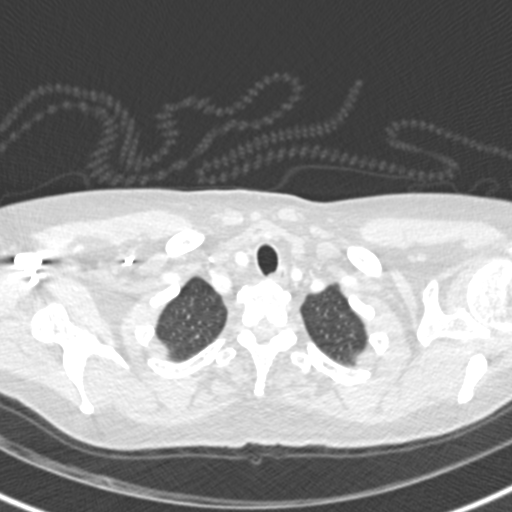
[im 287/300  soft-tissue]
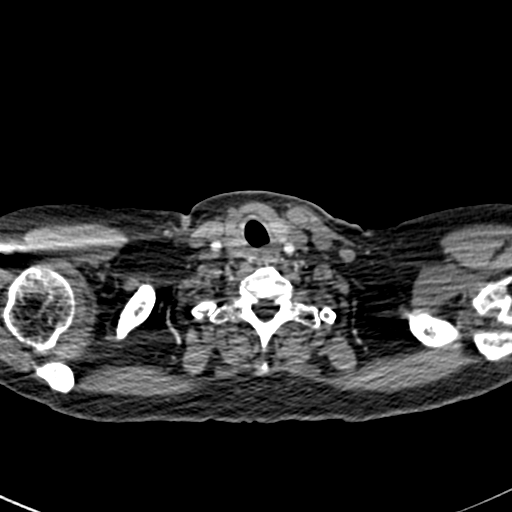

[Series 7: coronal mpr · coronal · 0.59mm/px · 3 of 104 slices shown]
[im 26/104  soft-tissue]
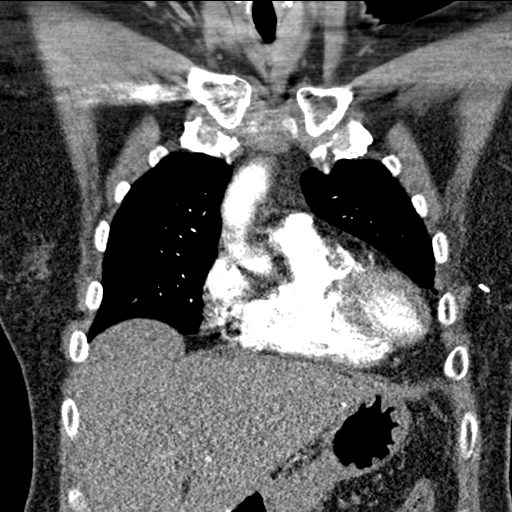
[im 52/104  soft-tissue]
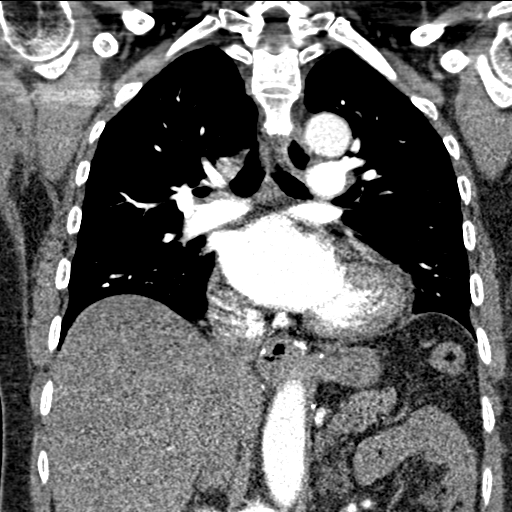
[im 78/104  soft-tissue]
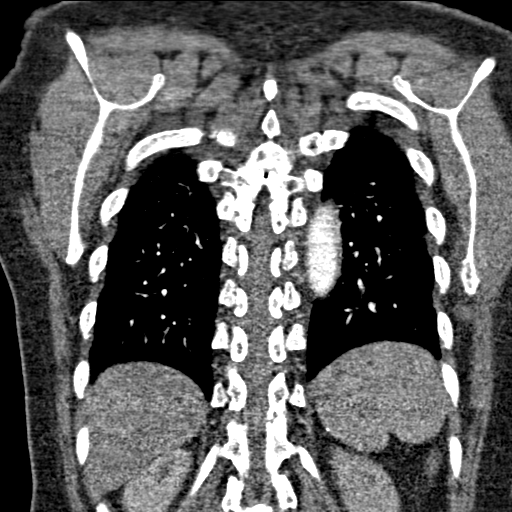

[19 of 46 positions shown; findings below may reference images not displayed]

FINDINGS: Cardiovascular: No filling defects in the pulmonary arteries to
suggest pulmonary emboli. Heart is borderline in size. Aorta is
normal caliber.

Mediastinum/Nodes: No mediastinal, hilar, or axillary adenopathy.
Trachea and esophagus are unremarkable.

Lungs/Pleura: Lungs are clear. No focal airspace opacities or
suspicious nodules. No effusions.

Upper Abdomen: Postoperative changes in the stomach and from prior
cholecystectomy. No acute findings.

Musculoskeletal: Chest wall soft tissues are unremarkable. No acute
bony abnormality.

Review of the MIP images confirms the above findings.
IMPRESSION: No evidence of pulmonary embolus.  No acute cardiopulmonary disease.

## 2018-12-02 ENCOUNTER — Encounter: Payer: Medicare Other | Admitting: Family Medicine

## 2018-12-07 NOTE — Progress Notes (Addendum)
Medicare Annual Preventive Care Visit  (initial annual wellness or annual wellness exam)  Concerns and/or follow up today: Due for flu vaccine, pneumococcal vaccine, recheck Hgba1c, ? DEXA I do not rx opioids for this patient.  PMH Breast Ca (seeing oncology for management, Dr. [name]), nephrolithiasis (sees urology), hyperglycemia and poor sleep (oncologist prescribed gabapentin for her.) Reports is doing well, except new concern of L hip pain. Started acutely when moving in October 2019. She thought pulled a muscle. Sharp and mod - severe pain L groin with certain movements of the hip. No radiation, weakness, numbness. Higher dose ibuprofen and muscle relaxer help a little - not taking often. Not doing better.  Sees gynecologist, Sumner Boast. Reports had dexa last year and normal. She can't remember who ordered. Declined today.  See HM section in Epic for other details of completed HM. See scanned documentation under Media Tab for further documentation HPI, health risk assessment. See Media Tab and Care Teams sections in Epic for other providers.  ROS: negative for report of fevers, unintentional weight loss, vision changes, vision loss, hearing loss or change, chest pain, sob, hemoptysis, melena, hematochezia, hematuria, genital discharge or lesions, falls, bleeding or bruising, loc, thoughts of suicide or self harm, memory loss  1.) Patient-completed health risk assessment  - completed and reviewed, see scanned documentation  2.) Review of Medical History: -PMH, PSH, Family History and current specialty and care providers reviewed and updated and listed below  - see scanned in document in chart and below  Past Medical History:  Diagnosis Date  . Breast cancer ([place]) DX 05/03/15---  oncologiist-  dr [name]--- per LOV note -- no recurrence   Stage 1A ( T1b, N0, M0), Grade 1,  ER/PR +,  invasive DCIS left outer quadrant--  05-30-2015  s/p  left partial mastectomy and snl dissection x1/   completed radiation 08-09-2015   . Family history of breast cancer   . GERD (gastroesophageal reflux disease)   . Hiatal hernia   . History of adenomatous polyp of colon    03-04-2015  tubular adenom's  . History of radiation therapy 07-07-2015  to 08-09-2015   left breast 42.72Gy @ 2.67Gy per 16 fractions and boost 10Gy @ 2Gy per 5 fractions  . 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 History of sepsis 07/21/2017   left hydronephrosis due to left ureter stone obstruction  . Hot flashes   . Hypertension   . Left ureteral stone   . Personal history of radiation therapy   . Wears glasses     Past Surgical History:  Procedure Laterality Date  . BREAST BIOPSY Left 05/03/2015   U/S Core- Benign  . BREAST BIOPSY Right    U/S Core- Benign  . BREAST LUMPECTOMY    . CHOLECYSTECTOMY OPEN  2005  . COLONOSCOPY WITH ESOPHAGOGASTRODUODENOSCOPY (EGD)  03-04-2015   dr [name]  . CYSTOSCOPY WITH RETROGRADE PYELOGRAM, URETEROSCOPY AND STENT PLACEMENT Left 07/21/2017   Procedure: CYSTOSCOPY WITH RETROGRADE PYELOGRAM, URETEROSCOPY AND STENT PLACEMENT;  Surgeon: [name], MD;  Location: [facility];  Service: Urology;  Laterality: Left;  . CYSTOSCOPY WITH RETROGRADE PYELOGRAM, URETEROSCOPY AND STENT PLACEMENT Left 08/16/2017   Procedure: CYSTOSCOPY WITH RETROGRADE PYELOGRAM, URETEROSCOPY , LASER LITHOTRIPSY, STONE BASKETRY AND STENT REPLACEMENT;  Surgeon: [name], MD;  Location: [facility];  Service: Urology;  Laterality: Left;  . DILATATION & CURETTAGE/HYSTEROSCOPY WITH MYOSURE  N/A 02/05/2017   Procedure: DILATATION & CURETTAGE/HYSTEROSCOPY WITH MYOSURE;  Surgeon: [name], MD;  Location: [facility];  Service: Gynecology;  Laterality: N/A;  . GASTRIC RESECTION  2006   wedge resection for two GIST tumors (benign)  . HOLMIUM LASER APPLICATION Left 3/50/0938   Procedure: HOLMIUM LASER  APPLICATION;  Surgeon: [name], MD;  Location: [facility];  Service: Urology;  Laterality: Left;  . LUMBAR LAMINECTOMY  2000;  2009  . RADIOACTIVE SEED GUIDED PARTIAL MASTECTOMY WITH AXILLARY SENTINEL LYMPH NODE BIOPSY Left 05/30/2015   Procedure: LEFT PARTIAL MASTECTOMY WITH RADIOACTIVE SEED LOCALIZATION, LEFT AXILLARY SENTINEL LYMPH NODE BIOPSY;  Surgeon: Fanny Skates, MD;  Location: [place];  Service: General;  Laterality: Left;  . TRANSTHORACIC ECHOCARDIOGRAM  07/06/2010   normal echo; ef 70%    Social History   Socioeconomic History  . Marital status: Married    Spouse name: Not on file  . Number of children: 3  . Years of education: Not on file  . Highest education level: Not on file  Occupational History  . Occupation: [employer]  . Financial resource strain: Not on file  . Food insecurity:    Worry: Not on file    Inability: Not on file  . Transportation needs:    Medical: Not on file    Non-medical: Not on file  Tobacco Use  . Smoking status: Never Smoker  . Smokeless tobacco: Never Used  Substance and Sexual Activity  . Alcohol use: Yes    Alcohol/week: 7.0 standard drinks    Types: 7 Glasses of wine per week    Comment: wine every  night   . Drug use: No  . Sexual activity: Yes    Partners: Male    Birth control/protection: Post-menopausal  Lifestyle  . Physical activity:    Days per week: Not on file    Minutes per session: Not on file  . Stress: Not on file  Relationships  . Social connections:    Talks on phone: Not on file    Gets together: Not on file    Attends religious service: Not on file    Active member of club or organization: Not on file    Attends meetings of clubs or organizations: Not on file    Relationship status: Not on file  . Intimate partner violence:    Fear of current or ex partner: Not on file    Emotionally abused: Not on file    Physically abused: Not on file     Forced sexual activity: Not on file  Other Topics Concern  . Not on file  Social History Narrative  . Not on file    Family History  Problem Relation Age of Onset  . Breast cancer Mother 67  . Stroke Father   . Heart disease Maternal Grandmother   . Cervical cancer Maternal Aunt   . Colon cancer Neg Hx   . Colon polyps Neg Hx   . Diabetes Neg Hx   . Kidney disease Neg Hx   . Esophageal cancer Neg Hx   . Gallbladder disease Neg Hx     Current Outpatient Medications on File Prior to Visit  Medication Sig Dispense Refill  . amLODipine (NORVASC) 5 MG tablet TAKE ONE TABLET BY MOUTH DAIYL 90 tablet 0  . Biotin 1000 MCG CHEW Chew 5,000 mg by mouth daily.    Marland Kitchen esomeprazole (NEXIUM) 40 MG capsule Take  1 capsule (40 mg total) by mouth daily. 90 capsule 1  . gabapentin (NEURONTIN) 300 MG capsule Take 2 capsules (600 mg total) by mouth at bedtime. 90 capsule 4  . losartan (COZAAR) 50 MG tablet TAKE 1 TABLET BY MOUTH DAILY 90 tablet 1  . tamoxifen (NOLVADEX) 20 MG tablet TAKE ONE TABLET BY MOUTH DAILY 90 tablet 0   Current Facility-Administered Medications on File Prior to Visit  Medication Dose Route Frequency Provider Last Rate Last Dose  . 0.9 %  sodium chloride infusion  500 mL Intravenous Once Milus Banister, MD         3.) Review of functional ability and level of safety:  Any difficulty hearing?  See scanned documentation  History of falling?  See scanned documentation  Any trouble with IADLs - using a phone, using transportation, grocery shopping, preparing meals, doing housework, doing laundry, taking medications and managing money?  See scanned documentation  Advance Directives? Reports done and content with current.  See summary of recommendations in Patient Instructions below.  4.) Physical Exam Vitals:   12/08/18 1029  BP: 110/78  Pulse: 80  Temp: 98.4 F (36.9 C)   Estimated body mass index is 26.96 kg/m as calculated from the following:   Height as  of this encounter: 5' 6.25" (1.683 m).   Weight as of this encounter: 168 lb 4.8 oz (76.3 kg).  EKG (optional): deferred  General: alert, appear well hydrated and in no acute distress  HEENT: visual acuity grossly intact, see formal eye exam in epic  CV: HRRR  Lungs: CTA bilaterally  MS: mild TTP L groin just inferior to L hip socket, neg hip comp test, intermittent pain with extremes of int/ext rotation of the L hip, normal gait, normal functioning le bilat, NV intact distal  Psych: pleasant and cooperative, no obvious depression or anxiety - See phq9 in epic  Cognitive function grossly intact  See patient instructions for recommendations.  Education and counseling regarding the above review of health provided with a plan for the following: -see scanned patient completed form for further details -fall prevention strategies discussed  -healthy lifestyle discussed -importance and resources for completing advanced directives discussed -see patient instructions below for any other recommendations provided  4)The following written screening schedule of preventive measures were reviewed with assessment and plan made per below, orders and patient instructions:      AAA screening done if applicable     Alcohol screening done     Obesity Screening and counseling done     STI screening (Hep C if born 60-65) offered and per pt wishes     Tobacco Screening done        Pneumococcal (PPSV23 -one dose after 64, one before if risk factors), influenza yearly and hepatitis B vaccines (if high risk - end stage renal disease, IV drugs, homosexual men, live in home for mentally retarded, hemophilia receiving factors) ASSESSMENT/PLAN: advised due, offered and she is doing today      Optometrist (yearly if >40) ASSESSMENT/PLAN: utd or ordered - sees oncology for management breast health/screening/survaillence      Screening Pap smear/pelvic exam (q2 years) ASSESSMENT/PLAN: n/a,  declined - did with gyn 12/2016      Colorectal cancer screening (FOBT yearly or flex sig q4y or colonoscopy q10y or barium enema q4y) ASSESSMENT/PLAN: utd, colonoscopy done 08/2018      Diabetes outpatient self-management training services ASSESSMENT/PLAN: rechecking today      Bone mass measurements(covered q2y if  indicated - estrogen def, osteoporosis, hyperparathyroid, vertebral abnormalities, osteoporosis or steroids) ASSESSMENT/PLAN: utd or discussed and ordered per pt wishes      Screening for glaucoma(q1y if high risk - diabetes, FH, AA and > 50 or hispanic and > 65) ASSESSMENT/PLAN: utd or advised      Medical nutritional therapy for individuals with diabetes or renal disease ASSESSMENT/PLAN: see orders      Cardiovascular screening blood tests (lipids q5y) ASSESSMENT/PLAN: see orders and labs      Diabetes screening tests ASSESSMENT/PLAN: see orders and labs   7.) Summary:  Medicare annual wellness visit, subsequent -risk factors and conditions per above assessment were discussed and treatment, recommendations and referrals were offered per documentation above and orders and patient instructions. -vaccines per orders -labs per orders  Left hip pain -we discussed possible serious and likely etiologies, workup and treatment, treatment risks and return precautions -after this discussion, Jacoya opted for xray here and specialist referral pending results, cont conservative prn care in interim. -of course, we advised Alga  to return or notify a doctor immediately if symptoms worsen or persist or new concerns arise.  Essential hypertension -stable, labs per orders -lifestyle recs -cont current care  Malignant neoplasm of lower-outer quadrant of left breast of female, estrogen receptor positive ([place]) -sees oncology for managment  Patient Instructions   BEFORE YOU LEAVE: -eye exam in epic -phq9 in epic -vaccines: pneumococcal 23, flu -medicare  papers -labs -xray -follow up: 3-4 months  We have ordered labs and an xray at this visit. It can take up to 1-2 weeks for results and processing. IF results require follow up or explanation, we will call you with instructions. Clinically stable results will be released to your [facility]. If you have not heard from [place] or cannot find your results in [facility] in 2 weeks please contact our office at [phone].  If you are not yet signed up for [facility], please consider signing up.  We likely will have you see the orthopedic specialist or the sports medicine specialist about the hip pain once we have the results of the xray.   Ms. Wangerin , Thank you for taking time to come for your Medicare Wellness Visit. I appreciate your ongoing commitment to your health goals. Please review the following plan we discussed and let me know if I can assist you in the future.   These are the goals we discussed: Goals   Healthy diet, continue regular exercise Vaccines updated today     This is a list of the screening recommended for you and due dates:  Health Maintenance  Topic Date Due  . Flu Shot  06/19/2018  . Pneumonia vaccines (2 of 2 - PPSV23) 11/25/2018  . DEXA scan (bone density measurement)  04/24/2019  . Mammogram  05/08/2020  . Tetanus Vaccine  08/18/2022  . Colon Cancer Screening  08/23/2023  .  Hepatitis C: One time screening is recommended by Center for Disease Control  (CDC) for  adults born from [birth year] through 1964.   Completed      Preventive Care 49 Years and Older, Female Preventive care refers to lifestyle choices and visits with your health care provider that can promote health and wellness. What does preventive care include?  A yearly physical exam. This is also called an annual well check.  Dental exams once or twice a year.  Routine eye exams. Ask your health care provider how often you should have your eyes checked.  Personal lifestyle choices, including: ?  Daily care of  your teeth and gums. ? Regular physical activity. ? Eating a healthy diet. ? Avoiding tobacco and drug use. ? Limiting alcohol use. ? Practicing safe sex. ? Taking vitamin and mineral supplements as recommended by your health care provider. What happens during an annual well check? The services and screenings done by your health care provider during your annual well check will depend on your age, overall health, lifestyle risk factors, and family history of disease. Counseling Your health care provider may ask you questions about your:  Alcohol use.  Tobacco use.  Drug use.  Emotional well-being.  Home and relationship well-being.  Sexual activity.  Eating habits.  History of falls.  Memory and ability to understand (cognition).  Work and work Statistician.  Reproductive health.  Screening You may have the following tests or measurements:  Height, weight, and BMI.  Blood pressure.  Lipid and cholesterol levels. These may be checked every 5 years, or more frequently if you are over 64 years old.  Skin check.  Lung cancer screening. You may have this screening every year starting at age 87 if you have a 30-pack-year history of smoking and currently smoke or have quit within the past 15 years.  Colorectal cancer screening. All adults should have this screening starting at age 54 and continuing until age 13. You will have tests every 1-10 years, depending on your results and the type of screening test. People at increased risk should start screening at an earlier age. Screening tests may include: ? Guaiac-based fecal occult blood testing. ? Fecal immunochemical test (FIT). ? Stool DNA test. ? Virtual colonoscopy. ? Sigmoidoscopy. During this test, a flexible tube with a tiny camera (sigmoidoscope) is used to examine your rectum and lower colon. The sigmoidoscope is inserted through your anus into your rectum and lower colon. ? Colonoscopy. During this test, a long,  thin, flexible tube with a tiny camera (colonoscope) is used to examine your entire colon and rectum.  Hepatitis C blood test.  Hepatitis B blood test.  Sexually transmitted disease (STD) testing.  Diabetes screening. This is done by checking your blood sugar (glucose) after you have not eaten for a while (fasting). You may have this done every 1-3 years.  Bone density scan. This is done to screen for osteoporosis. You may have this done starting at age 28.  Mammogram. This may be done every 1-2 years. Talk to your health care provider about how often you should have regular mammograms. Talk with your health care provider about your test results, treatment options, and if necessary, the need for more tests. Vaccines Your health care provider may recommend certain vaccines, such as:  Influenza vaccine. This is recommended every year.  Tetanus, diphtheria, and acellular pertussis (Tdap, Td) vaccine. You may need a Td booster every 10 years.  Varicella vaccine. You may need this if you have not been vaccinated.  Zoster vaccine. You may need this after age 67.  Measles, mumps, and rubella (MMR) vaccine. You may need at least one dose of MMR if you were born in 1957 or later. You may also need a second dose.  Pneumococcal 13-valent conjugate (PCV13) vaccine. One dose is recommended after age 52.  Pneumococcal polysaccharide (PPSV23) vaccine. One dose is recommended after age 68.  Meningococcal vaccine. You may need this if you have certain conditions.  Hepatitis A vaccine. You may need this if you have certain conditions or if you travel or work in places where you may  be exposed to hepatitis A.  Hepatitis B vaccine. You may need this if you have certain conditions or if you travel or work in places where you may be exposed to hepatitis B.  Haemophilus influenzae type b (Hib) vaccine. You may need this if you have certain conditions. Talk to your health care provider about which  screenings and vaccines you need and how often you need them. This information is not intended to replace advice given to you by your health care provider. Make sure you discuss any questions you have with your health care provider. Document Released: 12/02/2015 Document Revised: 12/26/2017 Document Reviewed: 09/06/2015 Elsevier Interactive Patient Education  2019 [place], DO

## 2018-12-08 ENCOUNTER — Ambulatory Visit (INDEPENDENT_AMBULATORY_CARE_PROVIDER_SITE_OTHER): Payer: 59

## 2018-12-08 ENCOUNTER — Encounter: Payer: Self-pay | Admitting: Family Medicine

## 2018-12-08 ENCOUNTER — Ambulatory Visit (INDEPENDENT_AMBULATORY_CARE_PROVIDER_SITE_OTHER): Payer: 59 | Admitting: Family Medicine

## 2018-12-08 VITALS — BP 110/78 | HR 80 | Temp 98.4°F | Ht 66.25 in | Wt 168.3 lb

## 2018-12-08 DIAGNOSIS — M25552 Pain in left hip: Secondary | ICD-10-CM

## 2018-12-08 DIAGNOSIS — M160 Bilateral primary osteoarthritis of hip: Secondary | ICD-10-CM | POA: Diagnosis not present

## 2018-12-08 DIAGNOSIS — C50512 Malignant neoplasm of lower-outer quadrant of left female breast: Secondary | ICD-10-CM

## 2018-12-08 DIAGNOSIS — Z170 Estrogen receptor positive status [ER+]: Secondary | ICD-10-CM

## 2018-12-08 DIAGNOSIS — Z23 Encounter for immunization: Secondary | ICD-10-CM | POA: Diagnosis not present

## 2018-12-08 DIAGNOSIS — I10 Essential (primary) hypertension: Secondary | ICD-10-CM | POA: Diagnosis not present

## 2018-12-08 DIAGNOSIS — R739 Hyperglycemia, unspecified: Secondary | ICD-10-CM | POA: Diagnosis not present

## 2018-12-08 DIAGNOSIS — Z0000 Encounter for general adult medical examination without abnormal findings: Secondary | ICD-10-CM

## 2018-12-08 DIAGNOSIS — M1610 Unilateral primary osteoarthritis, unspecified hip: Secondary | ICD-10-CM

## 2018-12-08 LAB — CBC
HCT: 37.9 % (ref 36.0–46.0)
Hemoglobin: 12.5 g/dL (ref 12.0–15.0)
MCHC: 33 g/dL (ref 30.0–36.0)
MCV: 88.6 fl (ref 78.0–100.0)
Platelets: 230 10*3/uL (ref 150.0–400.0)
RBC: 4.28 Mil/uL (ref 3.87–5.11)
RDW: 13.5 % (ref 11.5–15.5)
WBC: 5.6 10*3/uL (ref 4.0–10.5)

## 2018-12-08 LAB — HEMOGLOBIN A1C: Hgb A1c MFr Bld: 6.1 % (ref 4.6–6.5)

## 2018-12-08 LAB — BASIC METABOLIC PANEL
BUN: 17 mg/dL (ref 6–23)
CHLORIDE: 103 meq/L (ref 96–112)
CO2: 28 meq/L (ref 19–32)
Calcium: 9.8 mg/dL (ref 8.4–10.5)
Creatinine, Ser: 0.8 mg/dL (ref 0.40–1.20)
GFR: 86.61 mL/min (ref >60.00)
Glucose, Bld: 100 mg/dL — ABNORMAL HIGH (ref 70–99)
POTASSIUM: 4.2 meq/L (ref 3.5–5.1)
SODIUM: 140 meq/L (ref 135–145)

## 2018-12-08 NOTE — Addendum Note (Signed)
Addended by: Agnes Lawrence on: 12/08/2018 11:46 AM   Modules accepted: Orders

## 2018-12-08 NOTE — Patient Instructions (Addendum)
BEFORE YOU LEAVE: -eye exam in epic -fall risk assessment -phq9 in epic -vaccines: pneumococcal 23, flu -medicare papers -labs -xray -follow up: 3-4 months  We have ordered labs and an xray at this visit. It can take up to 1-2 weeks for results and processing. IF results require follow up or explanation, we will call you with instructions. Clinically stable results will be released to your [facility]. If you have not heard from [place] or cannot find your results in [facility] in 2 weeks please contact our office at [phone].  If you are not yet signed up for [facility], please consider signing up.  We likely will have you see the orthopedic specialist or the sports medicine specialist about the hip pain once we have the results of the xray.   Krista Armstrong , Thank you for taking time to come for your Medicare Wellness Visit. I appreciate your ongoing commitment to your health goals. Please review the following plan we discussed and let me know if I can assist you in the future.   These are the goals we discussed: Goals   Healthy diet, continue regular exercise Vaccines updated today     This is a list of the screening recommended for you and due dates:  Health Maintenance  Topic Date Due  . Flu Shot  06/19/2018  . Pneumonia vaccines (2 of 2 - PPSV23) 11/25/2018  . DEXA scan (bone density measurement)  04/24/2019  . Mammogram  05/08/2020  . Tetanus Vaccine  08/18/2022  . Colon Cancer Screening  08/23/2023  .  Hepatitis C: One time screening is recommended by Center for Disease Control  (CDC) for  adults born from [birth year] through 1964.   Completed      Preventive Care 95 Years and Older, Female Preventive care refers to lifestyle choices and visits with your health care provider that can promote health and wellness. What does preventive care include?  A yearly physical exam. This is also called an annual well check.  Dental exams once or twice a year.  Routine eye exams. Ask your health  care provider how often you should have your eyes checked.  Personal lifestyle choices, including: ? Daily care of your teeth and gums. ? Regular physical activity. ? Eating a healthy diet. ? Avoiding tobacco and drug use. ? Limiting alcohol use. ? Practicing safe sex. ? Taking vitamin and mineral supplements as recommended by your health care provider. What happens during an annual well check? The services and screenings done by your health care provider during your annual well check will depend on your age, overall health, lifestyle risk factors, and family history of disease. Counseling Your health care provider may ask you questions about your:  Alcohol use.  Tobacco use.  Drug use.  Emotional well-being.  Home and relationship well-being.  Sexual activity.  Eating habits.  History of falls.  Memory and ability to understand (cognition).  Work and work Statistician.  Reproductive health.  Screening You may have the following tests or measurements:  Height, weight, and BMI.  Blood pressure.  Lipid and cholesterol levels. These may be checked every 5 years, or more frequently if you are over 52 years old.  Skin check.  Lung cancer screening. You may have this screening every year starting at age 54 if you have a 30-pack-year history of smoking and currently smoke or have quit within the past 15 years.  Colorectal cancer screening. All adults should have this screening starting at age 66 and continuing until  age 65. You will have tests every 1-10 years, depending on your results and the type of screening test. People at increased risk should start screening at an earlier age. Screening tests may include: ? Guaiac-based fecal occult blood testing. ? Fecal immunochemical test (FIT). ? Stool DNA test. ? Virtual colonoscopy. ? Sigmoidoscopy. During this test, a flexible tube with a tiny camera (sigmoidoscope) is used to examine your rectum and lower colon. The  sigmoidoscope is inserted through your anus into your rectum and lower colon. ? Colonoscopy. During this test, a long, thin, flexible tube with a tiny camera (colonoscope) is used to examine your entire colon and rectum.  Hepatitis C blood test.  Hepatitis B blood test.  Sexually transmitted disease (STD) testing.  Diabetes screening. This is done by checking your blood sugar (glucose) after you have not eaten for a while (fasting). You may have this done every 1-3 years.  Bone density scan. This is done to screen for osteoporosis. You may have this done starting at age 74.  Mammogram. This may be done every 1-2 years. Talk to your health care provider about how often you should have regular mammograms. Talk with your health care provider about your test results, treatment options, and if necessary, the need for more tests. Vaccines Your health care provider may recommend certain vaccines, such as:  Influenza vaccine. This is recommended every year.  Tetanus, diphtheria, and acellular pertussis (Tdap, Td) vaccine. You may need a Td booster every 10 years.  Varicella vaccine. You may need this if you have not been vaccinated.  Zoster vaccine. You may need this after age 24.  Measles, mumps, and rubella (MMR) vaccine. You may need at least one dose of MMR if you were born in 1957 or later. You may also need a second dose.  Pneumococcal 13-valent conjugate (PCV13) vaccine. One dose is recommended after age 73.  Pneumococcal polysaccharide (PPSV23) vaccine. One dose is recommended after age 30.  Meningococcal vaccine. You may need this if you have certain conditions.  Hepatitis A vaccine. You may need this if you have certain conditions or if you travel or work in places where you may be exposed to hepatitis A.  Hepatitis B vaccine. You may need this if you have certain conditions or if you travel or work in places where you may be exposed to hepatitis B.  Haemophilus influenzae  type b (Hib) vaccine. You may need this if you have certain conditions. Talk to your health care provider about which screenings and vaccines you need and how often you need them. This information is not intended to replace advice given to you by your health care provider. Make sure you discuss any questions you have with your health care provider. Document Released: 12/02/2015 Document Revised: 12/26/2017 Document Reviewed: 09/06/2015 Elsevier Interactive Patient Education  2019 [employer].

## 2018-12-29 ENCOUNTER — Other Ambulatory Visit: Payer: Self-pay | Admitting: Family Medicine

## 2018-12-29 MED ORDER — AMLODIPINE BESYLATE 5 MG PO TABS: ORAL_TABLET | ORAL | 0 refills | Status: DC

## 2018-12-29 NOTE — Telephone Encounter (Signed)
Copied from [place] [id number]. Topic: Quick Communication - Rx Refill/Question >> Dec 29, 2018  5:42 PM Mcneil, Ja-Kwan wrote: Medication: amLODipine (NORVASC) 5 MG tablet  Has the patient contacted their pharmacy? yes   Preferred Pharmacy (with phone number or street name): [id number] - [place], [address] AT [address] [phone] (Phone) [phone] (Fax)  Agent: Please be advised that RX refills may take up to 3 business days. We ask that you follow-up with your pharmacy.

## 2018-12-30 ENCOUNTER — Other Ambulatory Visit: Payer: Self-pay | Admitting: *Deleted

## 2018-12-30 MED ORDER — AMLODIPINE BESYLATE 5 MG PO TABS: ORAL_TABLET | ORAL | 1 refills | Status: DC

## 2018-12-30 MED ORDER — LOSARTAN POTASSIUM 50 MG PO TABS: 50.0000 mg | ORAL_TABLET | Freq: Every day | ORAL | 1 refills | Status: DC

## 2018-12-30 NOTE — Telephone Encounter (Signed)
Rx done. 

## 2019-01-16 ENCOUNTER — Encounter: Payer: Self-pay | Admitting: Internal Medicine

## 2019-01-16 ENCOUNTER — Ambulatory Visit (INDEPENDENT_AMBULATORY_CARE_PROVIDER_SITE_OTHER): Payer: 59 | Admitting: Internal Medicine

## 2019-01-16 VITALS — BP 110/80 | HR 96 | Temp 99.1°F | Wt 170.4 lb

## 2019-01-16 DIAGNOSIS — B373 Candidiasis of vulva and vagina: Secondary | ICD-10-CM

## 2019-01-16 DIAGNOSIS — B3731 Acute candidiasis of vulva and vagina: Secondary | ICD-10-CM

## 2019-01-16 MED ORDER — FLUCONAZOLE 150 MG PO TABS: 300.0000 mg | ORAL_TABLET | Freq: Once | ORAL | 0 refills | Status: AC

## 2019-01-16 NOTE — Patient Instructions (Signed)
Nice meeting you today!  Take diflucan 2 tabs at once.  Come back to see [place] if not better in 5-7 days.   Vaginal Yeast infection, Adult  Vaginal yeast infection is a condition that causes vaginal discharge as well as soreness, swelling, and redness (inflammation) of the vagina. This is a common condition. Some women get this infection frequently. What are the causes? This condition is caused by a change in the normal balance of the yeast (candida) and bacteria that live in the vagina. This change causes an overgrowth of yeast, which causes the inflammation. What increases the risk? The condition is more likely to develop in women who:  Take antibiotic medicines.  Have diabetes.  Take birth control pills.  Are pregnant.  Douche often.  Have a weak body defense system (immune system).  Have been taking steroid medicines for a long time.  Frequently wear tight clothing. What are the signs or symptoms? Symptoms of this condition include:  White, thick, creamy vaginal discharge.  Swelling, itching, redness, and irritation of the vagina. The lips of the vagina (vulva) may be affected as well.  Pain or a burning feeling while urinating.  Pain during sex. How is this diagnosed? This condition is diagnosed based on:  Your medical history.  A physical exam.  A pelvic exam. Your health care provider will examine a sample of your vaginal discharge under a microscope. Your health care provider may send this sample for testing to confirm the diagnosis. How is this treated? This condition is treated with medicine. Medicines may be over-the-counter or prescription. You may be told to use one or more of the following:  Medicine that is taken by mouth (orally).  Medicine that is applied as a cream (topically).  Medicine that is inserted directly into the vagina (suppository). Follow these instructions at home:  Lifestyle  Do not have sex until your health care provider  approves. Tell your sex partner that you have a yeast infection. That person should go to his or her health care provider and ask if they should also be treated.  Do not wear tight clothes, such as pantyhose or tight pants.  Wear breathable cotton underwear. General instructions  Take or apply over-the-counter and prescription medicines only as told by your health care provider.  Eat more yogurt. This may help to keep your yeast infection from returning.  Do not use tampons until your health care provider approves.  Try taking a sitz bath to help with discomfort. This is a warm water bath that is taken while you are sitting down. The water should only come up to your hips and should cover your buttocks. Do this 3-4 times per day or as told by your health care provider.  Do not douche.  If you have diabetes, keep your blood sugar levels under control.  Keep all follow-up visits as told by your health care provider. This is important. Contact a health care provider if:  You have a fever.  Your symptoms go away and then return.  Your symptoms do not get better with treatment.  Your symptoms get worse.  You have new symptoms.  You develop blisters in or around your vagina.  You have blood coming from your vagina and it is not your menstrual period.  You develop pain in your abdomen. Summary  Vaginal yeast infection is a condition that causes discharge as well as soreness, swelling, and redness (inflammation) of the vagina.  This condition is treated with medicine. Medicines  may be over-the-counter or prescription.  Take or apply over-the-counter and prescription medicines only as told by your health care provider.  Do not douche. Do not have sex or use tampons until your health care provider approves.  Contact a health care provider if your symptoms do not get better with treatment or your symptoms go away and then return. This information is not intended to replace advice  given to you by your health care provider. Make sure you discuss any questions you have with your health care provider. Document Released: 08/15/2005 Document Revised: 03/24/2018 Document Reviewed: 03/24/2018 Elsevier Interactive Patient Education  2019 [employer].

## 2019-01-16 NOTE — Progress Notes (Signed)
Established Patient Office Visit     CC/Reason for Visit: vaginal itching  HPI: Krista Armstrong is a 68 y.o. female who is coming in today for the above mentioned reasons. Vaginal itching began 2 days ago. Feels just like the yeast infection she had 3 months ago. Has also noticed a thick, creamy vaginal discharge. No abdominal pain or fevers. Has been using OTC monistat without relief.   Past Medical/Surgical History: Past Medical History:  Diagnosis Date  . Breast cancer ([place]) DX 05/03/15---  oncologiist-  dr [name]--- per LOV note -- no recurrence   Stage 1A ( T1b, N0, M0), Grade 1,  ER/PR +,  invasive DCIS left outer quadrant--  05-30-2015  s/p  left partial mastectomy and snl dissection x1/  completed radiation 08-09-2015   . Family history of breast cancer   . GERD (gastroesophageal reflux disease)   . Hiatal hernia   . History of adenomatous polyp of colon    03-04-2015  tubular adenom's  . History of radiation therapy 07-07-2015  to 08-09-2015   left breast 42.72Gy @ 2.67Gy per 16 fractions and boost 10Gy @ 2Gy per 5 fractions  . 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 History of sepsis 07/21/2017   left hydronephrosis due to left ureter stone obstruction  . Hot flashes   . Hypertension   . Left ureteral stone   . Personal history of radiation therapy   . Wears glasses     Past Surgical History:  Procedure Laterality Date  . BREAST BIOPSY Left 05/03/2015   U/S Core- Benign  . BREAST BIOPSY Right    U/S Core- Benign  . BREAST LUMPECTOMY    . CHOLECYSTECTOMY OPEN  2005  . COLONOSCOPY WITH ESOPHAGOGASTRODUODENOSCOPY (EGD)  03-04-2015   dr [name]  . CYSTOSCOPY WITH RETROGRADE PYELOGRAM, URETEROSCOPY AND STENT PLACEMENT Left 07/21/2017   Procedure: CYSTOSCOPY WITH RETROGRADE PYELOGRAM, URETEROSCOPY AND STENT PLACEMENT;  Surgeon: [name], MD;  Location: [facility];  Service: Urology;  Laterality:  Left;  . CYSTOSCOPY WITH RETROGRADE PYELOGRAM, URETEROSCOPY AND STENT PLACEMENT Left 08/16/2017   Procedure: CYSTOSCOPY WITH RETROGRADE PYELOGRAM, URETEROSCOPY , LASER LITHOTRIPSY, STONE BASKETRY AND STENT REPLACEMENT;  Surgeon: [name], MD;  Location: [facility];  Service: Urology;  Laterality: Left;  . DILATATION & CURETTAGE/HYSTEROSCOPY WITH MYOSURE N/A 02/05/2017   Procedure: DILATATION & CURETTAGE/HYSTEROSCOPY WITH MYOSURE;  Surgeon: [name], MD;  Location: [facility];  Service: Gynecology;  Laterality: N/A;  . GASTRIC RESECTION  2006   wedge resection for two GIST tumors (benign)  . HOLMIUM LASER APPLICATION Left 1/76/1607   Procedure: HOLMIUM LASER APPLICATION;  Surgeon: [name], MD;  Location: [facility];  Service: Urology;  Laterality: Left;  . LUMBAR LAMINECTOMY  2000;  2009  . RADIOACTIVE SEED GUIDED PARTIAL MASTECTOMY WITH AXILLARY SENTINEL LYMPH NODE BIOPSY Left 05/30/2015   Procedure: LEFT PARTIAL MASTECTOMY WITH RADIOACTIVE SEED LOCALIZATION, LEFT AXILLARY SENTINEL LYMPH NODE BIOPSY;  Surgeon: Fanny Skates, MD;  Location: [place];  Service: General;  Laterality: Left;  . TRANSTHORACIC ECHOCARDIOGRAM  07/06/2010   normal echo; ef 70%    Social History:  reports that she has never smoked. She has never used smokeless tobacco. She reports current alcohol use of about 7.0 standard drinks of alcohol per week. She reports that she does not use drugs.  Allergies:  Allergies  Allergen Reactions  . Vicodin [Hydrocodone-Acetaminophen] Other (See Comments)    Massive headache    Family History:  Family History  Problem Relation Age of Onset  . Breast cancer Mother 44  . Stroke Father   . Heart disease Maternal Grandmother   . Cervical cancer Maternal Aunt   . Colon cancer Neg Hx   . Colon polyps Neg Hx   . Diabetes Neg Hx   . Kidney disease Neg Hx   . Esophageal cancer Neg Hx     . Gallbladder disease Neg Hx      Current Outpatient Medications:  .  amLODipine (NORVASC) 5 MG tablet, TAKE ONE TABLET BY MOUTH DAIYL, Disp: 90 tablet, Rfl: 1 .  Biotin 1000 MCG CHEW, Chew 5,000 mg by mouth daily., Disp: , Rfl:  .  esomeprazole (NEXIUM) 40 MG capsule, Take 1 capsule (40 mg total) by mouth daily., Disp: 90 capsule, Rfl: 1 .  gabapentin (NEURONTIN) 300 MG capsule, Take 2 capsules (600 mg total) by mouth at bedtime., Disp: 90 capsule, Rfl: 4 .  losartan (COZAAR) 50 MG tablet, Take 1 tablet (50 mg total) by mouth daily., Disp: 90 tablet, Rfl: 1 .  tamoxifen (NOLVADEX) 20 MG tablet, TAKE ONE TABLET BY MOUTH DAILY, Disp: 90 tablet, Rfl: 0 .  fluconazole (DIFLUCAN) 150 MG tablet, Take 2 tablets (300 mg total) by mouth once for 1 dose., Disp: 2 tablet, Rfl: 0  Current Facility-Administered Medications:  .  0.9 %  sodium chloride infusion, 500 mL, Intravenous, Once, Milus Banister, MD  Review of Systems:  Constitutional: Denies fever, chills, diaphoresis, appetite change and fatigue.  HEENT: Denies photophobia, eye pain, redness, hearing loss, ear pain, congestion, sore throat, rhinorrhea, sneezing, mouth sores, trouble swallowing, neck pain, neck stiffness and tinnitus.   Respiratory: Denies SOB, DOE, cough, chest tightness,  and wheezing.   Cardiovascular: Denies chest pain, palpitations and leg swelling.  Gastrointestinal: Denies nausea, vomiting, abdominal pain, diarrhea, constipation, blood in stool and abdominal distention.  Genitourinary: Denies dysuria, urgency, frequency, hematuria, flank pain and difficulty urinating.  Endocrine: Denies: hot or cold intolerance, sweats, changes in hair or nails, polyuria, polydipsia. Musculoskeletal: Denies myalgias, back pain, joint swelling, arthralgias and gait problem.  Skin: Denies pallor, rash and wound.  Neurological: Denies dizziness, seizures, syncope, weakness, light-headedness, numbness and headaches.  Hematological:  Denies adenopathy. Easy bruising, personal or family bleeding history  Psychiatric/Behavioral: Denies suicidal ideation, mood changes, confusion, nervousness, sleep disturbance and agitation    Physical Exam: Vitals:   01/16/19 1552  BP: 110/80  Pulse: 96  Temp: 99.1 F (37.3 C)  TempSrc: Oral  SpO2: 97%  Weight: 170 lb 6.4 oz (77.3 kg)    Body mass index is 27.3 kg/m.   Constitutional: NAD, calm, comfortable Eyes: PERRL, lids and conjunctivae normal ENMT: Mucous membranes are moist.  Abdomen: no tenderness, no masses palpated. No hepatosplenomegaly. Bowel sounds positive.  Psychiatric: Normal judgment and insight. Alert and oriented x 3. Normal mood.    Impression and Plan:  Vaginal yeast infection  -Will treat empirically with diflucan. -She knows to RTC if not improved in 5-7 days. May need pelvic then.    Patient Instructions  Nice meeting you today!  Take diflucan 2 tabs at once.  Come back to see [place] if not better in 5-7 days.   Vaginal Yeast infection, Adult  Vaginal yeast infection is a condition that causes vaginal discharge as well as soreness, swelling, and redness (inflammation) of the vagina. This  is a common condition. Some women get this infection frequently. What are the causes? This condition is caused by a change in the normal balance of the yeast (candida) and bacteria that live in the vagina. This change causes an overgrowth of yeast, which causes the inflammation. What increases the risk? The condition is more likely to develop in women who:  Take antibiotic medicines.  Have diabetes.  Take birth control pills.  Are pregnant.  Douche often.  Have a weak body defense system (immune system).  Have been taking steroid medicines for a long time.  Frequently wear tight clothing. What are the signs or symptoms? Symptoms of this condition include:  White, thick, creamy vaginal discharge.  Swelling, itching, redness, and irritation  of the vagina. The lips of the vagina (vulva) may be affected as well.  Pain or a burning feeling while urinating.  Pain during sex. How is this diagnosed? This condition is diagnosed based on:  Your medical history.  A physical exam.  A pelvic exam. Your health care provider will examine a sample of your vaginal discharge under a microscope. Your health care provider may send this sample for testing to confirm the diagnosis. How is this treated? This condition is treated with medicine. Medicines may be over-the-counter or prescription. You may be told to use one or more of the following:  Medicine that is taken by mouth (orally).  Medicine that is applied as a cream (topically).  Medicine that is inserted directly into the vagina (suppository). Follow these instructions at home:  Lifestyle  Do not have sex until your health care provider approves. Tell your sex partner that you have a yeast infection. That person should go to his or her health care provider and ask if they should also be treated.  Do not wear tight clothes, such as pantyhose or tight pants.  Wear breathable cotton underwear. General instructions  Take or apply over-the-counter and prescription medicines only as told by your health care provider.  Eat more yogurt. This may help to keep your yeast infection from returning.  Do not use tampons until your health care provider approves.  Try taking a sitz bath to help with discomfort. This is a warm water bath that is taken while you are sitting down. The water should only come up to your hips and should cover your buttocks. Do this 3-4 times per day or as told by your health care provider.  Do not douche.  If you have diabetes, keep your blood sugar levels under control.  Keep all follow-up visits as told by your health care provider. This is important. Contact a health care provider if:  You have a fever.  Your symptoms go away and then return.  Your  symptoms do not get better with treatment.  Your symptoms get worse.  You have new symptoms.  You develop blisters in or around your vagina.  You have blood coming from your vagina and it is not your menstrual period.  You develop pain in your abdomen. Summary  Vaginal yeast infection is a condition that causes discharge as well as soreness, swelling, and redness (inflammation) of the vagina.  This condition is treated with medicine. Medicines may be over-the-counter or prescription.  Take or apply over-the-counter and prescription medicines only as told by your health care provider.  Do not douche. Do not have sex or use tampons until your health care provider approves.  Contact a health care provider if your symptoms do not get better  with treatment or your symptoms go away and then return. This information is not intended to replace advice given to you by your health care provider. Make sure you discuss any questions you have with your health care provider. Document Released: 08/15/2005 Document Revised: 03/24/2018 Document Reviewed: 03/24/2018 Elsevier Interactive Patient Education  2019 [place], MD [facility]

## 2019-01-21 ENCOUNTER — Telehealth: Payer: Self-pay | Admitting: Oncology

## 2019-01-21 NOTE — Telephone Encounter (Signed)
Patient returned the call to schedule a lab and MD with the doc.  Patient aware of appt date and time.

## 2019-01-21 NOTE — Telephone Encounter (Signed)
Called patient per 2/28 VM.  Was not able to schedule and appt nor leave a voice message.

## 2019-02-11 ENCOUNTER — Telehealth: Payer: Self-pay | Admitting: *Deleted

## 2019-02-11 NOTE — Telephone Encounter (Signed)
I called the pt and she stated the reason she needs the permanent handicapped placard form she has completed due not being able walk due to back issues from 2 surgeries she had and this was completed by Dr Maudie Mercury previously and it will expire in April. Message sent to Dr Maudie Mercury.

## 2019-02-11 NOTE — Telephone Encounter (Signed)
Go ahead and complete and stamp for my signature or fax to me and I will sign. Thanks.

## 2019-02-11 NOTE — Telephone Encounter (Signed)
Copied from [place] [id number]. Topic: General - Other >> Feb 11, 2019 10:37 AM Percell Belt A wrote: Reason for CRM:   Pt called in and LM stated that she has a handicap placard that needs to be dropped of at the office and filled out.  She would like to to know if she is able to bring it by?    [phone]

## 2019-02-12 NOTE — Telephone Encounter (Signed)
I called the pt and informed her of the message below.  Patient advised to bring this in to the office for completion.

## 2019-02-24 ENCOUNTER — Telehealth: Payer: Self-pay | Admitting: Oncology

## 2019-02-24 NOTE — Telephone Encounter (Signed)
Per 4/6 schedule message cxd 4/13 lab/fu and moved to October. Not able to reach patient or leave message. Schedule mailed.

## 2019-03-02 ENCOUNTER — Ambulatory Visit: Payer: Medicare Other | Admitting: Oncology

## 2019-03-02 ENCOUNTER — Other Ambulatory Visit: Payer: Medicare Other

## 2019-03-16 ENCOUNTER — Other Ambulatory Visit: Payer: Self-pay | Admitting: Family Medicine

## 2019-03-16 DIAGNOSIS — Z853 Personal history of malignant neoplasm of breast: Secondary | ICD-10-CM

## 2019-03-27 ENCOUNTER — Other Ambulatory Visit: Payer: Self-pay | Admitting: Family Medicine

## 2019-04-09 ENCOUNTER — Encounter: Payer: Self-pay | Admitting: Family Medicine

## 2019-04-09 ENCOUNTER — Telehealth: Payer: Self-pay | Admitting: Family Medicine

## 2019-04-09 ENCOUNTER — Other Ambulatory Visit: Payer: Self-pay

## 2019-04-09 ENCOUNTER — Telehealth: Payer: Self-pay | Admitting: *Deleted

## 2019-04-09 ENCOUNTER — Ambulatory Visit (INDEPENDENT_AMBULATORY_CARE_PROVIDER_SITE_OTHER): Payer: 59 | Admitting: Family Medicine

## 2019-04-09 VITALS — Temp 98.3°F

## 2019-04-09 DIAGNOSIS — B3731 Acute candidiasis of vulva and vagina: Secondary | ICD-10-CM

## 2019-04-09 DIAGNOSIS — B373 Candidiasis of vulva and vagina: Secondary | ICD-10-CM

## 2019-04-09 MED ORDER — FLUCONAZOLE 150 MG PO TABS: 150.0000 mg | ORAL_TABLET | Freq: Once | ORAL | 0 refills | Status: AC

## 2019-04-09 NOTE — Progress Notes (Signed)
Virtual Visit via Video Note  I connected with Krista Armstrong  on 04/09/19 at 10:00 AM EDT by a video enabled telemedicine application and verified that I am speaking with the correct person using two identifiers.  Location patient: home Location provider:work or home office Persons participating in the virtual visit: patient, provider  HPI:  Acute visit for "yeast infection": -started a few days ago -has had yeast vaginitis in the past and this feels the same - thick white curdy vaginal discharge and pruritis -reports since taking tamoxifen has been more of an issues -reports always resolves with diflucan x 1 -denies: green or yellow discharge, new sexual partner or risk for STI, pelvic or abd pain, NV, dysuria, fevers, vag bleeding or bad odor -tried monistat which did not help   ROS: See pertinent positives and negatives per HPI.  Past Medical History:  Diagnosis Date  . Breast cancer ([place]) DX 05/03/15---  oncologiist-  dr [name]--- per LOV note -- no recurrence   Stage 1A ( T1b, N0, M0), Grade 1,  ER/PR +,  invasive DCIS left outer quadrant--  05-30-2015  s/p  left partial mastectomy and snl dissection x1/  completed radiation 08-09-2015   . Family history of breast cancer   . GERD (gastroesophageal reflux disease)   . Hiatal hernia   . History of adenomatous polyp of colon    03-04-2015  tubular adenom's  . History of radiation therapy 07-07-2015  to 08-09-2015   left breast 42.72Gy @ 2.67Gy per 16 fractions and boost 10Gy @ 2Gy per 5 fractions  . 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 History of sepsis 07/21/2017   left hydronephrosis due to left ureter stone obstruction  . Hot flashes   . Hypertension   . Left ureteral stone   . Personal history of radiation therapy   . Wears glasses     Past Surgical History:  Procedure Laterality Date  . BREAST BIOPSY Left 05/03/2015   U/S Core- Benign  . BREAST BIOPSY  Right    U/S Core- Benign  . BREAST LUMPECTOMY    . CHOLECYSTECTOMY OPEN  2005  . COLONOSCOPY WITH ESOPHAGOGASTRODUODENOSCOPY (EGD)  03-04-2015   dr [name]  . CYSTOSCOPY WITH RETROGRADE PYELOGRAM, URETEROSCOPY AND STENT PLACEMENT Left 07/21/2017   Procedure: CYSTOSCOPY WITH RETROGRADE PYELOGRAM, URETEROSCOPY AND STENT PLACEMENT;  Surgeon: [name], MD;  Location: [facility];  Service: Urology;  Laterality: Left;  . CYSTOSCOPY WITH RETROGRADE PYELOGRAM, URETEROSCOPY AND STENT PLACEMENT Left 08/16/2017   Procedure: CYSTOSCOPY WITH RETROGRADE PYELOGRAM, URETEROSCOPY , LASER LITHOTRIPSY, STONE BASKETRY AND STENT REPLACEMENT;  Surgeon: [name], MD;  Location[facility];  Service: Urology;  Laterality: Left;  . DILATATION & CURETTAGE/HYSTEROSCOPY WITH MYOSURE N/A 02/05/2017   Procedure: DILATATION & CURETTAGE/HYSTEROSCOPY WITH MYOSURE;  Surgeon: [name], MD;  Location: [facility];  Service: Gynecology;  Laterality: N/A;  . GASTRIC RESECTION  2006   wedge resection for two GIST tumors (benign)  . HOLMIUM LASER APPLICATION Left 8/41/6606   Procedure: HOLMIUM LASER APPLICATION;  Surgeon: [name], MD;  Location: [facility];  Service: Urology;  Laterality: Left;  . LUMBAR LAMINECTOMY  2000;  2009  . RADIOACTIVE SEED GUIDED PARTIAL MASTECTOMY WITH AXILLARY SENTINEL LYMPH NODE BIOPSY Left 05/30/2015   Procedure: LEFT PARTIAL MASTECTOMY WITH RADIOACTIVE SEED LOCALIZATION, LEFT AXILLARY SENTINEL LYMPH NODE BIOPSY;  Surgeon: Fanny Skates,  MD;  Location: [place];  Service: General;  Laterality: Left;  . TRANSTHORACIC ECHOCARDIOGRAM  07/06/2010   normal echo; ef 70%    Family History  Problem Relation Age of Onset  . Breast cancer Mother 38  . Stroke Father   . Heart disease Maternal Grandmother   . Cervical cancer Maternal Aunt   . Colon cancer Neg Hx   . Colon polyps Neg Hx   . Diabetes Neg Hx    . Kidney disease Neg Hx   . Esophageal cancer Neg Hx   . Gallbladder disease Neg Hx     SOCIAL HX: see hpi   Current Outpatient Medications:  .  amLODipine (NORVASC) 5 MG tablet, TAKE 1 TABLET BY MOUTH DAILY, Disp: 90 tablet, Rfl: 0 .  Biotin 1000 MCG CHEW, Chew 5,000 mg by mouth daily., Disp: , Rfl:  .  esomeprazole (NEXIUM) 40 MG capsule, Take 1 capsule (40 mg total) by mouth daily., Disp: 90 capsule, Rfl: 1 .  gabapentin (NEURONTIN) 300 MG capsule, Take 2 capsules (600 mg total) by mouth at bedtime., Disp: 90 capsule, Rfl: 4 .  losartan (COZAAR) 50 MG tablet, Take 1 tablet (50 mg total) by mouth daily., Disp: 90 tablet, Rfl: 1 .  tamoxifen (NOLVADEX) 20 MG tablet, TAKE ONE TABLET BY MOUTH DAILY, Disp: 90 tablet, Rfl: 0 .  fluconazole (DIFLUCAN) 150 MG tablet, Take 1 tablet (150 mg total) by mouth once for 1 dose. May repeat in 3 days if needed., Disp: 2 tablet, Rfl: 0  Current Facility-Administered Medications:  .  0.9 %  sodium chloride infusion, 500 mL, Intravenous, Once, Milus Banister, MD  EXAM:  VITALS per patient if applicable:  GENERAL: alert, oriented, appears well and in no acute distress  HEENT: atraumatic, conjunttiva clear, no obvious abnormalities on inspection of external nose and ears  NECK: normal movements of the head and neck  LUNGS: on inspection no signs of respiratory distress, breathing rate appears normal, no obvious gross SOB, gasping or wheezing  CV: no obvious cyanosis  ABD: denies abd TTP  MS: moves all visible extremities without noticeable abnormality  PSYCH/NEURO: pleasant and cooperative, no obvious depression or anxiety, speech and thought processing grossly intact  ASSESSMENT AND PLAN:  Discussed the following assessment and plan:  Yeast vaginitis  Opted for treatment with Diflucan after discussion potential etiologies, risks, return precautions. Advised low sugar healthy diet. Probiotic for 2 months. Follow up 3 months with  labs. In office visit with Dr. Ethlyn Gallery if this is not resolved next week for exam and vaginal swab.   I discussed the assessment and treatment plan with the patient. The patient was provided an opportunity to ask questions and all were answered. The patient agreed with the plan and demonstrated an understanding of the instructions.   The patient was advised to call back or seek an in-person evaluation if the symptoms worsen or if the condition fails to improve as anticipated.   Follow up instructions: Advised assistant Wendie Simmer to help patient arrange the following: -follow up Dr. Maudie Mercury 3 months Lucretia Kern, DO

## 2019-04-09 NOTE — Telephone Encounter (Signed)
-----   Message from Lucretia Kern, DO sent at 04/09/2019 10:11 AM EDT ----- -follow up Dr. Maudie Mercury 3 months

## 2019-04-09 NOTE — Telephone Encounter (Signed)
error 

## 2019-04-09 NOTE — Telephone Encounter (Signed)
I called the pt and informed her of the message below.  Patient stated she will call back for the appt due to her job at this time.

## 2019-05-11 ENCOUNTER — Other Ambulatory Visit: Payer: Self-pay | Admitting: Family Medicine

## 2019-05-11 ENCOUNTER — Ambulatory Visit
Admission: RE | Admit: 2019-05-11 | Discharge: 2019-05-11 | Disposition: A | Discharge status: Home / Self Care | Payer: 59 | Source: Ambulatory Visit | Attending: Family Medicine | Admitting: Family Medicine

## 2019-05-11 ENCOUNTER — Other Ambulatory Visit: Payer: Self-pay

## 2019-05-11 DIAGNOSIS — Z853 Personal history of malignant neoplasm of breast: Secondary | ICD-10-CM

## 2019-06-19 ENCOUNTER — Telehealth: Payer: Self-pay | Admitting: Oncology

## 2019-06-19 ENCOUNTER — Telehealth: Payer: Self-pay | Admitting: *Deleted

## 2019-06-19 NOTE — Telephone Encounter (Signed)
Pt left message on nurse VM stating need to reschedule appointment for Oct to August due to she will losing her job 07/20/2019 and will be uninsured.  She will then be relocating to the [facility] area in October 2020.  Krista Armstrong states she has left several VM with scheduling requesting rescheduling.  Return call given as [phone].  This RN sent a high priority appointment request per above.

## 2019-06-19 NOTE — Telephone Encounter (Signed)
Called pt's number twice - no answer and unable to leave a message. Called spouse and he said he will let her know to call back to reschedule.

## 2019-06-22 NOTE — Telephone Encounter (Signed)
Pt called in later on 7/31 and rescheduled . Pt aware of appt 8/20 date and time.

## 2019-06-29 ENCOUNTER — Other Ambulatory Visit: Payer: Self-pay

## 2019-06-29 ENCOUNTER — Other Ambulatory Visit (INDEPENDENT_AMBULATORY_CARE_PROVIDER_SITE_OTHER): Payer: 59

## 2019-06-29 ENCOUNTER — Telehealth (INDEPENDENT_AMBULATORY_CARE_PROVIDER_SITE_OTHER): Payer: 59 | Admitting: Family Medicine

## 2019-06-29 DIAGNOSIS — R300 Dysuria: Secondary | ICD-10-CM | POA: Diagnosis not present

## 2019-06-29 DIAGNOSIS — I10 Essential (primary) hypertension: Secondary | ICD-10-CM | POA: Diagnosis not present

## 2019-06-29 DIAGNOSIS — K219 Gastro-esophageal reflux disease without esophagitis: Secondary | ICD-10-CM | POA: Diagnosis not present

## 2019-06-29 DIAGNOSIS — Z170 Estrogen receptor positive status [ER+]: Secondary | ICD-10-CM | POA: Diagnosis not present

## 2019-06-29 DIAGNOSIS — M545 Low back pain, unspecified: Secondary | ICD-10-CM

## 2019-06-29 DIAGNOSIS — R29898 Other symptoms and signs involving the musculoskeletal system: Secondary | ICD-10-CM | POA: Diagnosis not present

## 2019-06-29 DIAGNOSIS — C50512 Malignant neoplasm of lower-outer quadrant of left female breast: Secondary | ICD-10-CM

## 2019-06-29 LAB — COMPREHENSIVE METABOLIC PANEL
ALT: 16 U/L (ref 0–35)
AST: 14 U/L (ref 0–37)
Albumin: 4.1 g/dL (ref 3.5–5.2)
Alkaline Phosphatase: 40 U/L (ref 39–117)
BUN: 16 mg/dL (ref 6–23)
CO2: 24 mEq/L (ref 19–32)
Calcium: 9.2 mg/dL (ref 8.4–10.5)
Chloride: 105 mEq/L (ref 96–112)
Creatinine, Ser: 0.69 mg/dL (ref 0.40–1.20)
GFR: 102.56 mL/min (ref >60.00)
Glucose, Bld: 88 mg/dL (ref 70–99)
Potassium: 4 mEq/L (ref 3.5–5.1)
Sodium: 138 mEq/L (ref 135–145)
Total Bilirubin: 0.4 mg/dL (ref 0.2–1.2)
Total Protein: 6.9 g/dL (ref 6.0–8.3)

## 2019-06-29 LAB — CBC WITH DIFFERENTIAL/PLATELET
Basophils Absolute: 0.1 10*3/uL (ref 0.0–0.1)
Basophils Relative: 0.9 % (ref 0.0–3.0)
Eosinophils Absolute: 0.1 10*3/uL (ref 0.0–0.7)
Eosinophils Relative: 1.9 % (ref 0.0–5.0)
HCT: 34.6 % — ABNORMAL LOW (ref 36.0–46.0)
Hemoglobin: 11.5 g/dL — ABNORMAL LOW (ref 12.0–15.0)
Lymphocytes Relative: 31.4 % (ref 12.0–46.0)
Lymphs Abs: 2.1 10*3/uL (ref 0.7–4.0)
MCHC: 33.2 g/dL (ref 30.0–36.0)
MCV: 87.6 fl (ref 78.0–100.0)
Monocytes Absolute: 0.4 10*3/uL (ref 0.1–1.0)
Monocytes Relative: 6.5 % (ref 3.0–12.0)
Neutro Abs: 4 10*3/uL (ref 1.4–7.7)
Neutrophils Relative %: 59.3 % (ref 43.0–77.0)
Platelets: 248 10*3/uL (ref 150.0–400.0)
RBC: 3.95 Mil/uL (ref 3.87–5.11)
RDW: 13.8 % (ref 11.5–15.5)
WBC: 6.8 10*3/uL (ref 4.0–10.5)

## 2019-06-29 LAB — POC URINALSYSI DIPSTICK (AUTOMATED)
Bilirubin, UA: NEGATIVE
Blood, UA: NEGATIVE
Glucose, UA: NEGATIVE
Ketones, UA: NEGATIVE
Protein, UA: NEGATIVE
Spec Grav, UA: 1.015 (ref 1.010–1.025)
Urobilinogen, UA: 0.2 E.U./dL
pH, UA: 6 (ref 5.0–8.0)

## 2019-06-29 MED ORDER — SULFAMETHOXAZOLE-TRIMETHOPRIM 800-160 MG PO TABS: 1.0000 | ORAL_TABLET | Freq: Two times a day (BID) | ORAL | 0 refills | Status: AC

## 2019-06-29 NOTE — Progress Notes (Signed)
Virtual Visit via Video Note  I connected with Krista Armstrong (pronounced "Roz-ma") on 30-Jun-2019 at 11:30 AM EDT by a video enabled telemedicine application 2/2 [id number] pandemic and verified that I am speaking with the correct person using two identifiers.  Location patient: home Location provider:work or home office Persons participating in the virtual visit: patient, provider  I discussed the limitations of evaluation and management by telemedicine and the availability of in person appointments. The patient expressed understanding and agreed to proceed.   HPI: Pt is a 68 yo female with pmh sig for h/o of L breast cancer, GERD, HTN, h/o ruptured disc lumbar spine, h/o renal calculi who is being seen for acute concern and TOC, previously seen by Dr. Maudie Mercury.  Acute concern -R sided, low back pain x 1 wk.  -Pt with h/o 2 ruptured disc s/p back surgery in [place].   -Pain is a 9/10 -does not feel the sensation when laying down.   -Having a burning sensation in R side of back and R leg weakness -pt limited in distance can walk 2/2 pain/leg weakness.  Using the motorized scooter if has to go grocery shopping. -also with burning when urinates.   -Pt denies any lifting, fever, chills, n/v, loss of bowel or bladder. -Not drinking any water.  -Pt had a kidney stone removed 1 yr ago.  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  Done in [place]. -endorses daily heart burn symptoms  Breast Cancer -dx'd 2015/16 -s/p L lumpectomy and radiation -sees Oncology, Dr. [name].  Has upcoming appt -on tomoxifen  Allergies: vicodin- feels weird in head.  Has taken other opioid meds without issue.  ROS: See pertinent positives and negatives per HPI.  Past Medical History:  Diagnosis Date  . Breast cancer ([place]) DX 05/03/15---  oncologiist-  dr [name]--- per LOV note -- no recurrence   Stage 1A ( T1b, N0, M0), Grade 1,  ER/PR +,  invasive DCIS left outer quadrant--   05-30-2015  s/p  left partial mastectomy and snl dissection x1/  completed radiation 08-09-2015   . Family history of breast cancer   . GERD (gastroesophageal reflux disease)   . Hiatal hernia   . History of adenomatous polyp of colon    03-04-2015  tubular adenom's  . History of radiation therapy 07-07-2015  to 08-09-2015   left breast 42.72Gy @ 2.67Gy per 16 fractions and boost 10Gy @ 2Gy per 5 fractions  . 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 History of sepsis 07/21/2017   left hydronephrosis due to left ureter stone obstruction  . Hot flashes   . Hypertension   . Left ureteral stone   . Personal history of radiation therapy   . Wears glasses     Past Surgical History:  Procedure Laterality Date  . BREAST BIOPSY Left 05/03/2015   U/S Core- Benign  . BREAST BIOPSY Right    U/S Core- Benign  . BREAST LUMPECTOMY Left   . CHOLECYSTECTOMY OPEN  2005  . COLONOSCOPY WITH ESOPHAGOGASTRODUODENOSCOPY (EGD)  03-04-2015   dr [name]  . CYSTOSCOPY WITH RETROGRADE PYELOGRAM, URETEROSCOPY AND STENT PLACEMENT Left 07/21/2017   Procedure: CYSTOSCOPY WITH RETROGRADE PYELOGRAM, URETEROSCOPY AND STENT PLACEMENT;  Surgeon: [name], MD;  Location: [facility];  Service: Urology;  Laterality: Left;  . CYSTOSCOPY WITH RETROGRADE PYELOGRAM, URETEROSCOPY AND STENT PLACEMENT Left 08/16/2017   Procedure: CYSTOSCOPY WITH  RETROGRADE PYELOGRAM, URETEROSCOPY , LASER LITHOTRIPSY, STONE BASKETRY AND STENT REPLACEMENT;  Surgeon: [name], MD;  [facility];  Service: Urology;  Laterality: Left;  . DILATATION & CURETTAGE/HYSTEROSCOPY WITH MYOSURE N/A 02/05/2017   Procedure: DILATATION & CURETTAGE/HYSTEROSCOPY WITH MYOSURE;  Surgeon: [name], MD;  Location[facility];  Service: Gynecology;  Laterality: N/A;  . GASTRIC RESECTION  2006   wedge resection for two GIST tumors (benign)  .  HOLMIUM LASER APPLICATION Left 3/50/0938   Procedure: HOLMIUM LASER APPLICATION;  Surgeon: [name], MD;  [facility];  Service: Urology;  Laterality: Left;  . LUMBAR LAMINECTOMY  2000;  2009  . RADIOACTIVE SEED GUIDED PARTIAL MASTECTOMY WITH AXILLARY SENTINEL LYMPH NODE BIOPSY Left 05/30/2015   Procedure: LEFT PARTIAL MASTECTOMY WITH RADIOACTIVE SEED LOCALIZATION, LEFT AXILLARY SENTINEL LYMPH NODE BIOPSY;  Surgeon: Fanny Skates, MD;  Location: [place];  Service: General;  Laterality: Left;  . TRANSTHORACIC ECHOCARDIOGRAM  07/06/2010   normal echo; ef 70%    Family History  Problem Relation Age of Onset  . Breast cancer Mother 77  . Stroke Father   . Heart disease Maternal Grandmother   . Cervical cancer Maternal Aunt   . Colon cancer Neg Hx   . Colon polyps Neg Hx   . Diabetes Neg Hx   . Kidney disease Neg Hx   . Esophageal cancer Neg Hx   . Gallbladder disease Neg Hx      Current Outpatient Medications:  .  amLODipine (NORVASC) 5 MG tablet, TAKE 1 TABLET BY MOUTH DAILY, Disp: 90 tablet, Rfl: 0 .  Biotin 1000 MCG CHEW, Chew 5,000 mg by mouth daily., Disp: , Rfl:  .  esomeprazole (NEXIUM) 40 MG capsule, Take 1 capsule (40 mg total) by mouth daily., Disp: 90 capsule, Rfl: 1 .  gabapentin (NEURONTIN) 300 MG capsule, Take 2 capsules (600 mg total) by mouth at bedtime., Disp: 90 capsule, Rfl: 4 .  losartan (COZAAR) 50 MG tablet, Take 1 tablet (50 mg total) by mouth daily., Disp: 90 tablet, Rfl: 1 .  tamoxifen (NOLVADEX) 20 MG tablet, TAKE ONE TABLET BY MOUTH DAILY, Disp: 90 tablet, Rfl: 0  Current Facility-Administered Medications:  .  0.9 %  sodium chloride infusion, 500 mL, Intravenous, Once, Milus Banister, MD  EXAM:  VITALS per patient if applicable: RR between 18-29 bpm  GENERAL: alert, oriented, appears well and in no acute distress  HEENT: atraumatic, conjunctiva clear, no obvious abnormalities on inspection of  external nose and ears  NECK: normal movements of the head and neck  LUNGS: on inspection no signs of respiratory distress, breathing rate appears normal, no obvious gross SOB, gasping or wheezing  CV: no obvious cyanosis  MS: moves all visible extremities without noticeable abnormality  PSYCH/NEURO: pleasant and cooperative, no obvious depression or anxiety, speech and thought processing grossly intact  ASSESSMENT AND PLAN:  Discussed the following assessment and plan:  Dysuria  - Plan: POCT urinalysis dipstick --UA with 1+ leuks, 1+ nitrate, slightly cloudy.  Will order UCx. Will start bactrim.  Acute right-sided low back pain without sciatica  -discussed possible causes including UTI, pyelonephritis, renal calculi, or MSK injury including bulging disc, neuropathy - Plan: CBC with Differential/Platelet, Comprehensive metabolic panel  Right leg weakness -no red flag symptoms -will obtain UA -discussed obtaining imaging based on lab results -given precautions for worsened symptoms  Gastroesophageal reflux disease, esophagitis presence not specified  -continue Nexium BID  Malignant neoplasm of lower-outer quadrant of left breast of female, estrogen receptor positive ([place])  -in remission -continue f/u with Oncology    I discussed the assessment and treatment plan with the patient. The patient was provided an opportunity to ask questions and all were answered. The patient agreed with the plan and demonstrated an understanding of the instructions.   The patient was advised to call back or seek an in-person evaluation if the symptoms worsen or if the condition fails to improve as anticipated.   Billie Ruddy, MD

## 2019-07-01 ENCOUNTER — Other Ambulatory Visit: Payer: Self-pay | Admitting: Oncology

## 2019-07-01 LAB — URINE CULTURE
MICRO NUMBER:: 755143
SPECIMEN QUALITY:: ADEQUATE

## 2019-07-02 ENCOUNTER — Telehealth: Payer: Self-pay | Admitting: Family Medicine

## 2019-07-02 NOTE — Telephone Encounter (Signed)
Pt stated she is still having back pain and want to know her next step

## 2019-07-02 NOTE — Telephone Encounter (Signed)
See note

## 2019-07-03 ENCOUNTER — Telehealth: Payer: 59 | Admitting: Family Medicine

## 2019-07-03 ENCOUNTER — Other Ambulatory Visit: Payer: Self-pay

## 2019-07-03 NOTE — Telephone Encounter (Signed)
Patient has an appointment today at 3:30 PM

## 2019-07-06 ENCOUNTER — Telehealth: Payer: Self-pay | Admitting: *Deleted

## 2019-07-06 NOTE — Telephone Encounter (Signed)
Copied from [place] [phone]. Topic: General - Other >> Jul 06, 2019  8:41 AM Celene Kras A wrote: Reason for CRM: Pt called stating her virtual visit on Friday she was not called. Pt states she is still in pain and that she finished the medication that was given to her. Please advise.  Spoke with patient. Informed patient per Dr. Volanda Napoleon she will need to contact ortho since her back pain is increasing. Patient verbalized understanding.

## 2019-07-08 ENCOUNTER — Other Ambulatory Visit: Payer: Self-pay | Admitting: *Deleted

## 2019-07-08 DIAGNOSIS — C50512 Malignant neoplasm of lower-outer quadrant of left female breast: Secondary | ICD-10-CM

## 2019-07-08 NOTE — Progress Notes (Signed)
[name]  [phone] [phone]     ID: Krista Armstrong DOB: [birth year]  MR#: [id number]  FOY#:[id number]  Patient Care Team: Billie Ruddy, MD as PCP - General (Family Medicine) Fanny Skates, MD as Consulting Physician (General Surgery) Magrinat, Virgie Dad, MD as Consulting Physician (Oncology) Thea Silversmith, MD as Consulting Physician (Radiation Oncology) Rockwell Germany, RN as Registered Nurse Mauro Kaufmann, RN as Registered Nurse Holley Bouche, NP (Inactive) as Nurse Practitioner (Nurse Practitioner) Sylvan Cheese, NP as Nurse Practitioner (Nurse Practitioner) Berle Mull, MD as Consulting Physician (Sports Medicine) McKenzie, Candee Furbish, MD as Consulting Physician (Urology) PCP: Billie Ruddy, MD OTHER MD: Owens Loffler MD   CHIEF COMPLAINT: estrogen receptor positive breast cancer  CURRENT TREATMENT: tamoxifen   BREAST CANCER HISTORY: From the original intake note:  Krista Armstrong (pronounced "ROZ-mah") had routine screening mammography at the breast Center 04/12/2015, showing a possible asymmetry in the left breast. Bilateral diagnostic mammography with tomosynthesis the left breast ultrasonography 04/26/2015, showed the breast density to be category B. In the left breast there was a persistent area of focal asymmetry in the lower outer quadrant. On physical exam there was a 1 cm firm nodular area at 3:30 o'clock 7 cm from the nipple. Ultrasound confirmed an irregular hypoechoic mass measuring 7 mm corresponding to the palpable abnormality. There were no abnormal appearing axillary lymph nodes.  Biopsy of the left breast mass in question 05/03/2015 showed ([id number]) and invasive ductal carcinoma, grade 1, estrogen receptor 95% positive, progesterone receptor 1% positive, both with strong staining intensity, with an MIB-1 of 5%, and no HER-2 amplification, the signals ratio being 1.43 and the number per cell 2.50.   The patient's subsequent history is as detailed below.   INTERVAL HISTORY: [name] returns today for follow-up and treatment of her estrogen receptor positive breast cancer. She was last seen on 06/26/2018.   She continues on tamoxifen.  She tolerates this remarkably well, with no side effects that she is aware of.  Since her last visit here, she underwent a digital diagnostic bilateral mammogram with tomography and a right breast ultrasound on 05/11/2019 showing: Breast Density Category C.  Stable postlumpectomy changes left breast. Within the right breast anterior depth slightly medially there is a new oval mass. No additional masses, calcifications or distortion identified within either breast. Targeted ultrasound is performed, showing a 7 x 4 x 4 mm cyst right breast 12 o'clock position 2 cm from the nipple.  REVIEW OF SYSTEMS: Krista Armstrong continues to work for [employer], but has been for load since March.  She plans to retire at the end of this month.  She and her husband are planning to move to [place], where his family lives.  She walks 3 times a day, about 5 or 6 blocks at a time, with her dogs.  A detailed review of systems today was otherwise noncontributory   PAST MEDICAL HISTORY: Past Medical History:  Diagnosis Date  . Breast cancer ([place]) DX 05/03/15---  oncologiist-  dr [name]--- per LOV note -- no recurrence   Stage 1A ( T1b, N0, M0), Grade 1,  ER/PR +,  invasive DCIS left outer quadrant--  05-30-2015  s/p  left partial mastectomy and snl dissection x1/  completed radiation 08-09-2015   . Family history of breast cancer   . GERD (gastroesophageal reflux disease)   . Hiatal hernia   . History of adenomatous polyp of colon    03-04-2015  tubular adenom's  .  History of radiation therapy 07-07-2015  to 08-09-2015   left breast 42.72Gy @ 2.67Gy per 16 fractions and boost 10Gy @ 2Gy per 5 fractions  . History of resection of stomach 2006   for GIST x2 small tumors s/p  wedge rection in  2006  (benign)  . History of sepsis 07/21/2017   left hydronephrosis due to left ureter stone obstruction  . Hot flashes   . Hypertension   . Left ureteral stone   . Personal history of radiation therapy   . Wears glasses     PAST SURGICAL HISTORY: Past Surgical History:  Procedure Laterality Date  . BREAST BIOPSY Left 05/03/2015   U/S Core- Benign  . BREAST BIOPSY Right    U/S Core- Benign  . BREAST LUMPECTOMY Left   . CHOLECYSTECTOMY OPEN  2005  . COLONOSCOPY WITH ESOPHAGOGASTRODUODENOSCOPY (EGD)  03-04-2015   dr [name]  . CYSTOSCOPY WITH RETROGRADE PYELOGRAM, URETEROSCOPY AND STENT PLACEMENT Left 07/21/2017   Procedure: CYSTOSCOPY WITH RETROGRADE PYELOGRAM, URETEROSCOPY AND STENT PLACEMENT;  Surgeon: [name], MD;  Location: [facility];  Service: Urology;  Laterality: Left;  . CYSTOSCOPY WITH RETROGRADE PYELOGRAM, URETEROSCOPY AND STENT PLACEMENT Left 08/16/2017   Procedure: CYSTOSCOPY WITH RETROGRADE PYELOGRAM, URETEROSCOPY , LASER LITHOTRIPSY, STONE BASKETRY AND STENT REPLACEMENT;  Surgeon: [name], MD;  Location: [facility];  Service: Urology;  Laterality: Left;  . DILATATION & CURETTAGE/HYSTEROSCOPY WITH MYOSURE N/A 02/05/2017   Procedure: DILATATION & CURETTAGE/HYSTEROSCOPY WITH MYOSURE;  Surgeon: [name], MD;  [facility];  Service: Gynecology;  Laterality: N/A;  . GASTRIC RESECTION  2006   wedge resection for two GIST tumors (benign)  . HOLMIUM LASER APPLICATION Left 9/60/4540   Procedure: HOLMIUM LASER APPLICATION;  Surgeon: [name], MD;  Location: [facility];  Service: Urology;  Laterality: Left;  . LUMBAR LAMINECTOMY  2000;  2009  . RADIOACTIVE SEED GUIDED PARTIAL MASTECTOMY WITH AXILLARY SENTINEL LYMPH NODE BIOPSY Left 05/30/2015   Procedure: LEFT PARTIAL MASTECTOMY WITH RADIOACTIVE SEED LOCALIZATION, LEFT AXILLARY SENTINEL LYMPH NODE BIOPSY;  Surgeon: Fanny Skates, MD;   Location: [facility];  Service: General;  Laterality: Left;  . TRANSTHORACIC ECHOCARDIOGRAM  07/06/2010   normal echo; ef 70%    FAMILY HISTORY Family History  Problem Relation Age of Onset  . Breast cancer Mother 78  . Stroke Father   . Heart disease Maternal Grandmother   . Cervical cancer Maternal Aunt   . Colon cancer Neg Hx   . Colon polyps Neg Hx   . Diabetes Neg Hx   . Kidney disease Neg Hx   . Esophageal cancer Neg Hx   . Gallbladder disease Neg Hx   the patient's father died at the age of 10 from a ruptured aortic aneurysm. The patient's mother was diagnosed with breast cancer at the age of 52 and died within a year from that problem. The patient had no brothers, one sister. There is no other history of breast or ovarian cancer in the family.  GYNECOLOGIC HISTORY:  No LMP recorded. Patient is postmenopausal. Menarche age 24, first live birth age 54. The patient is GX P3. She stopped having periods in the late 1990s. She did not take hormone replacement. She did take oral contraceptives remotely for approximately 10 years with no complications.  SOCIAL HISTORY: (Updated August 2020). 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 She Has remarried, her husband is a Administrator and he is  gone 2 or 3 weeks out of every month.  Her daughter [name] works as an Web designer and is studying to be a Pharmacist, hospital.  Her husband works for [employer].  The patient's 2 other children are [name], who lives in [facility] and works as a Futures trader, who lives in [place] and works as an Web designer for an [employer]. The patient has 4 grandchildren. She attends [employer].     ADVANCED DIRECTIVES: Not in place  HEALTH MAINTENANCE: Social History   Tobacco Use  . Smoking status: Never Smoker  . Smokeless tobacco: Never Used  Substance Use Topics  . Alcohol use:  Yes    Alcohol/week: 7.0 standard drinks    Types: 7 Glasses of wine per week    Comment: wine every  night   . Drug use: No     Colonoscopy:03/16/2015  PAP:  Bone density:never  Lipid panel:  Allergies  Allergen Reactions  . Vicodin [Hydrocodone-Acetaminophen] Other (See Comments)    Massive headache    Current Outpatient Medications  Medication Sig Dispense Refill  . amLODipine (NORVASC) 5 MG tablet TAKE 1 TABLET BY MOUTH DAILY 90 tablet 0  . Biotin 1000 MCG CHEW Chew 5,000 mg by mouth daily.    Marland Kitchen esomeprazole (NEXIUM) 40 MG capsule Take 1 capsule (40 mg total) by mouth daily. 90 capsule 1  . gabapentin (NEURONTIN) 300 MG capsule Take 2 capsules (600 mg total) by mouth at bedtime. 90 capsule 4  . losartan (COZAAR) 50 MG tablet Take 1 tablet (50 mg total) by mouth daily. 90 tablet 1  . tamoxifen (NOLVADEX) 20 MG tablet Take 1 tablet (20 mg total) by mouth daily. 90 tablet 4   Current Facility-Administered Medications  Medication Dose Route Frequency Provider Last Rate Last Dose  . 0.9 %  sodium chloride infusion  500 mL Intravenous Once Milus Banister, MD        OBJECTIVE: middle-aged African-American woman who appears well Vitals:   07/09/19 1611  BP: 133/72  Pulse: 72  Resp: 18  Temp: 98.9 F (37.2 C)  SpO2: 98%    Body mass index is 26.88 kg/m.     ECOG FS:0 - Asymptomatic  Sclerae unicteric, EOMs intact Wearing a mask No cervical or supraclavicular adenopathy Lungs no rales or rhonchi Heart regular rate and rhythm Abd soft, nontender, positive bowel sounds MSK no focal spinal tenderness, no upper extremity lymphedema Neuro: nonfocal, well oriented, appropriate affect Breasts: Status post right lumpectomy and radiation.  No evidence of disease recurrence.  Left breast benign.  Both axillae are benign.  LAB RESULTS:  CMP     Component Value Date/Time   NA 138 07/09/2019 1551   NA 143 05/09/2017 1454   K 4.0 07/09/2019 1551   K 3.6 05/09/2017 1454    CL 105 07/09/2019 1551   CO2 23 07/09/2019 1551   CO2 27 05/09/2017 1454   GLUCOSE 92 07/09/2019 1551   GLUCOSE 93 05/09/2017 1454   BUN 13 07/09/2019 1551   BUN 13.5 05/09/2017 1454   CREATININE 0.75 07/09/2019 1551   CREATININE 0.7 05/09/2017 1454   CALCIUM 9.0 07/09/2019 1551   CALCIUM 9.4 05/09/2017 1454   PROT 7.4 07/09/2019 1551   PROT 6.9 05/09/2017 1454   ALBUMIN 3.9 07/09/2019 1551   ALBUMIN 3.6 05/09/2017 1454   AST 19 07/09/2019 1551   AST 11 05/09/2017 1454   ALT  21 07/09/2019 1551   ALT 11 05/09/2017 1454   ALKPHOS 47 07/09/2019 1551   ALKPHOS 37 (L) 05/09/2017 1454   BILITOT 0.4 07/09/2019 1551   BILITOT 0.31 05/09/2017 1454   GFRNONAA >60 07/09/2019 1551   GFRAA >60 07/09/2019 1551    INo results found for: SPEP, UPEP  Lab Results  Component Value Date   WBC 7.0 07/09/2019   NEUTROABS 4.3 07/09/2019   HGB 11.6 (L) 07/09/2019   HCT 36.1 07/09/2019   MCV 90.0 07/09/2019   PLT 263 07/09/2019      Chemistry      Component Value Date/Time   NA 138 07/09/2019 1551   NA 143 05/09/2017 1454   K 4.0 07/09/2019 1551   K 3.6 05/09/2017 1454   CL 105 07/09/2019 1551   CO2 23 07/09/2019 1551   CO2 27 05/09/2017 1454   BUN 13 07/09/2019 1551   BUN 13.5 05/09/2017 1454   CREATININE 0.75 07/09/2019 1551   CREATININE 0.7 05/09/2017 1454      Component Value Date/Time   CALCIUM 9.0 07/09/2019 1551   CALCIUM 9.4 05/09/2017 1454   ALKPHOS 47 07/09/2019 1551   ALKPHOS 37 (L) 05/09/2017 1454   AST 19 07/09/2019 1551   AST 11 05/09/2017 1454   ALT 21 07/09/2019 1551   ALT 11 05/09/2017 1454   BILITOT 0.4 07/09/2019 1551   BILITOT 0.31 05/09/2017 1454      No results found for: LABCA2  No components found for: LABCA125  No results for input(s): INR in the last 168 hours.  Urinalysis    Component Value Date/Time   COLORURINE YELLOW 07/21/2017 1320   APPEARANCEUR HAZY (A) 07/21/2017 1320   LABSPEC 1.016 07/21/2017 1320   PHURINE 5.0 07/21/2017  1320   GLUCOSEU NEGATIVE 07/21/2017 1320   HGBUR MODERATE (A) 07/21/2017 1320   BILIRUBINUR n 06/29/2019 1442   KETONESUR 5 (A) 07/21/2017 1320   PROTEINUR Negative 06/29/2019 1442   PROTEINUR NEGATIVE 07/21/2017 1320   UROBILINOGEN 0.2 06/29/2019 1442   NITRITE 1+ 06/29/2019 1442   NITRITE POSITIVE (A) 07/21/2017 1320   LEUKOCYTESUR Small (1+) (A) 06/29/2019 1442    STUDIES: No results found.   ASSESSMENT: 68 y.o. [place] woman status post left breast lower outer quadrant biopsy 05/03/2015 for a clinical T1b N0, stage IA invasive ductal carcinoma, grade 1, estrogen receptor strongly positive, progesterone receptor minimally positive, with an MIB-1 of 5% and no HER-2 amplification.  (1) left lumpectomy and sentinel lymph node sampling 05/30/2015 showed a pT1b pN0, stage IA invasive ductal carcinoma, grade 1, with close but negative margins, and repeat HER-2 again negative  (2) Oncotype score of 20 (intermediate risk) predicts a 10 year risk of recurrence outside the breast of 13% if the patient's only systemic therapy is tamoxifen for 5 years.   (a) the patient opted against chemotherapy given the very marginal benefits in her situation  (3) adjuvant radiation 07/07/2015-08/09/2015:  Left breast/ 42.72 Gy at 2.67 Gy per fraction x 16 fractions.  Left breast boost/ 10 Gy at 2 Gy per fraction x 5 fractions  (4) tamoxifen started 11/20/2015  (5) genetics testing 09/05/2015 through the [place] gene panel performed by [facility] offers found no deleterious mutations in BRCA1 c.68_69delAG (also known as 185delAG or 187delAGE), BRCA1 c.5266dupC (also known as 5382insC or 5385insC) and BRCA2 c.5946delT (also known as 6174delT).   PLAN: Josephyne is now 4 years out from definitive surgery for her breast cancer with no evidence of disease  recurrence.  This is very favorable.  She is tolerating tamoxifen well and the plan is to continue that a total of 5 years.  She will be moving  to [place] permanently next month.  Today she "graduated" from follow-up here and I gave her a copy of her diagnostic and treatment summary, her most recent mammogram and of course my card in case her new oncologist wants to contact us  I will be glad to see her again at any point in the future if and when the need arises.   Magrinat, Virgie Dad, MD  07/09/19 5:52 PM Medical Oncology and Hematology [facility] [address] [place], [place] [address] Tel. [phone]    Fax. [phone]   I, Jacqualyn Posey am acting as a Education administrator for Chauncey Cruel, MD.   I, Lurline Del MD, have reviewed the above documentation for accuracy and completeness, and I agree with the above.

## 2019-07-09 ENCOUNTER — Inpatient Hospital Stay (HOSPITAL_BASED_OUTPATIENT_CLINIC_OR_DEPARTMENT_OTHER): Payer: 59 | Admitting: Oncology

## 2019-07-09 ENCOUNTER — Other Ambulatory Visit: Payer: Self-pay

## 2019-07-09 ENCOUNTER — Inpatient Hospital Stay: Payer: 59 | Attending: Oncology

## 2019-07-09 VITALS — BP 133/72 | HR 72 | Temp 98.9°F | Resp 18 | Ht 66.25 in | Wt 167.8 lb

## 2019-07-09 DIAGNOSIS — Z79899 Other long term (current) drug therapy: Secondary | ICD-10-CM | POA: Diagnosis not present

## 2019-07-09 DIAGNOSIS — Z923 Personal history of irradiation: Secondary | ICD-10-CM | POA: Insufficient documentation

## 2019-07-09 DIAGNOSIS — C50512 Malignant neoplasm of lower-outer quadrant of left female breast: Secondary | ICD-10-CM | POA: Insufficient documentation

## 2019-07-09 DIAGNOSIS — I10 Essential (primary) hypertension: Secondary | ICD-10-CM | POA: Diagnosis not present

## 2019-07-09 DIAGNOSIS — Z170 Estrogen receptor positive status [ER+]: Secondary | ICD-10-CM | POA: Diagnosis not present

## 2019-07-09 DIAGNOSIS — Z79810 Long term (current) use of selective estrogen receptor modulators (SERMs): Secondary | ICD-10-CM | POA: Diagnosis not present

## 2019-07-09 DIAGNOSIS — Z803 Family history of malignant neoplasm of breast: Secondary | ICD-10-CM | POA: Diagnosis not present

## 2019-07-09 LAB — CBC WITH DIFFERENTIAL (CANCER CENTER ONLY)
Abs Immature Granulocytes: 0.01 10*3/uL (ref 0.00–0.07)
Basophils Absolute: 0.1 10*3/uL (ref 0.0–0.1)
Basophils Relative: 1 %
Eosinophils Absolute: 0.1 10*3/uL (ref 0.0–0.5)
Eosinophils Relative: 2 %
HCT: 36.1 % (ref 36.0–46.0)
Hemoglobin: 11.6 g/dL — ABNORMAL LOW (ref 12.0–15.0)
Immature Granulocytes: 0 %
Lymphocytes Relative: 31 %
Lymphs Abs: 2.2 10*3/uL (ref 0.7–4.0)
MCH: 28.9 pg (ref 26.0–34.0)
MCHC: 32.1 g/dL (ref 30.0–36.0)
MCV: 90 fL (ref 80.0–100.0)
Monocytes Absolute: 0.4 10*3/uL (ref 0.1–1.0)
Monocytes Relative: 6 %
Neutro Abs: 4.3 10*3/uL (ref 1.7–7.7)
Neutrophils Relative %: 60 %
Platelet Count: 263 10*3/uL (ref 150–400)
RBC: 4.01 MIL/uL (ref 3.87–5.11)
RDW: 13.3 % (ref 11.5–15.5)
WBC Count: 7 10*3/uL (ref 4.0–10.5)
nRBC: 0 % (ref 0.0–0.2)

## 2019-07-09 LAB — CMP (CANCER CENTER ONLY)
ALT: 21 U/L (ref 0–44)
AST: 19 U/L (ref 15–41)
Albumin: 3.9 g/dL (ref 3.5–5.0)
Alkaline Phosphatase: 47 U/L (ref 38–126)
Anion gap: 10 (ref 5–15)
BUN: 13 mg/dL (ref 8–23)
CO2: 23 mmol/L (ref 22–32)
Calcium: 9 mg/dL (ref 8.9–10.3)
Chloride: 105 mmol/L (ref 98–111)
Creatinine: 0.75 mg/dL (ref 0.44–1.00)
GFR, Est AFR Am: >60 mL/min (ref >60)
GFR, Estimated: >60 mL/min (ref >60)
Glucose, Bld: 92 mg/dL (ref 70–99)
Potassium: 4 mmol/L (ref 3.5–5.1)
Sodium: 138 mmol/L (ref 135–145)
Total Bilirubin: 0.4 mg/dL (ref 0.3–1.2)
Total Protein: 7.4 g/dL (ref 6.5–8.1)

## 2019-07-09 MED ORDER — TAMOXIFEN CITRATE 20 MG PO TABS: 20.0000 mg | ORAL_TABLET | Freq: Every day | ORAL | 4 refills | Status: DC

## 2019-07-20 DIAGNOSIS — Z23 Encounter for immunization: Secondary | ICD-10-CM | POA: Diagnosis not present

## 2019-08-14 ENCOUNTER — Encounter: Payer: Self-pay | Admitting: Family Medicine

## 2019-08-14 ENCOUNTER — Telehealth (INDEPENDENT_AMBULATORY_CARE_PROVIDER_SITE_OTHER): Payer: Medicare Other | Admitting: Family Medicine

## 2019-08-14 ENCOUNTER — Telehealth: Payer: Medicare Other | Admitting: Family Medicine

## 2019-08-14 DIAGNOSIS — B373 Candidiasis of vulva and vagina: Secondary | ICD-10-CM | POA: Diagnosis not present

## 2019-08-14 DIAGNOSIS — B3731 Acute candidiasis of vulva and vagina: Secondary | ICD-10-CM

## 2019-08-14 MED ORDER — FLUCONAZOLE 150 MG PO TABS: 150.0000 mg | ORAL_TABLET | ORAL | 2 refills | Status: AC | PRN

## 2019-08-14 NOTE — Progress Notes (Signed)
Virtual Visit via Video Note  I connected with the patient on 08/14/19 at  9:15 AM EDT by a video enabled telemedicine application and verified that I am speaking with the correct person using two identifiers.  Location patient: home Location provider:work or home office Persons participating in the virtual visit: patient, provider  I discussed the limitations of evaluation and management by telemedicine and the availability of in person appointments. The patient expressed understanding and agreed to proceed.   HPI: Here for one week of typical yeast infection symptoms like a white DC and itching. OTC products do not help. She had one of these in May and that responded well to Diflucan. These infections seem to be more frequent for her since starting on Tamoxifen.    ROS: See pertinent positives and negatives per HPI.  Past Medical History:  Diagnosis Date  . Breast cancer ([place]) DX 05/03/15---  oncologiist-  dr [name]--- per LOV note -- no recurrence   Stage 1A ( T1b, N0, M0), Grade 1,  ER/PR +,  invasive DCIS left outer quadrant--  05-30-2015  s/p  left partial mastectomy and snl dissection x1/  completed radiation 08-09-2015   . Family history of breast cancer   . GERD (gastroesophageal reflux disease)   . Hiatal hernia   . History of adenomatous polyp of colon    03-04-2015  tubular adenom's  . History of radiation therapy 07-07-2015  to 08-09-2015   left breast 42.72Gy @ 2.67Gy per 16 fractions and boost 10Gy @ 2Gy per 5 fractions  . 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 History of sepsis 07/21/2017   left hydronephrosis due to left ureter stone obstruction  . Hot flashes   . Hypertension   . Left ureteral stone   . Personal history of radiation therapy   . Wears glasses     Past Surgical History:  Procedure Laterality Date  . BREAST BIOPSY Left 05/03/2015   U/S Core- Benign  . BREAST BIOPSY Right    U/S  Core- Benign  . BREAST LUMPECTOMY Left   . CHOLECYSTECTOMY OPEN  2005  . COLONOSCOPY WITH ESOPHAGOGASTRODUODENOSCOPY (EGD)  03-04-2015   dr [name]  . CYSTOSCOPY WITH RETROGRADE PYELOGRAM, URETEROSCOPY AND STENT PLACEMENT Left 07/21/2017   Procedure: CYSTOSCOPY WITH RETROGRADE PYELOGRAM, URETEROSCOPY AND STENT PLACEMENT;  Surgeon: [name], MD;  Location: [facility];  Service: Urology;  Laterality: Left;  . CYSTOSCOPY WITH RETROGRADE PYELOGRAM, URETEROSCOPY AND STENT PLACEMENT Left 08/16/2017   Procedure: CYSTOSCOPY WITH RETROGRADE PYELOGRAM, URETEROSCOPY , LASER LITHOTRIPSY, STONE BASKETRY AND STENT REPLACEMENT;  Surgeon: [name], MD;  Location: [facility];  Service: Urology;  Laterality: Left;  . DILATATION & CURETTAGE/HYSTEROSCOPY WITH MYOSURE N/A 02/05/2017   Procedure: DILATATION & CURETTAGE/HYSTEROSCOPY WITH MYOSURE;  Surgeon: [name], MD;  Location[facility];  Service: Gynecology;  Laterality: N/A;  . GASTRIC RESECTION  2006   wedge resection for two GIST tumors (benign)  . HOLMIUM LASER APPLICATION Left [id number]   Procedure: HOLMIUM LASER APPLICATION;  Surgeon: [name], MD;  Location: [facility];  Service: Urology;  Laterality: Left;  . LUMBAR LAMINECTOMY  2000;  2009  . RADIOACTIVE SEED GUIDED PARTIAL MASTECTOMY WITH AXILLARY SENTINEL LYMPH NODE BIOPSY Left 05/30/2015   Procedure: LEFT PARTIAL MASTECTOMY WITH RADIOACTIVE SEED LOCALIZATION, LEFT AXILLARY SENTINEL LYMPH NODE BIOPSY;  Surgeon: Fanny Skates, MD;  Location:  [facility];  Service: General;  Laterality: Left;  . TRANSTHORACIC ECHOCARDIOGRAM  07/06/2010   normal echo; ef 70%    Family History  Problem Relation Age of Onset  . Breast cancer Mother 29  . Stroke Father   . Heart disease Maternal Grandmother   . Cervical cancer Maternal Aunt   . Colon cancer Neg Hx   . Colon polyps Neg Hx   . Diabetes Neg Hx   . Kidney  disease Neg Hx   . Esophageal cancer Neg Hx   . Gallbladder disease Neg Hx      Current Outpatient Medications:  .  amLODipine (NORVASC) 5 MG tablet, TAKE 1 TABLET BY MOUTH DAILY, Disp: 90 tablet, Rfl: 0 .  Biotin 1000 MCG CHEW, Chew 5,000 mg by mouth daily., Disp: , Rfl:  .  esomeprazole (NEXIUM) 40 MG capsule, Take 1 capsule (40 mg total) by mouth daily., Disp: 90 capsule, Rfl: 1 .  fluconazole (DIFLUCAN) 150 MG tablet, Take 1 tablet (150 mg total) by mouth as needed., Disp: 2 tablet, Rfl: 2 .  gabapentin (NEURONTIN) 300 MG capsule, Take 2 capsules (600 mg total) by mouth at bedtime., Disp: 90 capsule, Rfl: 4 .  losartan (COZAAR) 50 MG tablet, Take 1 tablet (50 mg total) by mouth daily., Disp: 90 tablet, Rfl: 1 .  tamoxifen (NOLVADEX) 20 MG tablet, Take 1 tablet (20 mg total) by mouth daily., Disp: 90 tablet, Rfl: 4  Current Facility-Administered Medications:  .  0.9 %  sodium chloride infusion, 500 mL, Intravenous, Once, Milus Banister, MD  EXAM:  VITALS per patient if applicable:  GENERAL: alert, oriented, appears well and in no acute distress  HEENT: atraumatic, conjunttiva clear, no obvious abnormalities on inspection of external nose and ears  NECK: normal movements of the head and neck  LUNGS: on inspection no signs of respiratory distress, breathing rate appears normal, no obvious gross SOB, gasping or wheezing  CV: no obvious cyanosis  MS: moves all visible extremities without noticeable abnormality  PSYCH/NEURO: pleasant and cooperative, no obvious depression or anxiety, speech and thought processing grossly intact  ASSESSMENT AND PLAN: Yeast vaginitis, treat with Diflucan. Recheck prn.  Alysia Penna, MD  Discussed the following assessment and plan:  No diagnosis found.     I discussed the assessment and treatment plan with the patient. The patient was provided an opportunity to ask questions and all were answered. The patient agreed with the plan and  demonstrated an understanding of the instructions.   The patient was advised to call back or seek an in-person evaluation if the symptoms worsen or if the condition fails to improve as anticipated.

## 2019-09-07 ENCOUNTER — Other Ambulatory Visit: Payer: Medicare Other

## 2019-09-07 ENCOUNTER — Ambulatory Visit: Payer: Medicare Other | Admitting: Oncology

## 2019-09-11 ENCOUNTER — Other Ambulatory Visit: Payer: Self-pay | Admitting: Family Medicine

## 2019-09-11 MED ORDER — LOSARTAN POTASSIUM 50 MG PO TABS: 50.0000 mg | ORAL_TABLET | Freq: Every day | ORAL | 1 refills | Status: AC

## 2019-09-11 NOTE — Telephone Encounter (Signed)
Copied from [place] [id number]. Topic: Quick Communication - Rx Refill/Question >> Sep 11, 2019 10:25 AM Yvette Rack wrote: Medication: losartan (COZAAR) 50 MG tablet  Has the patient contacted their pharmacy? yes   Preferred Pharmacy (with phone number or street name): [facility] [place], [place] [phone] (Phone) [phone] (Fax)  Agent: Please be advised that RX refills may take up to 3 business days. We ask that you follow-up with your pharmacy.

## 2019-10-03 ENCOUNTER — Other Ambulatory Visit: Payer: Self-pay | Admitting: Oncology

## 2019-12-02 ENCOUNTER — Other Ambulatory Visit: Payer: Self-pay | Admitting: *Deleted

## 2019-12-02 MED ORDER — GABAPENTIN 300 MG PO CAPS: 600.0000 mg | ORAL_CAPSULE | Freq: Every day | ORAL | 4 refills | Status: AC

## 2019-12-03 ENCOUNTER — Telehealth: Payer: Self-pay | Admitting: *Deleted

## 2019-12-03 NOTE — Telephone Encounter (Signed)
Copied from [place] [id number]. Topic: General - Other >> Dec 03, 2019 12:00 PM Greggory Keen D wrote: Reason for CRM: Pt is in [place] now and she believes she has a yeast infection.  She wants to know if Dr. Volanda Napoleon would call in the 2 pills that she has sent to the pharmacy before for her.  [facility] [place]  # [phone]  Pt's CB# [phone]

## 2019-12-03 NOTE — Telephone Encounter (Signed)
FYI Spoke with pt advised to go to UC/ED close to her for testing, pt verbalized understanding state that she will go tomorrow.

## 2019-12-21 ENCOUNTER — Other Ambulatory Visit: Payer: Self-pay | Admitting: Family Medicine

## 2020-08-15 ENCOUNTER — Telehealth: Payer: Self-pay | Admitting: Oncology

## 2020-08-15 NOTE — Telephone Encounter (Signed)
RNHAFBX:[id number] Faxed medical records to [facility] @ fax [phone]
